# Patient Record
Sex: Female | Born: 1940 | Race: White | Hispanic: No | Marital: Married | Smoking: Never smoker
Health system: Southern US, Community
[De-identification: ages and names within clinical notes are randomized; demographics above are authoritative.]

## PROBLEM LIST (undated history)

## (undated) DIAGNOSIS — Z950 Presence of cardiac pacemaker: Secondary | ICD-10-CM

## (undated) DIAGNOSIS — R011 Cardiac murmur, unspecified: Secondary | ICD-10-CM

## (undated) DIAGNOSIS — M069 Rheumatoid arthritis, unspecified: Secondary | ICD-10-CM

## (undated) DIAGNOSIS — I4430 Unspecified atrioventricular block: Secondary | ICD-10-CM

## (undated) DIAGNOSIS — M797 Fibromyalgia: Secondary | ICD-10-CM

## (undated) DIAGNOSIS — S2239XA Fracture of one rib, unspecified side, initial encounter for closed fracture: Secondary | ICD-10-CM

## (undated) DIAGNOSIS — M1990 Unspecified osteoarthritis, unspecified site: Secondary | ICD-10-CM

## (undated) DIAGNOSIS — I10 Essential (primary) hypertension: Secondary | ICD-10-CM

## (undated) DIAGNOSIS — K219 Gastro-esophageal reflux disease without esophagitis: Secondary | ICD-10-CM

## (undated) DIAGNOSIS — Z8489 Family history of other specified conditions: Secondary | ICD-10-CM

## (undated) DIAGNOSIS — M810 Age-related osteoporosis without current pathological fracture: Secondary | ICD-10-CM

## (undated) DIAGNOSIS — I2510 Atherosclerotic heart disease of native coronary artery without angina pectoris: Secondary | ICD-10-CM

## (undated) DIAGNOSIS — F329 Major depressive disorder, single episode, unspecified: Secondary | ICD-10-CM

## (undated) DIAGNOSIS — I341 Nonrheumatic mitral (valve) prolapse: Secondary | ICD-10-CM

## (undated) DIAGNOSIS — Z87442 Personal history of urinary calculi: Secondary | ICD-10-CM

## (undated) DIAGNOSIS — F32A Depression, unspecified: Secondary | ICD-10-CM

## (undated) DIAGNOSIS — C439 Malignant melanoma of skin, unspecified: Secondary | ICD-10-CM

## (undated) HISTORY — DX: Depression, unspecified: F32.A

## (undated) HISTORY — PX: MELANOMA EXCISION: SHX5266

## (undated) HISTORY — PX: CARDIAC CATHETERIZATION: SHX172

## (undated) HISTORY — DX: Fracture of one rib, unspecified side, initial encounter for closed fracture: S22.39XA

## (undated) HISTORY — DX: Major depressive disorder, single episode, unspecified: F32.9

## (undated) HISTORY — PX: CARDIOVASCULAR STRESS TEST: SHX262

## (undated) HISTORY — DX: Nonrheumatic mitral (valve) prolapse: I34.1

## (undated) HISTORY — DX: Age-related osteoporosis without current pathological fracture: M81.0

## (undated) HISTORY — DX: Malignant melanoma of skin, unspecified: C43.9

## (undated) HISTORY — PX: CATARACT EXTRACTION W/ INTRAOCULAR LENS  IMPLANT, BILATERAL: SHX1307

## (undated) HISTORY — DX: Fibromyalgia: M79.7

## (undated) HISTORY — PX: COLONOSCOPY: SHX174

---

## 1997-12-23 ENCOUNTER — Other Ambulatory Visit: Admission: RE | Admit: 1997-12-23 | Discharge: 1997-12-23 | Payer: Self-pay | Admitting: Obstetrics and Gynecology

## 1999-02-05 ENCOUNTER — Other Ambulatory Visit: Admission: RE | Admit: 1999-02-05 | Discharge: 1999-02-05 | Payer: Self-pay | Admitting: Obstetrics and Gynecology

## 2000-02-15 ENCOUNTER — Other Ambulatory Visit: Admission: RE | Admit: 2000-02-15 | Discharge: 2000-02-15 | Payer: Self-pay | Admitting: Obstetrics and Gynecology

## 2001-02-20 ENCOUNTER — Other Ambulatory Visit: Admission: RE | Admit: 2001-02-20 | Discharge: 2001-02-20 | Payer: Self-pay | Admitting: Obstetrics and Gynecology

## 2002-02-26 ENCOUNTER — Other Ambulatory Visit: Admission: RE | Admit: 2002-02-26 | Discharge: 2002-02-26 | Payer: Self-pay | Admitting: Obstetrics and Gynecology

## 2002-11-09 ENCOUNTER — Encounter: Admission: RE | Admit: 2002-11-09 | Discharge: 2002-11-09 | Payer: Self-pay | Admitting: Family Medicine

## 2002-11-09 ENCOUNTER — Encounter: Payer: Self-pay | Admitting: Family Medicine

## 2002-12-24 ENCOUNTER — Encounter: Admission: RE | Admit: 2002-12-24 | Discharge: 2002-12-24 | Payer: Self-pay | Admitting: Internal Medicine

## 2002-12-24 ENCOUNTER — Encounter: Payer: Self-pay | Admitting: Internal Medicine

## 2003-03-18 ENCOUNTER — Other Ambulatory Visit: Admission: RE | Admit: 2003-03-18 | Discharge: 2003-03-18 | Payer: Self-pay | Admitting: Obstetrics and Gynecology

## 2003-03-27 ENCOUNTER — Emergency Department (HOSPITAL_COMMUNITY): Admission: EM | Admit: 2003-03-27 | Discharge: 2003-03-28 | Payer: Self-pay | Admitting: Emergency Medicine

## 2003-03-30 ENCOUNTER — Ambulatory Visit (HOSPITAL_COMMUNITY): Admission: RE | Admit: 2003-03-30 | Discharge: 2003-03-30 | Payer: Self-pay | Admitting: Internal Medicine

## 2003-06-02 ENCOUNTER — Encounter: Admission: RE | Admit: 2003-06-02 | Discharge: 2003-06-02 | Payer: Self-pay | Admitting: Internal Medicine

## 2003-09-27 ENCOUNTER — Encounter: Admission: RE | Admit: 2003-09-27 | Discharge: 2003-09-27 | Payer: Self-pay | Admitting: Obstetrics and Gynecology

## 2003-11-14 HISTORY — PX: BREAST BIOPSY: SHX20

## 2003-12-02 ENCOUNTER — Encounter (INDEPENDENT_AMBULATORY_CARE_PROVIDER_SITE_OTHER): Payer: Self-pay | Admitting: Specialist

## 2003-12-02 ENCOUNTER — Ambulatory Visit (HOSPITAL_BASED_OUTPATIENT_CLINIC_OR_DEPARTMENT_OTHER): Admission: RE | Admit: 2003-12-02 | Discharge: 2003-12-02 | Payer: Self-pay | Admitting: Surgery

## 2003-12-02 ENCOUNTER — Ambulatory Visit (HOSPITAL_COMMUNITY): Admission: RE | Admit: 2003-12-02 | Discharge: 2003-12-02 | Payer: Self-pay | Admitting: Surgery

## 2004-03-30 ENCOUNTER — Other Ambulatory Visit: Admission: RE | Admit: 2004-03-30 | Discharge: 2004-03-30 | Payer: Self-pay | Admitting: Obstetrics and Gynecology

## 2005-01-15 ENCOUNTER — Encounter: Admission: RE | Admit: 2005-01-15 | Discharge: 2005-01-15 | Payer: Self-pay | Admitting: Family Medicine

## 2005-03-29 ENCOUNTER — Other Ambulatory Visit: Admission: RE | Admit: 2005-03-29 | Discharge: 2005-03-29 | Payer: Self-pay | Admitting: Obstetrics and Gynecology

## 2005-07-03 ENCOUNTER — Ambulatory Visit: Admission: RE | Admit: 2005-07-03 | Discharge: 2005-07-03 | Payer: Self-pay | Admitting: Obstetrics and Gynecology

## 2005-07-05 ENCOUNTER — Encounter: Admission: RE | Admit: 2005-07-05 | Discharge: 2005-07-05 | Payer: Self-pay | Admitting: Family Medicine

## 2005-09-26 ENCOUNTER — Encounter: Admission: RE | Admit: 2005-09-26 | Discharge: 2005-09-26 | Payer: Self-pay | Admitting: Family Medicine

## 2006-04-11 ENCOUNTER — Other Ambulatory Visit: Admission: RE | Admit: 2006-04-11 | Discharge: 2006-04-11 | Payer: Self-pay | Admitting: Obstetrics & Gynecology

## 2007-04-13 ENCOUNTER — Other Ambulatory Visit: Admission: RE | Admit: 2007-04-13 | Discharge: 2007-04-13 | Payer: Self-pay | Admitting: Obstetrics and Gynecology

## 2008-05-31 ENCOUNTER — Other Ambulatory Visit: Admission: RE | Admit: 2008-05-31 | Discharge: 2008-05-31 | Payer: Self-pay | Admitting: Obstetrics & Gynecology

## 2009-05-09 ENCOUNTER — Emergency Department (HOSPITAL_COMMUNITY): Admission: EM | Admit: 2009-05-09 | Discharge: 2009-05-09 | Payer: Self-pay | Admitting: Emergency Medicine

## 2009-12-14 ENCOUNTER — Ambulatory Visit: Payer: Self-pay | Admitting: Cardiology

## 2010-07-02 LAB — URINALYSIS, ROUTINE W REFLEX MICROSCOPIC
Bilirubin Urine: NEGATIVE
Glucose, UA: NEGATIVE mg/dL
Ketones, ur: NEGATIVE mg/dL
Leukocytes, UA: NEGATIVE
Nitrite: NEGATIVE
Protein, ur: NEGATIVE mg/dL
Specific Gravity, Urine: 1.021 (ref 1.005–1.030)
Urobilinogen, UA: 0.2 mg/dL (ref 0.0–1.0)
pH: 6 (ref 5.0–8.0)

## 2010-07-02 LAB — COMPREHENSIVE METABOLIC PANEL
ALT: 17 U/L (ref 0–35)
AST: 32 U/L (ref 0–37)
Albumin: 3.9 g/dL (ref 3.5–5.2)
Alkaline Phosphatase: 55 U/L (ref 39–117)
BUN: 18 mg/dL (ref 6–23)
CO2: 24 mEq/L (ref 19–32)
Calcium: 9.2 mg/dL (ref 8.4–10.5)
Chloride: 106 mEq/L (ref 96–112)
Creatinine, Ser: 0.75 mg/dL (ref 0.4–1.2)
GFR calc Af Amer: >60 mL/min (ref >60)
GFR calc non Af Amer: >60 mL/min (ref >60)
Glucose, Bld: 123 mg/dL — ABNORMAL HIGH (ref 70–99)
Potassium: 4.1 mEq/L (ref 3.5–5.1)
Sodium: 140 mEq/L (ref 135–145)
Total Bilirubin: 1.1 mg/dL (ref 0.3–1.2)
Total Protein: 7.4 g/dL (ref 6.0–8.3)

## 2010-07-02 LAB — CBC
HCT: 44.4 % (ref 36.0–46.0)
Hemoglobin: 15 g/dL (ref 12.0–15.0)
MCHC: 33.8 g/dL (ref 30.0–36.0)
MCV: 89.5 fL (ref 78.0–100.0)
Platelets: 188 10*3/uL (ref 150–400)
RBC: 4.96 MIL/uL (ref 3.87–5.11)
RDW: 12.9 % (ref 11.5–15.5)
WBC: 6.2 10*3/uL (ref 4.0–10.5)

## 2010-07-02 LAB — DIFFERENTIAL
Basophils Absolute: 0 10*3/uL (ref 0.0–0.1)
Basophils Relative: 1 % (ref 0–1)
Eosinophils Absolute: 0.1 10*3/uL (ref 0.0–0.7)
Eosinophils Relative: 2 % (ref 0–5)
Lymphocytes Relative: 46 % (ref 12–46)
Lymphs Abs: 2.9 10*3/uL (ref 0.7–4.0)
Monocytes Absolute: 0.6 10*3/uL (ref 0.1–1.0)
Monocytes Relative: 10 % (ref 3–12)
Neutro Abs: 2.6 10*3/uL (ref 1.7–7.7)
Neutrophils Relative %: 42 % — ABNORMAL LOW (ref 43–77)

## 2010-07-02 LAB — WET PREP, GENITAL
Clue Cells Wet Prep HPF POC: NONE SEEN
Trich, Wet Prep: NONE SEEN
Yeast Wet Prep HPF POC: NONE SEEN

## 2010-07-02 LAB — URINE CULTURE
Colony Count: NO GROWTH
Culture: NO GROWTH

## 2010-07-02 LAB — URINE MICROSCOPIC-ADD ON

## 2010-07-02 LAB — LIPASE, BLOOD: Lipase: 44 U/L (ref 11–59)

## 2010-08-31 NOTE — Op Note (Signed)
[name], Elizabeth                         ACCOUNT NO.:  [id number]   MEDICAL RECORD NO.:  [id number]                   PATIENT TYPE:  AMB   LOCATION:  DSC                                  FACILITY:  [facility]   PHYSICIAN:  Sandria Bales. Ezzard Standing, M.D.               DATE OF BIRTH:  [birth year]   DATE OF PROCEDURE:  12/02/2003  DATE OF DISCHARGE:                                 OPERATIVE REPORT   PREOPERATIVE DIAGNOSIS:  A 1 cm mass at the edge of left areola at the 3  o'clock position.   POSTOPERATIVE DIAGNOSIS:  A 1 cm mass at the edge of left areola at the 3  o'clock position.   OPERATION:  Excision of left breast mass.   SURGEON:  Sandria Bales. Ezzard Standing, M.D.   FIRST ASSISTANT:  None.   ANESTHESIA:  Approximately 60 mL of 1% Xylocaine.   COMPLICATIONS:  None.   INDICATIONS FOR PROCEDURE:  Elizabeth Estrada is a 70 year old white female who  has had a mass in her left breast at the 3 o'clock position that is right on  the edge of the areola.  This has gotten larger and then gotten smaller and  appears to be a superficial cyst or abscess that has partially resolved.  [facility] which was negative for  any clear parenchymal disease.  I discussed with the patient that I felt she  would be best having this excised, it is probably some infected duct or cyst  that will probably keep recurring and she now comes for excision of this.   DESCRIPTION OF PROCEDURE:  With the patient in the supine position, her left  breast was prepped with Betadine solution and sterilely draped.  Using 1%  Xylocaine with epinephrine, I used about 6 mL to inject as a local  anesthetic.  I excised the cyst and excised a piece of the skin over the  cyst along the 3 o'clock position of her areola on her left breast.  This  mass was superficial, really more on the skin than it was in the breast  tissue.  I sent it to pathology.  There was no other palpable mass.  I  closed the wound with  5-0 Vicryl suture, painted it with tincture of Benzoin  and placed Steri-Strips. She will be seen back in two weeks for follow up on  pathology and check her wound.                                               Sandria Bales. Ezzard Standing, M.D.   DHN/MEDQ  D:  12/02/2003  T:  12/03/2003  Job:  [id number]   cc:   Laqueta Linden, M.D.  [address]  [place]  [place] [address]  Fax: [phone]

## 2011-05-15 ENCOUNTER — Telehealth: Payer: Self-pay | Admitting: Cardiology

## 2011-05-15 ENCOUNTER — Encounter: Payer: Self-pay | Admitting: Nurse Practitioner

## 2011-05-15 NOTE — Telephone Encounter (Signed)
Patient called stating she has been having chest tightness off and on for the past 2 to 3 weeks.Appointment scheduled with Norma Fredrickson NP 2/1 13 at 9:30 am.

## 2011-05-15 NOTE — Telephone Encounter (Signed)
New msg: Pt calling c/o chest pressure off and on for the past week. Pt not c/o any other symptoms. Please return pt call to discuss further.

## 2011-05-17 ENCOUNTER — Ambulatory Visit (INDEPENDENT_AMBULATORY_CARE_PROVIDER_SITE_OTHER): Payer: BC Managed Care – PPO | Admitting: Nurse Practitioner

## 2011-05-17 ENCOUNTER — Encounter: Payer: Self-pay | Admitting: Nurse Practitioner

## 2011-05-17 VITALS — BP 138/74 | HR 61 | Ht 62.0 in | Wt 121.8 lb

## 2011-05-17 DIAGNOSIS — I341 Nonrheumatic mitral (valve) prolapse: Secondary | ICD-10-CM

## 2011-05-17 DIAGNOSIS — R0789 Other chest pain: Secondary | ICD-10-CM | POA: Insufficient documentation

## 2011-05-17 DIAGNOSIS — I050 Rheumatic mitral stenosis: Secondary | ICD-10-CM | POA: Insufficient documentation

## 2011-05-17 DIAGNOSIS — R079 Chest pain, unspecified: Secondary | ICD-10-CM

## 2011-05-17 DIAGNOSIS — I059 Rheumatic mitral valve disease, unspecified: Secondary | ICD-10-CM

## 2011-05-17 NOTE — Assessment & Plan Note (Signed)
Patient presents with midsternal chest pain. Not exertional in nature. Does have positive family history with her parents having CAD in their 42's. She does have MVP. Will update her echo and arrange for stress echo as well. Further disposition to follow. She is on aspirin therapy. No other changes in her medicines today. Patient is agreeable to this plan and will call if any problems develop in the interim.

## 2011-05-17 NOTE — Progress Notes (Signed)
   Elizabeth Estrada Date of Birth: [birth year] Medical Record #[id number]  History of Present Illness: Elizabeth Estrada is seen today for a work in visit. She is seen for Dr. [place]. She has a history of MVP. She has not been seen since 2011. She comes in today because of concerns of chest pain. She notes that for the past few weeks her chest feels tight. It is in the midsternal region. It will just come on and then go away. No real pattern. No aggravating or alleviating factors. She denies being short of breath, nauseated or diaphoretic. No radiation of the discomfort. It will last for less than 5 minutes. Not exertional in nature but she admits that she has not been as active due to the weather. She continues to have some occasional palpitations and dizzy spells. Her family history is positive for CAD. She never really smoked. She has had cholesterol levels done back in November and those readings were ok. She is on low dose aspirin. She had remote stress testing in 2001 and last echo in 2006.  Current Outpatient Prescriptions on File Prior to Visit  Medication Sig Dispense Refill  . aspirin 81 MG tablet Take 81 mg by mouth every other day.       Marland Kitchen CALCIUM PO Take by mouth daily.      Marland Kitchen escitalopram (LEXAPRO) 10 MG tablet Take 10 mg by mouth daily.      Marland Kitchen etanercept (ENBREL) 50 MG/ML injection Inject 50 mg into the skin once a week.      . Multiple Vitamin (MULTIVITAMIN) tablet Take 1 tablet by mouth daily.        No Known Allergies  Past Medical History  Diagnosis Date  . MVP (mitral valve prolapse)   . Rheumatoid arthritis     Past Surgical History  Procedure Date  . Removal of cyst from her left breast   . US echocardiography 06/04/2004    EF 60-65%  . Cardiovascular stress test 10/19/1999    EF 97%, NO EVIDENCE OF ISCHEMIA    History  Smoking status  . Never Smoker   Smokeless tobacco  . Not on file    History  Alcohol Use No    Family History  Problem Relation Age of Onset    . Coronary artery disease Mother   . Pancreatic cancer Father     Review of Systems: The review of systems is per the HPI.  All other systems were reviewed and are negative.  Physical Exam: BP 138/74  Pulse 61  Ht 5\' 2"  (1.575 m)  Wt 121 lb 12.8 oz (55.248 kg)  BMI 22.28 kg/m2 Patient is very pleasant and in no acute distress. She looks younger than her stated age. Skin is warm and dry. Color is normal.  HEENT is unremarkable. Normocephalic/atraumatic. PERRL. Sclera are nonicteric. Neck is supple. No masses. No JVD. Lungs are clear. Cardiac exam shows a regular rate and rhythm. She does have a soft systolic murmur. No click that I could appreciate. Abdomen is soft. Extremities are without edema. Gait and ROM are intact. No gross neurologic deficits noted.   LABORATORY DATA: EKG today shows sinus rhythm. Tracing is unchanged from prior EKG.   Assessment / Plan:

## 2011-05-17 NOTE — Assessment & Plan Note (Signed)
Will update her echo.  

## 2011-05-17 NOTE — Patient Instructions (Addendum)
We are going to arrange for an ultrasound of your heart along with a stress test.  Stay on your current medicines.

## 2011-05-23 ENCOUNTER — Telehealth: Payer: Self-pay | Admitting: Cardiology

## 2011-05-23 NOTE — Telephone Encounter (Signed)
New problem Pt was calling about what portion she will have to pay for the tests she has scheduled. Please call her back

## 2011-05-29 ENCOUNTER — Ambulatory Visit (HOSPITAL_COMMUNITY): Payer: BC Managed Care – PPO | Attending: Nurse Practitioner

## 2011-05-29 ENCOUNTER — Other Ambulatory Visit: Payer: Self-pay

## 2011-05-29 DIAGNOSIS — Z8249 Family history of ischemic heart disease and other diseases of the circulatory system: Secondary | ICD-10-CM | POA: Insufficient documentation

## 2011-05-29 DIAGNOSIS — R072 Precordial pain: Secondary | ICD-10-CM

## 2011-05-29 DIAGNOSIS — R079 Chest pain, unspecified: Secondary | ICD-10-CM | POA: Insufficient documentation

## 2011-05-29 DIAGNOSIS — I341 Nonrheumatic mitral (valve) prolapse: Secondary | ICD-10-CM

## 2011-05-30 ENCOUNTER — Other Ambulatory Visit (HOSPITAL_COMMUNITY): Payer: BC Managed Care – PPO

## 2011-05-31 ENCOUNTER — Other Ambulatory Visit (HOSPITAL_COMMUNITY): Payer: BC Managed Care – PPO

## 2011-05-31 ENCOUNTER — Ambulatory Visit (HOSPITAL_BASED_OUTPATIENT_CLINIC_OR_DEPARTMENT_OTHER): Payer: BC Managed Care – PPO | Admitting: Radiology

## 2011-05-31 ENCOUNTER — Ambulatory Visit (HOSPITAL_COMMUNITY): Payer: BC Managed Care – PPO | Attending: Cardiovascular Disease

## 2011-05-31 DIAGNOSIS — Z8249 Family history of ischemic heart disease and other diseases of the circulatory system: Secondary | ICD-10-CM | POA: Insufficient documentation

## 2011-05-31 DIAGNOSIS — R079 Chest pain, unspecified: Secondary | ICD-10-CM | POA: Insufficient documentation

## 2011-05-31 DIAGNOSIS — R42 Dizziness and giddiness: Secondary | ICD-10-CM | POA: Insufficient documentation

## 2011-05-31 DIAGNOSIS — R072 Precordial pain: Secondary | ICD-10-CM

## 2011-05-31 DIAGNOSIS — R0989 Other specified symptoms and signs involving the circulatory and respiratory systems: Secondary | ICD-10-CM

## 2011-05-31 DIAGNOSIS — R002 Palpitations: Secondary | ICD-10-CM | POA: Insufficient documentation

## 2011-05-31 DIAGNOSIS — I341 Nonrheumatic mitral (valve) prolapse: Secondary | ICD-10-CM

## 2011-05-31 DIAGNOSIS — I359 Nonrheumatic aortic valve disorder, unspecified: Secondary | ICD-10-CM | POA: Insufficient documentation

## 2011-09-24 ENCOUNTER — Encounter: Payer: Self-pay | Admitting: Cardiology

## 2013-02-25 ENCOUNTER — Telehealth: Payer: Self-pay | Admitting: Nurse Practitioner

## 2013-02-25 DIAGNOSIS — N83209 Unspecified ovarian cyst, unspecified side: Secondary | ICD-10-CM

## 2013-02-25 NOTE — Telephone Encounter (Signed)
Reviewed chart.  We can probably see what we need with a transabdominal u/s.  She will just need to come with a full bladder so drink 16 oz water before she comes.  She is thin.

## 2013-02-25 NOTE — Telephone Encounter (Signed)
Office is calling to schedule an appt for pat because they found large ovarian cyst on the right side. York Spaniel they will send the disc.

## 2013-02-25 NOTE — Telephone Encounter (Signed)
I spoke with [facility] and obtain copy of CT scan. Noted: Cystic R adnexal lesion is increased mildly in size over time. Recommend Pelvic U/S for further characterization.  I called patient to attempt to schedule and she is hesitant to schedule PUS because she states that she attempted one here in office before and was unable to tolerate vaginal ultrasound. Noted in 2007 paper chart U/S was limited due to patient tolerance.   She is requesting to wait to see Patty at [facility] scheduled for 11/25.   Chart back to Lauro Franklin, FNP office to sign off on CT reading.

## 2013-02-26 NOTE — Telephone Encounter (Signed)
LVm advised patient to drink 16 oz of water before her appointment and to not use the restroom in between drinking the water and having her scan.

## 2013-02-26 NOTE — Telephone Encounter (Signed)
Patient is scheduled for 830 on 11/20 for a transabdominal u/s with Dr. Hyacinth Meeker. Will you place an order please?

## 2013-02-26 NOTE — Telephone Encounter (Signed)
LMTCB to schedule transabdominal ultrasound per MSM.

## 2013-02-26 NOTE — Telephone Encounter (Signed)
Complete gyn?  Is that what I need to enter?

## 2013-02-26 NOTE — Telephone Encounter (Signed)
Carolynn, can you schedule?

## 2013-03-04 ENCOUNTER — Ambulatory Visit (INDEPENDENT_AMBULATORY_CARE_PROVIDER_SITE_OTHER): Payer: BC Managed Care – PPO

## 2013-03-04 ENCOUNTER — Ambulatory Visit (INDEPENDENT_AMBULATORY_CARE_PROVIDER_SITE_OTHER): Payer: BC Managed Care – PPO | Admitting: Obstetrics & Gynecology

## 2013-03-04 VITALS — BP 120/80 | Ht 62.5 in | Wt 124.4 lb

## 2013-03-04 DIAGNOSIS — N83201 Unspecified ovarian cyst, right side: Secondary | ICD-10-CM

## 2013-03-04 DIAGNOSIS — R9389 Abnormal findings on diagnostic imaging of other specified body structures: Secondary | ICD-10-CM

## 2013-03-04 DIAGNOSIS — D219 Benign neoplasm of connective and other soft tissue, unspecified: Secondary | ICD-10-CM

## 2013-03-04 DIAGNOSIS — D259 Leiomyoma of uterus, unspecified: Secondary | ICD-10-CM

## 2013-03-04 DIAGNOSIS — N83209 Unspecified ovarian cyst, unspecified side: Secondary | ICD-10-CM

## 2013-03-04 NOTE — Progress Notes (Signed)
72 y.o.Marriedfemale here for a pelvic ultrasound due to hx of cystic adnexal mass noted on CT at [facility].  Pt has CD with her but I cannot read results no CT.  Pt was told she had a cystic area on her ovary/tube that was enlarged from prior CT scans.   Pt has transvaginal u/s several years ago and refused to have another one due to discomfort.  Pt had on transabdominal u/s today.  She did come without voiding but bladder wasn't full enough so she had to drink more water and be re-imaged after 30 minutes.  No LMP recorded. Patient is postmenopausal.  FINDINGS: UTERUS: 6.8 x 4.1 x 2.4cm with possible 14mm fibroid.  This was difficult due to transabdominal u/s EMS: 7.4, slightly thickened ADNEXA:   Left ovary 1.8 x 1.5 x 1.1cm   Right ovary 2.0 x 2.1 x 2.2cm with multicystic, avascular mass measuring 29 x 21 x 23mm.  Largest cyst is 10mm CUL DE SAC: no free fluid  Images shown to patient.  Area on ovary is small and avascular.  Does not appear worrisome.  I do need the CT report so that I can be sure what was seen was on the right side and of similar size.  Pt aware I will need to get back in touch with her.  She denies any bleeding.  May need endometrial biopsy but will get CT report first.  Pt in agreement.  Assessment:  Thickened endometrium on transabd U/S, multicystic/avascular right adnexal mass, probable 14mm fibroid  Plan: Get CT report.  May need to proceed with additional testing/follow-up.  Pt aware I will contact here with additional information.  ~15 minutes spent with patient >50% of time was in face to face discussion of above.  (addendum:  03/08/13--outside records obtained.  CT shows right adnexa measuring 2.5cm.  This is consistent with findings on ultrasound.  Has enlarged from 2.0cm since 05/09/09.)

## 2013-03-07 ENCOUNTER — Encounter: Payer: Self-pay | Admitting: Obstetrics & Gynecology

## 2013-03-07 NOTE — Patient Instructions (Signed)
I will contact you after I have gotten the CT scan results.

## 2013-03-08 ENCOUNTER — Telehealth: Payer: Self-pay | Admitting: *Deleted

## 2013-03-08 NOTE — Telephone Encounter (Signed)
Patient notified of Dr Rondel Baton instruction.  Recommend follow-up in one year. Recommend appt for endometrial biopsy.  Patient states she has a very hard time with exams.  She is scheduled for [facility] tomorrow with Patty and she will discuss these with Patty and then schedule them.  Interested in something to help her tolerate procedure stating "just put me to sleep."  Advised I can check with Dr Hyacinth Meeker about this but will need driver.

## 2013-03-08 NOTE — Telephone Encounter (Signed)
Patient is returning a call to Sally. °

## 2013-03-08 NOTE — Telephone Encounter (Signed)
Calling patient with results from Dr Hyacinth Meeker.

## 2013-03-08 NOTE — Telephone Encounter (Signed)
Message copied by Alisa Graff on Mon Mar 08, 2013  2:34 PM ------      Message from: Jerene Bears      Created: Mon Mar 08, 2013  6:08 AM       Kennon Rounds      Please call pt.  Saw her last week for adnexal mass seen on CT.  I had to get CT results for comparison.  Got report.  Ovary on right is same size as on CT.  Probably should repeat in one year.  I can either put in my reminders or can you go ahead and put on schedule?  Needs Trans ABD u/s.  She needs to return for attempt at an endometrial biopsy.  Can do after Thanksgiving.  Can you schedule?  Thanks. ------

## 2013-03-09 ENCOUNTER — Ambulatory Visit (INDEPENDENT_AMBULATORY_CARE_PROVIDER_SITE_OTHER): Payer: BC Managed Care – PPO | Admitting: Nurse Practitioner

## 2013-03-09 ENCOUNTER — Encounter: Payer: Self-pay | Admitting: Nurse Practitioner

## 2013-03-09 VITALS — BP 126/64 | HR 60 | Resp 16 | Ht 62.5 in | Wt 121.0 lb

## 2013-03-09 DIAGNOSIS — Z01419 Encounter for gynecological examination (general) (routine) without abnormal findings: Secondary | ICD-10-CM

## 2013-03-09 DIAGNOSIS — Z0000 Encounter for general adult medical examination without abnormal findings: Secondary | ICD-10-CM

## 2013-03-09 NOTE — Progress Notes (Signed)
Patient ID: Elizabeth Estrada, female   DOB: [birth year], 72 y.o.   MRN: [id number] 72 y.o. G0P0 Married Caucasian Fe here for annual exam.  She has been followed for possible fibroid on PUS and will need to have an endo biopsy.  She is so small and atrophic that even pap and pelvic exams are uncomfortable.  No LMP recorded. Patient is postmenopausal.          Sexually active: no  The current method of family planning is abstinence.    Exercising: yes  walking occasionally Smoker:  no  Health Maintenance: Pap:  09/15/09, WNL MMG:  04/20/12, Bi-Rads 1: negative Colonoscopy:  2007, repeat 5-10 years  BMD:   10/2008, -1.0/-2.1/-2.0 TDaP:  2012 Labs: PCP   reports that she has never smoked. She has never used smokeless tobacco. She reports that she does not drink alcohol or use illicit drugs.  Past Medical History  Diagnosis Date  . MVP (mitral valve prolapse)   . Rheumatoid arthritis(714.0)     Past Surgical History  Procedure Laterality Date  . Removal of cyst from her left breast Left 8/05    benign - Dr. Jamey Ripa  . US echocardiography  06/04/2004    EF 60-65%  . Cardiovascular stress test  10/19/1999    EF 97%, NO EVIDENCE OF ISCHEMIA    Current Outpatient Prescriptions  Medication Sig Dispense Refill  . aspirin 81 MG tablet Take 81 mg by mouth every other day.       Marland Kitchen CALCIUM PO Take by mouth daily.      Marland Kitchen escitalopram (LEXAPRO) 10 MG tablet Take 10 mg by mouth daily.      Marland Kitchen etanercept (ENBREL) 50 MG/ML injection Inject 50 mg into the skin once a week.      . Multiple Vitamin (MULTIVITAMIN) tablet Take 1 tablet by mouth daily.       No current facility-administered medications for this visit.    Family History  Problem Relation Age of Onset  . Coronary artery disease Mother   . Pancreatic cancer Father     ROS:  Pertinent items are noted in HPI.  Otherwise, a comprehensive ROS was negative.  Exam:   BP 126/64  Pulse 60  Resp 16  Ht 5' 2.5" (1.588 m)  Wt 121 lb (54.885  kg)  BMI 21.76 kg/m2 Height: 5' 2.5" (158.8 cm)  Ht Readings from Last 3 Encounters:  03/09/13 5' 2.5" (1.588 m)  03/04/13 5' 2.5" (1.588 m)  05/17/11 5\' 2"  (1.575 m)    General appearance: alert, cooperative and appears stated age.  After pelvic exam she was more emotional and tearful. Head: Normocephalic, without obvious abnormality, atraumatic Neck: no adenopathy, supple, symmetrical, trachea midline and thyroid normal to inspection and palpation Lungs: clear to auscultation bilaterally Breasts: normal appearance, no masses or tenderness Heart: regular rate and rhythm Abdomen: soft, non-tender; no masses,  no organomegaly Extremities: extremities normal, atraumatic, no cyanosis or edema Skin: Skin color, texture, turgor normal. No rashes or lesions Lymph nodes: Cervical, supraclavicular, and axillary nodes normal. No abnormal inguinal nodes palpated Neurologic: Grossly normal   Pelvic: External genitalia:  no lesions              Urethra:  normal appearing urethra with no masses, tenderness or lesions              Bartholin's and Skene's: normal                 Vagina: normal appearing  vagina with normal color and discharge, no lesions              Cervix: anteverted              Pap taken: no Bimanual Exam:  Uterus:  normal size, contour, position, consistency, mobility, non-tender exam is limited with patient unable to tolerate just a finger exam              Adnexa: no mass, fullness, tenderness               Rectovaginal: Confirms               Anus:  normal sphincter tone, no lesions  A:  Well Woman with normal exam  Postmenopausal with atrophic vaginitis  Possible uterine fibroid   Needs endometrial biopsy  P:   Pap smear as per guidelines Not done  Mammogram due 1/15  Will be followed by Dr. Hyacinth Meeker for endo biopsy - she will need prop med's counseled on breast self exam, mammography screening, adequate intake of calcium and vitamin D, diet and exercise, Kegel's  exercises return annually or prn  An After Visit Summary was printed and given to the patient.

## 2013-03-09 NOTE — Patient Instructions (Signed)

## 2013-03-10 NOTE — Progress Notes (Signed)
Reviewed personally.  M. Suzanne Miller, MD.  

## 2013-03-10 NOTE — Progress Notes (Signed)
Encounter reviewed by Dr. Brook Silva.  

## 2013-03-18 NOTE — Telephone Encounter (Signed)
Can you follow up with pt.  Nothing in PG's note about this.

## 2013-03-19 NOTE — Telephone Encounter (Signed)
Follow up call to patient, LMTCB. 

## 2013-03-25 ENCOUNTER — Telehealth: Payer: Self-pay | Admitting: *Deleted

## 2013-03-25 NOTE — Telephone Encounter (Signed)
See next phone note for follow up.   Routing to provider for final review. Patient agreeable to disposition. Will close encounter

## 2013-03-25 NOTE — Telephone Encounter (Signed)
See previous phone note.  Calling patient to schedule endo biopsy.  Also needs to schedule follow up PUS Nov 2015.  LMTCB.

## 2013-05-03 NOTE — Telephone Encounter (Signed)
Elizabeth Estrada, Please assist in calling and scheduling.

## 2013-05-07 ENCOUNTER — Telehealth: Payer: Self-pay | Admitting: Obstetrics & Gynecology

## 2013-05-07 NOTE — Telephone Encounter (Signed)
lmtcb

## 2013-05-24 ENCOUNTER — Telehealth: Payer: Self-pay | Admitting: *Deleted

## 2013-05-24 NOTE — Telephone Encounter (Signed)
See previous phone note (closed in error before completion).  Patient needs Endo biopsy per Dr Sabra Heck.  Have called patient several times to schedule with no response.  Call to patient again today and left message.  Gabriel Cirri, please call patient to follow up.  If no return call by end of week, need to send to Dr Sabra Heck and see about next step.

## 2013-05-24 NOTE — Telephone Encounter (Signed)
Spoke with patient. Message below given and advised we were following up to schedule endometrial biopsy and patient states "I have decided not to do that."  She states that her pain has decreased and denies any vaginal bleeding. Advised patient to call back with any further symptoms and patient agreeable. Advised I would let Milford Cage, FNP and Dr. Sabra Heck know about patient's decision.

## 2013-05-24 NOTE — Telephone Encounter (Signed)
Call to patient, LM calling to assist in scheduling appointment.   Gabriel Cirri, please call her again later this week.  If she continues not to call we need to let Dr Sabra Heck know and see if we need to send letter.

## 2013-05-31 ENCOUNTER — Encounter: Payer: Self-pay | Admitting: Obstetrics & Gynecology

## 2013-05-31 NOTE — Telephone Encounter (Signed)
Re-routing to providers. Patient declines to schedule Endo BX.  Have attempted multiple times to contact her.  Please advise of next step.

## 2013-05-31 NOTE — Telephone Encounter (Signed)
Letter written.  Send certified mail.  Close encounter.

## 2013-06-04 ENCOUNTER — Telehealth: Payer: Self-pay | Admitting: Obstetrics & Gynecology

## 2013-06-04 DIAGNOSIS — R9389 Abnormal findings on diagnostic imaging of other specified body structures: Secondary | ICD-10-CM

## 2013-06-04 NOTE — Telephone Encounter (Signed)
Message left to return call to [place] at [phone] on both home and mobile numbers.   Endo bx ordered for precert.

## 2013-06-04 NOTE — Telephone Encounter (Signed)
Patient calling to speak with nurse about a letter from Dr. Sabra Heck about needing an endometrial biopsy. Patient wants to go ahead and schedule. However, the patient states she is anxious and hopes "Dr. Sabra Heck will call something in to help" her with this.  [facility] [address].

## 2013-06-04 NOTE — Telephone Encounter (Signed)
Call to patient, scheduled endo bx for 06-11-13 at 200.  Patient states she discussed with Dr Sabra Heck the anxiety she has about this and needs something to relax her.  Advised she will need to come early and sign consent and have driver to take her home.  Patient agreeable.  Is this ok for Ativan 1 mg on arrival or would you like something else?

## 2013-06-04 NOTE — Telephone Encounter (Signed)
Patient is calling Tracy back °

## 2013-06-04 NOTE — Telephone Encounter (Signed)
Why don't you call in Valium 5mg  and she can take one hour before she comes.  Have her come by and sign her consent form early.  Call in #5.  If she needs ativan, we can give it the same day as well.

## 2013-06-07 ENCOUNTER — Other Ambulatory Visit: Payer: Self-pay | Admitting: Orthopedic Surgery

## 2013-06-07 MED ORDER — DIAZEPAM 5 MG PO TABS: 5.0000 mg | ORAL_TABLET | Freq: Once | ORAL | Status: DC

## 2013-06-07 NOTE — Telephone Encounter (Signed)
LMTCB

## 2013-06-07 NOTE — Telephone Encounter (Signed)
[facility] to [facility]. Spoke with Zenia Resides, pharmacist.  Rx for diazepam 5 mg PO take 1 an hour before procedure. Disp 5 with 0 refills.

## 2013-06-11 ENCOUNTER — Ambulatory Visit (INDEPENDENT_AMBULATORY_CARE_PROVIDER_SITE_OTHER): Payer: BC Managed Care – PPO | Admitting: Obstetrics & Gynecology

## 2013-06-11 VITALS — BP 128/62 | HR 64 | Resp 12 | Ht 62.5 in | Wt 125.8 lb

## 2013-06-11 DIAGNOSIS — R9389 Abnormal findings on diagnostic imaging of other specified body structures: Secondary | ICD-10-CM

## 2013-06-11 MED ORDER — ALPRAZOLAM 0.25 MG PO TABS: 0.2500 mg | ORAL_TABLET | Freq: Three times a day (TID) | ORAL | Status: DC | PRN

## 2013-06-11 NOTE — Progress Notes (Signed)
Subjective:     Patient ID: Elizabeth Estrada, female   DOB: 20-Jul-1940, 73 y.o.   MRN: [id number]  HPI 73 yo G0 MWF here for endometrial biopsy due to thickened endometrium on ultrasound.  This was done due to pelvic pain/cramping that the patient has experienced for the last several months.   Review of Systems  All other systems reviewed and are negative.       Objective:   Physical Exam  Constitutional: She is oriented to person, place, and time. She appears well-developed and well-nourished.  Genitourinary: Uterus normal. Cervix exhibits no motion tenderness. No bleeding around the vagina. No vaginal discharge found.  Musculoskeletal: Normal range of motion.  Lymphadenopathy:       Right: No inguinal adenopathy present.       Left: No inguinal adenopathy present.  Neurological: She is alert and oriented to person, place, and time.  Skin: Skin is warm and dry.   Endometrial biopsy recommended.  Discussed with patient.  Verbal and written consent obtained.   Procedure:  Speculum placed.  Cervix visualized and cleansed with betadine prep.  A single toothed tenaculum was applied to the anterior lip of the cervix.  Paracervical block with 1% Lidocaine instilled.  5cc instilled.  Cervix dilated with Milex dilator.  Endometrial pipelle was advanced through the cervix into the endometrial cavity without difficulty.  Pipelle passed to 6cm.  Suction applied and pipelle removed with good tissue sample obtained.  Pipelle passed twice. Tenculum removed.  No bleeding noted.  Patient tolerated procedure well.     Assessment:     Thickened endometrium Anxiety     Plan:     Biopsy pending Xanax 0.25mg  prn #30/0RF

## 2013-06-11 NOTE — Telephone Encounter (Signed)
Call to patient and advised that Dr Sabra Heck called in a different medication that may be more effective than what is available in office.  Patient states she did receive message from pharmacy and picked up this RX.  Advised she does still need to come sign consent before taking medication.  She is agreeable.  She questioned if still suppose to take Motrin and she is instructed to take this.  Routing to provider for final review. Patient agreeable to disposition. Will close encounter

## 2013-06-11 NOTE — Telephone Encounter (Signed)
Message left to return call to [place] at [phone] on cell and home.  Unsure if patient has message to come in to complete paperwork prior to medication.

## 2013-06-16 ENCOUNTER — Telehealth: Payer: Self-pay | Admitting: *Deleted

## 2013-06-16 NOTE — Telephone Encounter (Signed)
Patient notified of pathology report as directed by Dr Sabra Heck.  Consult appointment to discuss possible surgical options scheduled for Wed 06-23-13 at 115. Patient agreeable.  Encounter closed.

## 2013-06-16 NOTE — Telephone Encounter (Signed)
Message copied by Jaymes Graff on Wed Jun 16, 2013 11:22 AM ------      Message from: Megan Salon      Created: Tue Jun 15, 2013  9:50 PM       Please inform pt biopsy showed a polyp.  Should discuss removal as this was just a portion of the polyp.  I would recommend hysteroscopy with polyp resection.  May need consult.  Tissue is benign. ------

## 2013-06-22 ENCOUNTER — Encounter: Payer: Self-pay | Admitting: Obstetrics & Gynecology

## 2013-06-23 ENCOUNTER — Ambulatory Visit (INDEPENDENT_AMBULATORY_CARE_PROVIDER_SITE_OTHER): Payer: BC Managed Care – PPO | Admitting: Obstetrics & Gynecology

## 2013-06-23 ENCOUNTER — Encounter: Payer: Self-pay | Admitting: Obstetrics & Gynecology

## 2013-06-23 VITALS — BP 122/62 | HR 64 | Resp 16 | Ht 62.5 in | Wt 125.6 lb

## 2013-06-23 DIAGNOSIS — N840 Polyp of corpus uteri: Secondary | ICD-10-CM

## 2013-06-23 DIAGNOSIS — R9389 Abnormal findings on diagnostic imaging of other specified body structures: Secondary | ICD-10-CM

## 2013-06-23 MED ORDER — ESTRADIOL 0.1 MG/GM VA CREA: TOPICAL_CREAM | VAGINAL | Status: DC

## 2013-06-23 MED ORDER — HYDROCODONE-ACETAMINOPHEN 5-300 MG PO TABS: 1.0000 | ORAL_TABLET | Freq: Four times a day (QID) | ORAL | Status: DC | PRN

## 2013-06-23 NOTE — Progress Notes (Addendum)
73 y.o. G0P0 MarriedCaucasian female here for discussion of upcoming procedure.  Hysteroscopy/polyp resection  planned due to thickened endometrium on ultrasound and endometrial polyp tissue seen with endometrial biopsy.  Pre-op evaluation thus far has included ultrasound and biopsy.  Hysteroscopy video discussed.  Procedure discussed with patient.  Recovery and pain management discussed.  Risks discussed including but not limited to bleeding, rare risk of transfusion, infection, 1% risk of uterine perforation with risks of fluid deficit causing cardiac arrythmia, cerebral swelling and/or need to stop procedure early.  Fluid emboli and rare risk of death discussed.  DVT/PE, rare risk of risk of bowel/bladder/ureteral/vascular injury.  Patient aware if pathology abnormal she may need additional treatment.  All questions answered.    Patient would also like to discuss her vaginal dryness and painful intercourse.  Really would like to be more sexually active with her husband.  Several OTC products have been tried.  She has been unsuccessful with these.  Vaginal estrogen cream discussed.  Risks discussed including DVT/PE, stroke, MI, breast cancer.  These risks are less than when on HRT.  All questions discussed.     Ob Hx:   Patient's last menstrual period was 04/15/1990.          Sexually active: no Birth control: no method Last pap: 09/15/09 WNL Last MMG: 3/15 [facility] Tobacco: none  Past Surgical History  Procedure Laterality Date  . Removal of cyst from her left breast Left 8/05    benign - Dr. Margot Chimes  . US echocardiography  06/04/2004    EF 60-65%  . Cardiovascular stress test  10/19/1999    EF 97%, NO EVIDENCE OF ISCHEMIA  . Melanoma removed      from back    Past Medical History  Diagnosis Date  . MVP (mitral valve prolapse)   . Rheumatoid arthritis(714.0)   . Osteoporosis   . Fibromyalgia   . Chronic depression   . Melanoma     removed from back  . Rib fracture     Allergies:  Methotrexate derivatives; Plaquenil; Ridaura; and Sudafed  Current Outpatient Prescriptions  Medication Sig Dispense Refill  . alendronate (FOSAMAX) 70 MG tablet Take 70 mg by mouth once a week. Take with a full glass of water on an empty stomach.      . ALPRAZolam (XANAX) 0.25 MG tablet Take 1 tablet (0.25 mg total) by mouth 3 (three) times daily as needed for sleep.  30 tablet  0  . aspirin 81 MG tablet Take 81 mg by mouth every other day.       Marland Kitchen CALCIUM PO Take by mouth daily.      . cholecalciferol (VITAMIN D) 1000 UNITS tablet Take 1,000 Units by mouth daily.      Marland Kitchen escitalopram (LEXAPRO) 10 MG tablet Take 10 mg by mouth daily.      Marland Kitchen etanercept (ENBREL) 50 MG/ML injection Inject 50 mg into the skin once a week.      . Multiple Vitamin (MULTIVITAMIN) tablet Take 1 tablet by mouth daily.      . diazepam (VALIUM) 5 MG tablet Take 5 mg by mouth. Take one tablet by mouth once       No current facility-administered medications for this visit.    ROS: A comprehensive review of systems was negative.  Exam:    BP 122/62  Pulse 64  Resp 16  Ht 5' 2.5" (1.588 m)  Wt 125 lb 9.6 oz (56.972 kg)  BMI 22.59 kg/m2  LMP 04/15/1990  General appearance: alert and cooperative Head: Normocephalic, without obvious abnormality, atraumatic Neck: no adenopathy, supple, symmetrical, trachea midline and thyroid not enlarged, symmetric, no tenderness/mass/nodules Lungs: clear to auscultation bilaterally Heart: regular rate and rhythm, S1, S2 normal, no murmur, click, rub or gallop Extremities: extremities normal, atraumatic, no cyanosis or edema Skin: Skin color, texture, turgor normal. No rashes or lesions Lymph nodes: Cervical, supraclavicular, and axillary nodes normal. no inguinal nodes palpated Neurologic: Grossly normal   A: Endometrial polyp Thickened endometrium    P:  Hysteroscopy with polyp resection, D&C planned Rx for Motrin and Vicodin given. Medications/Vitamins reviewed.  Pt  knows needs to stop ASA and Vit D 1 week before procedure. Hysterectomy brochure given for pre and post op instructions.   ~25 minutes spent with patient >50% of time was in face to face discussion of above.

## 2013-06-23 NOTE — Progress Notes (Signed)
Hysteroscopy/Polyp Resection/D&C scheduled for 07-06-13 at 19 at [facility].  Pre-op instructions reviewed with patient and given written instruction sheet.

## 2013-06-28 ENCOUNTER — Telehealth: Payer: Self-pay | Admitting: Obstetrics & Gynecology

## 2013-06-28 NOTE — Telephone Encounter (Signed)
1. "Spotting brown blood" since 06/25/13 and has a surgery 06/29/13 with Dr. Sabra Heck. Patient wants to speak with nurse. 2. Pain RX for after surgery?  [facility].

## 2013-06-28 NOTE — Telephone Encounter (Signed)
That is ok.  She does have a polyp.  I will fix that next week when I remove the polyp.  This is ok as long as it is not heavy.  Encounter closed.

## 2013-06-28 NOTE — Telephone Encounter (Signed)
Voicemail confirmed mobile #. Left message for patient to call back. Advised that we need to discuss patient responsibility for upcoming surgery.

## 2013-06-28 NOTE — Telephone Encounter (Signed)
Spoke with patient. Advised patient that per plan benefits quote received, she will be responsible for $645.05 for surgeons fees associate with her upcoming surgery. Patient agreeable and will mail check tomorrow (03.17) for this amount.

## 2013-06-28 NOTE — Telephone Encounter (Signed)
Spoke with patient. She wanted Dr. Sabra Heck to be aware of intermittent spotting since 06/25/13. States it is "off and on." Patient not currently wearing a pad or tampon for bleeding. Intermittent cramping. No fevers. Advised patient to continue to monitor symptoms. Call back with any worsening bleeding. Advised would send a message to Dr. Sabra Heck to be aware. Patient  is scheduled for surgery for 3/24.   Karen Chafe, RN

## 2013-06-29 ENCOUNTER — Telehealth: Payer: Self-pay | Admitting: Obstetrics & Gynecology

## 2013-06-29 NOTE — Telephone Encounter (Signed)
Patient called back to confirm patient liability for surgeon fees. Advised $645.05. Also provided the office address for payment to be mailed.

## 2013-07-02 NOTE — Patient Instructions (Addendum)
   Your procedure is scheduled on:  Tuesday, Mar 24  Enter through the [employer] [facility] at:  [place] up the phone at the desk and dial [phone] and inform us of your arrival.  Please call this number if you have any problems the morning of surgery: [phone]  Remember: Do not eat or drink after midnight: Monday Take these medicines the morning of surgery with a SIP OF WATER:  NONE  Do not wear jewelry, make-up, or FINGER nail polish No metal in your hair or on your body. Do not wear lotions, powders, perfumes.  You may wear deodorant.  Do not bring valuables to the hospital. Contacts, dentures or bridgework may not be worn into surgery.  Patients discharged on the day of surgery will not be allowed to drive home.  [place] CELL [phone].

## 2013-07-05 ENCOUNTER — Encounter (HOSPITAL_COMMUNITY)
Admission: RE | Admit: 2013-07-05 | Discharge: 2013-07-05 | Disposition: A | Discharge status: Home / Self Care | Payer: BC Managed Care – PPO | Source: Ambulatory Visit | Attending: Obstetrics & Gynecology | Admitting: Obstetrics & Gynecology

## 2013-07-05 ENCOUNTER — Encounter (HOSPITAL_COMMUNITY): Payer: Self-pay

## 2013-07-05 HISTORY — DX: Cardiac murmur, unspecified: R01.1

## 2013-07-05 LAB — CBC
HCT: 41.6 % (ref 36.0–46.0)
Hemoglobin: 13.4 g/dL (ref 12.0–15.0)
MCH: 29.5 pg (ref 26.0–34.0)
MCHC: 32.2 g/dL (ref 30.0–36.0)
MCV: 91.4 fL (ref 78.0–100.0)
Platelets: 135 10*3/uL — ABNORMAL LOW (ref 150–400)
RBC: 4.55 MIL/uL (ref 3.87–5.11)
RDW: 13.6 % (ref 11.5–15.5)
WBC: 4.4 10*3/uL (ref 4.0–10.5)

## 2013-07-05 NOTE — Pre-Procedure Instructions (Signed)
Dr Royce Macadamia informed that patient's platelets were 135 at today's PAT appt.  No orders given.  [place] for surgery.

## 2013-07-06 ENCOUNTER — Ambulatory Visit (HOSPITAL_COMMUNITY)
Admission: RE | Admit: 2013-07-06 | Discharge: 2013-07-06 | Disposition: A | Discharge status: Home / Self Care | Payer: BC Managed Care – PPO | Source: Ambulatory Visit | Attending: Obstetrics & Gynecology | Admitting: Obstetrics & Gynecology

## 2013-07-06 ENCOUNTER — Encounter (HOSPITAL_COMMUNITY)
Admission: RE | Disposition: A | Discharge status: Home / Self Care | Payer: Self-pay | Source: Ambulatory Visit | Attending: Obstetrics & Gynecology

## 2013-07-06 ENCOUNTER — Encounter (HOSPITAL_COMMUNITY): Payer: Self-pay | Admitting: *Deleted

## 2013-07-06 ENCOUNTER — Ambulatory Visit (HOSPITAL_COMMUNITY): Payer: BC Managed Care – PPO | Admitting: Anesthesiology

## 2013-07-06 ENCOUNTER — Encounter (HOSPITAL_COMMUNITY): Payer: BC Managed Care – PPO | Admitting: Anesthesiology

## 2013-07-06 DIAGNOSIS — N859 Noninflammatory disorder of uterus, unspecified: Secondary | ICD-10-CM | POA: Insufficient documentation

## 2013-07-06 DIAGNOSIS — F3289 Other specified depressive episodes: Secondary | ICD-10-CM | POA: Insufficient documentation

## 2013-07-06 DIAGNOSIS — R9389 Abnormal findings on diagnostic imaging of other specified body structures: Secondary | ICD-10-CM

## 2013-07-06 DIAGNOSIS — D250 Submucous leiomyoma of uterus: Secondary | ICD-10-CM | POA: Insufficient documentation

## 2013-07-06 DIAGNOSIS — D259 Leiomyoma of uterus, unspecified: Secondary | ICD-10-CM

## 2013-07-06 DIAGNOSIS — N950 Postmenopausal bleeding: Secondary | ICD-10-CM | POA: Insufficient documentation

## 2013-07-06 DIAGNOSIS — N840 Polyp of corpus uteri: Secondary | ICD-10-CM | POA: Insufficient documentation

## 2013-07-06 DIAGNOSIS — I059 Rheumatic mitral valve disease, unspecified: Secondary | ICD-10-CM | POA: Insufficient documentation

## 2013-07-06 DIAGNOSIS — F329 Major depressive disorder, single episode, unspecified: Secondary | ICD-10-CM | POA: Insufficient documentation

## 2013-07-06 HISTORY — PX: DILATATION & CURRETTAGE/HYSTEROSCOPY WITH RESECTOCOPE: SHX5572

## 2013-07-06 SURGERY — DILATATION & CURETTAGE/HYSTEROSCOPY WITH RESECTOCOPE
Anesthesia: General | Site: Vagina

## 2013-07-06 MED ORDER — MIDAZOLAM HCL 2 MG/2ML IJ SOLN
INTRAMUSCULAR | Status: DC | PRN
  Administered 2013-07-06: 0.5 mg via INTRAVENOUS

## 2013-07-06 MED ORDER — CEFAZOLIN SODIUM-DEXTROSE 2-3 GM-% IV SOLR
INTRAVENOUS | Status: AC
  Filled 2013-07-06: qty 50

## 2013-07-06 MED ORDER — LIDOCAINE HCL (CARDIAC) 20 MG/ML IV SOLN
INTRAVENOUS | Status: AC
  Filled 2013-07-06: qty 5

## 2013-07-06 MED ORDER — ONDANSETRON HCL 4 MG/2ML IJ SOLN
INTRAMUSCULAR | Status: DC | PRN
  Administered 2013-07-06 (×2): 2 mg via INTRAVENOUS

## 2013-07-06 MED ORDER — FENTANYL CITRATE 0.05 MG/ML IJ SOLN
INTRAMUSCULAR | Status: DC | PRN
  Administered 2013-07-06 (×2): 25 ug via INTRAVENOUS

## 2013-07-06 MED ORDER — PHENYLEPHRINE HCL 10 MG/ML IJ SOLN
INTRAMUSCULAR | Status: DC | PRN
  Administered 2013-07-06: 80 ug via INTRAVENOUS
  Administered 2013-07-06 (×3): 40 ug via INTRAVENOUS

## 2013-07-06 MED ORDER — LIDOCAINE-EPINEPHRINE 1 %-1:100000 IJ SOLN
INTRAMUSCULAR | Status: AC
  Filled 2013-07-06: qty 1

## 2013-07-06 MED ORDER — GLYCINE 1.5 % IR SOLN
Status: DC | PRN
  Administered 2013-07-06: 500 mL

## 2013-07-06 MED ORDER — FENTANYL CITRATE 0.05 MG/ML IJ SOLN
INTRAMUSCULAR | Status: AC
  Filled 2013-07-06: qty 2

## 2013-07-06 MED ORDER — LACTATED RINGERS IV SOLN
INTRAVENOUS | Status: DC
  Administered 2013-07-06 (×2): via INTRAVENOUS

## 2013-07-06 MED ORDER — KETOROLAC TROMETHAMINE 30 MG/ML IJ SOLN
INTRAMUSCULAR | Status: AC
  Filled 2013-07-06: qty 1

## 2013-07-06 MED ORDER — MIDAZOLAM HCL 2 MG/2ML IJ SOLN
INTRAMUSCULAR | Status: AC
  Filled 2013-07-06: qty 2

## 2013-07-06 MED ORDER — FENTANYL CITRATE 0.05 MG/ML IJ SOLN
25.0000 ug | INTRAMUSCULAR | Status: DC | PRN
  Administered 2013-07-06: 50 ug via INTRAVENOUS

## 2013-07-06 MED ORDER — LIDOCAINE HCL (CARDIAC) 20 MG/ML IV SOLN
INTRAVENOUS | Status: DC | PRN
  Administered 2013-07-06: 80 mg via INTRAVENOUS

## 2013-07-06 MED ORDER — GLYCOPYRROLATE 0.2 MG/ML IJ SOLN
INTRAMUSCULAR | Status: DC | PRN
  Administered 2013-07-06: 0.1 mg via INTRAVENOUS
  Administered 2013-07-06: .1 mg via INTRAVENOUS

## 2013-07-06 MED ORDER — ONDANSETRON HCL 4 MG/2ML IJ SOLN
INTRAMUSCULAR | Status: AC
  Filled 2013-07-06: qty 2

## 2013-07-06 MED ORDER — DEXAMETHASONE SODIUM PHOSPHATE 10 MG/ML IJ SOLN
INTRAMUSCULAR | Status: AC
  Filled 2013-07-06: qty 1

## 2013-07-06 MED ORDER — KETOROLAC TROMETHAMINE 30 MG/ML IJ SOLN: 15.0000 mg | Freq: Once | INTRAMUSCULAR | Status: DC | PRN

## 2013-07-06 MED ORDER — GLYCOPYRROLATE 0.2 MG/ML IJ SOLN
INTRAMUSCULAR | Status: AC
  Filled 2013-07-06: qty 1

## 2013-07-06 MED ORDER — CEFAZOLIN SODIUM-DEXTROSE 2-3 GM-% IV SOLR
2.0000 g | INTRAVENOUS | Status: AC
  Administered 2013-07-06: 2 g via INTRAVENOUS

## 2013-07-06 MED ORDER — MEPERIDINE HCL 25 MG/ML IJ SOLN: 6.2500 mg | INTRAMUSCULAR | Status: DC | PRN

## 2013-07-06 MED ORDER — ONDANSETRON HCL 4 MG/2ML IJ SOLN: 4.0000 mg | Freq: Once | INTRAMUSCULAR | Status: DC | PRN

## 2013-07-06 MED ORDER — PROPOFOL 10 MG/ML IV BOLUS
INTRAVENOUS | Status: DC | PRN
  Administered 2013-07-06: 130 mg via INTRAVENOUS

## 2013-07-06 MED ORDER — LIDOCAINE-EPINEPHRINE 1 %-1:100000 IJ SOLN
INTRAMUSCULAR | Status: DC | PRN
  Administered 2013-07-06: 8 mL

## 2013-07-06 SURGICAL SUPPLY — 22 items
CANISTER SUCT 3000ML (MISCELLANEOUS) ×2 IMPLANT
CATH ROBINSON RED A/P 16FR (CATHETERS) ×2 IMPLANT
CLOTH BEACON ORANGE TIMEOUT ST (SAFETY) ×2 IMPLANT
CONTAINER PREFILL 10% NBF 60ML (FORM) ×4 IMPLANT
DILATOR CANAL MILEX (MISCELLANEOUS) ×2 IMPLANT
DRAPE HYSTEROSCOPY (DRAPE) ×2 IMPLANT
DRSG TELFA 3X8 NADH (GAUZE/BANDAGES/DRESSINGS) ×2 IMPLANT
ELECT REM PT RETURN 9FT ADLT (ELECTROSURGICAL)
ELECTRODE REM PT RTRN 9FT ADLT (ELECTROSURGICAL) IMPLANT
GLOVE BIOGEL PI IND STRL 7.0 (GLOVE) ×1 IMPLANT
GLOVE BIOGEL PI INDICATOR 7.0 (GLOVE) ×1
GLOVE ECLIPSE 6.5 STRL STRAW (GLOVE) ×4 IMPLANT
GOWN STRL REUS W/TWL LRG LVL3 (GOWN DISPOSABLE) ×4 IMPLANT
LOOP ANGLED CUTTING 22FR (CUTTING LOOP) IMPLANT
NEEDLE SPNL 22GX3.5 QUINCKE BK (NEEDLE) ×2 IMPLANT
PACK VAGINAL MINOR WOMEN LF (CUSTOM PROCEDURE TRAY) ×2 IMPLANT
PAD OB MATERNITY 4.3X12.25 (PERSONAL CARE ITEMS) ×2 IMPLANT
SET TUBING HYSTEROSCOPY 2 NDL (TUBING) ×2 IMPLANT
SYR CONTROL 10ML LL (SYRINGE) ×2 IMPLANT
TOWEL OR 17X24 6PK STRL BLUE (TOWEL DISPOSABLE) ×4 IMPLANT
TUBE HYSTEROSCOPY W Y-CONNECT (TUBING) ×2 IMPLANT
WATER STERILE IRR 1000ML POUR (IV SOLUTION) ×2 IMPLANT

## 2013-07-06 NOTE — Transfer of Care (Signed)
Immediate Anesthesia Transfer of Care Note  Patient: Elizabeth Estrada  Procedure(s) Performed: Procedure(s): DILATATION & CURETTAGE/HYSTEROSCOPY WITH RESECTOCOPE with removal of endometrial lesion (N/A)  Patient Location: PACU  Anesthesia Type:General  Level of Consciousness: awake, alert  and oriented  Airway & Oxygen Therapy: Patient Spontanous Breathing and Patient connected to nasal cannula oxygen  Post-op Assessment: Report given to PACU RN and Post -op Vital signs reviewed and stable  Post vital signs: Reviewed and stable  Complications: No apparent anesthesia complications

## 2013-07-06 NOTE — Op Note (Signed)
07/06/2013  12:26 PM  PATIENT:  Elizabeth Estrada  73 y.o. female  PRE-OPERATIVE DIAGNOSIS:  PMP bleeding, endometrial polyp  POST-OPERATIVE DIAGNOSIS:  Possible submucosal fibroids versus endometrial polyp  PROCEDURE:  Procedure(s): DILATATION & CURETTAGE/HYSTEROSCOPY WITH RESECTOCOPE with removal of endometrial lesion  SURGEON:  MILLER, MARY SUZANNE  ASSISTANTS: OR staff   ANESTHESIA:   general  ESTIMATED BLOOD LOSS: 20cc  BLOOD ADMINISTERED:none   FLUIDS: 1000ccLR  UOP:  30cc drained with I&O cath at beginning of procedure  SPECIMEN:  Endometrial polyp vs fibroid and endometrial curettings  DISPOSITION OF SPECIMEN:  PATHOLOGY  FINDINGS: bumpy appearing lesion on posterior aspect of endometrial cavity.  Looks more like a fibroid or small fibroids than a polyp.  DESCRIPTION OF OPERATION: Patient was taken to the operating room.  She is placed in the supine position. SCDs were on her lower extremities and functioning properly. General anesthesia with an LMA was administered without difficulty. Dr. Jillyn Hidden oversaw case.  Legs were then placed in the [place] in the low lithotomy position. The legs were lifted to the high lithotomy position and the Betadine prep was used on the inner thighs perineum and vagina x3. Patient was draped in a normal standard fashion. An in and out catheterization with a red rubber Foley catheter was performed. Approximately 30 cc of clear urine was noted. A bivalve speculum was placed the vagina. The anterior lip of the cervix was grasped with single-tooth tenaculum.  A paracervical block of 1% lidocaine mixed one-to-one with epinephrine (1:100,000 units).  10 cc was used total. The cervix is dilated up to #21 [facility] dilators. The endometrial cavity sounded to 6.5 cm.   A 2.9 millimeter diagnostic hysteroscope was obtained. 1.5% glycine was used as a hysteroscopic fluid. The hysteroscope was advanced through the endocervical canal into the  endometrial cavity. The tubal ostia were noted bilaterally. There was what appeared to be several very small fibroids on the posterior aspect of the endometrial cavity vs a lumpy appearing fibroid.  It was also sessile in appearance.  Using a polyp forceps, this lesion was grasped several times and removed in pieces.  Ultimately, the entire lesion was removed.  Several re-visualizations of the endometrial cavity were necessary to ensure that the lesion was completely removed.  The hysteroscope was removed.  At the end of the procedure, a #1 toothed curette was used to curette the entire endometrial cavity until a rough gritty texture was noted in all quadrants.     The fluid deficit was 25 cc. The tenaculum was removed from the anterior lip of the cervix. The speculum was removed from the vagina. The prep was cleansed of the patient's skin. The legs are positioned back in the supine position. Sponge, lap, needle, initially counts were correct x2. Patient was taken to recovery in stable condition.  COUNTS:  YES  PLAN OF CARE: Transfer to PACU

## 2013-07-06 NOTE — H&P (Signed)
Elizabeth Estrada is an 73 y.o. female G0P0 MWF here for hysteroscopy, polyp resection, D&C due to PMP bleeding and endometrial polyp seen on ultrasound.  Endometrial biopsy was negative for abnormal cells.  Risks and benefits have been discussed with patient.  She is here with her sister and is ready to proceed.  Pertinent Gynecological History: Menses: post-menopausal Bleeding: dysfunctional uterine bleeding Contraception: post menopausal status DES exposure: denies Blood transfusions: none Sexually transmitted diseases: no past history Previous GYN Procedures: none  Last mammogram: normal Date: 3/15 Last pap: normal Date: 2/10 OB History: G0, P0   Menstrual History: Patient's last menstrual period was 04/15/1990.    Past Medical History  Diagnosis Date  . MVP (mitral valve prolapse)   . Osteoporosis   . Chronic depression   . Melanoma     removed from back  . Rib fracture   . Heart murmur     NO PROBLEMS AS ADULT  . Anxiety   . Rheumatoid arthritis(714.0)     HANDS. KNEES    Past Surgical History  Procedure Laterality Date  . Removal of cyst from her left breast Left 8/05    benign - Dr. Margot Chimes  . US echocardiography  06/04/2004    EF 60-65%  . Cardiovascular stress test  10/19/1999, 2013    EF 97%, NO EVIDENCE OF ISCHEMIA, 2013 NORMAL  . Melanoma removed      from back  . Breast surgery      LEFT BREAST BX- BENIGN  . Colonoscopy    . Eye surgery      BILATERAL CATARACT    Family History  Problem Relation Age of Onset  . Coronary artery disease Mother   . Pancreatic cancer Father   . Heart disease Father     bypass  . Hypertension Father     Social History:  reports that she has never smoked. She has never used smokeless tobacco. She reports that she does not drink alcohol or use illicit drugs.  Allergies:  Allergies  Allergen Reactions  . Methotrexate Derivatives Itching and Swelling  . Plaquenil [Hydroxychloroquine Sulfate]   . Ridaura [Auranofin]    . Sudafed [Pseudoephedrine]     nervous    Prescriptions prior to admission  Medication Sig Dispense Refill  . ALPRAZolam (XANAX) 0.25 MG tablet Take 1 tablet (0.25 mg total) by mouth 3 (three) times daily as needed for sleep.  30 tablet  0  . aspirin 81 MG tablet Take 81 mg by mouth every other day.       Marland Kitchen CALCIUM PO Take by mouth daily.      . cholecalciferol (VITAMIN D) 1000 UNITS tablet Take 1,000 Units by mouth daily.      . diazepam (VALIUM) 5 MG tablet Take 5 mg by mouth. Take one tablet by mouth once      . escitalopram (LEXAPRO) 10 MG tablet Take 10 mg by mouth daily.      Marland Kitchen etanercept (ENBREL) 50 MG/ML injection Inject 50 mg into the skin once a week.      . Multiple Vitamin (MULTIVITAMIN) tablet Take 1 tablet by mouth daily.      Marland Kitchen estradiol (ESTRACE) 0.1 MG/GM vaginal cream 1/2 gram vaginally twice weekly  42.5 g  1  . Hydrocodone-Acetaminophen (VICODIN) 5-300 MG TABS Take 1 tablet by mouth every 6 (six) hours as needed.  15 each  0    Review of Systems  All other systems reviewed and are negative.    Blood  pressure 138/54, pulse 68, temperature 98.1 F (36.7 C), temperature source Oral, resp. rate 18, last menstrual period 04/15/1990, SpO2 100.00%. Physical Exam  Vitals reviewed. Constitutional: She is oriented to person, place, and time. She appears well-developed and well-nourished.  Cardiovascular: Normal rate and regular rhythm.   Respiratory: Effort normal and breath sounds normal.  Neurological: She is alert and oriented to person, place, and time.  Skin: Skin is warm and dry.  Psychiatric: She has a normal mood and affect.    No results found for this or any previous visit (from the past 24 hour(s)).  No results found.  Assessment/Plan: 73 yo G0P0 MWF here for hysteroscopy, polyp resection, D&C due to PMP bleeding and endometrial polyp noted on ultrasound.  All questions answered.  Pt here and ready to proceed.  Hale Bogus SUZANNE 07/06/2013, 11:13  AM

## 2013-07-06 NOTE — Discharge Instructions (Signed)
Post-surgical Instructions, Outpatient Surgery  You may expect to feel dizzy, weak, and drowsy for as long as 24 hours after receiving the medicine that made you sleep (anesthetic). For the first 24 hours after your surgery:    Do not drive a car, ride a bicycle, participate in physical activities, or take public transportation until you are done taking narcotic pain medicines or as directed by Dr. Sabra Heck.   Do not drink alcohol or take tranquilizers.   Do not take medicine that has not been prescribed by your physicians.   Do not sign important papers or make important decisions while on narcotic pain medicines.   Have a responsible person with you.   PAIN MANAGEMENT  Motrin 800mg .  (This is the same as 4-200mg  over the counter tablets of Motrin or ibuprofen.)  You may take this every eight hours or as needed for cramping.    Vicodin 5/300mg .  For more severe pain, take one or two tablets every four to six hours as needed for pain control.  (Remember that narcotic pain medications increase your risk of constipation.  If this becomes a problem, you may take an over the counter stool softener like Colace 100mg  up to four times a day.)  DO'S AND DON'T'S  Do not take a tub bath for one week.  You may shower on the first day after your surgery  Do not do any heavy lifting for one to two weeks.  This increases the chance of bleeding.  Do move around as you feel able.  Stairs are fine.  You may begin to exercise again as you feel able.  Do not lift any weights for two weeks.  Do not put anything in the vagina for two weeks--no tampons, intercourse, or douching.    REGULAR MEDIATIONS/VITAMINS:  You may restart all of your regular medications as prescribed.  You may restart all of your vitamins as you normally take them.    PLEASE CALL OR SEEK MEDICAL CARE IF:  You have persistent nausea and vomiting.   You have trouble eating or drinking.   You have an oral temperature above 100.5.    You have constipation that is not helped by adjusting diet or increasing fluid intake. Pain medicines are a common cause of constipation.   You have heavy vaginal bleeding  You have redness or drainage from your incision(s) or there is increasing pain or tenderness near or in the surgical site.

## 2013-07-06 NOTE — Anesthesia Preprocedure Evaluation (Signed)
Anesthesia Evaluation  Patient identified by MRN, date of birth, ID band Patient awake    Reviewed: Allergy & Precautions, H&P , NPO status , Patient's Chart, lab work & pertinent test results  Airway Mallampati: II TM Distance: >3 FB Neck ROM: full    Dental  (+) Teeth Intact, Poor Dentition   Pulmonary neg pulmonary ROS,    Pulmonary exam normal       Cardiovascular negative cardio ROS      Neuro/Psych negative neurological ROS     GI/Hepatic negative GI ROS, Neg liver ROS,   Endo/Other  negative endocrine ROS  Renal/GU negative Renal ROS     Musculoskeletal   Abdominal Normal abdominal exam  (+)   Peds  Hematology negative hematology ROS (+)   Anesthesia Other Findings   Reproductive/Obstetrics negative OB ROS                           Anesthesia Physical Anesthesia Plan  ASA: II  Anesthesia Plan: General   Post-op Pain Management:    Induction: Intravenous  Airway Management Planned: LMA  Additional Equipment:   Intra-op Plan:   Post-operative Plan:   Informed Consent: I have reviewed the patients History and Physical, chart, labs and discussed the procedure including the risks, benefits and alternatives for the proposed anesthesia with the patient or authorized representative who has indicated his/her understanding and acceptance.     Plan Discussed with: CRNA and Surgeon  Anesthesia Plan Comments:         Anesthesia Quick Evaluation

## 2013-07-06 NOTE — Anesthesia Postprocedure Evaluation (Signed)
Anesthesia Post Note  Patient: Elizabeth Estrada  Procedure(s) Performed: Procedure(s) (LRB): DILATATION & CURETTAGE/HYSTEROSCOPY WITH RESECTOCOPE with removal of endometrial lesion (N/A)  Anesthesia type: General  Patient location: PACU  Post pain: Pain level controlled  Post assessment: Post-op Vital signs reviewed  Last Vitals:  Filed Vitals:   07/06/13 1315  BP: 124/52  Pulse: 74  Temp:   Resp: 18    Post vital signs: Reviewed  Level of consciousness: sedated  Complications: No apparent anesthesia complications

## 2013-07-07 ENCOUNTER — Encounter (HOSPITAL_COMMUNITY): Payer: Self-pay | Admitting: Obstetrics & Gynecology

## 2013-07-16 ENCOUNTER — Inpatient Hospital Stay (HOSPITAL_COMMUNITY): Payer: BC Managed Care – PPO

## 2013-07-16 ENCOUNTER — Inpatient Hospital Stay (HOSPITAL_COMMUNITY)
Admission: AD | Admit: 2013-07-16 | Discharge: 2013-07-16 | Disposition: A | Discharge status: Home / Self Care | Payer: BC Managed Care – PPO | Source: Ambulatory Visit | Attending: Obstetrics & Gynecology | Admitting: Obstetrics & Gynecology

## 2013-07-16 ENCOUNTER — Encounter (HOSPITAL_COMMUNITY): Payer: Self-pay | Admitting: *Deleted

## 2013-07-16 ENCOUNTER — Telehealth: Payer: Self-pay | Admitting: Obstetrics & Gynecology

## 2013-07-16 DIAGNOSIS — M810 Age-related osteoporosis without current pathological fracture: Secondary | ICD-10-CM | POA: Insufficient documentation

## 2013-07-16 DIAGNOSIS — F3289 Other specified depressive episodes: Secondary | ICD-10-CM | POA: Insufficient documentation

## 2013-07-16 DIAGNOSIS — M069 Rheumatoid arthritis, unspecified: Secondary | ICD-10-CM | POA: Insufficient documentation

## 2013-07-16 DIAGNOSIS — I059 Rheumatic mitral valve disease, unspecified: Secondary | ICD-10-CM | POA: Insufficient documentation

## 2013-07-16 DIAGNOSIS — F411 Generalized anxiety disorder: Secondary | ICD-10-CM | POA: Insufficient documentation

## 2013-07-16 DIAGNOSIS — R1032 Left lower quadrant pain: Secondary | ICD-10-CM | POA: Insufficient documentation

## 2013-07-16 DIAGNOSIS — F329 Major depressive disorder, single episode, unspecified: Secondary | ICD-10-CM | POA: Insufficient documentation

## 2013-07-16 DIAGNOSIS — R109 Unspecified abdominal pain: Secondary | ICD-10-CM

## 2013-07-16 LAB — CBC WITH DIFFERENTIAL/PLATELET
Basophils Absolute: 0 10*3/uL (ref 0.0–0.1)
Basophils Relative: 0 % (ref 0–1)
Eosinophils Absolute: 0 10*3/uL (ref 0.0–0.7)
Eosinophils Relative: 0 % (ref 0–5)
HCT: 41.5 % (ref 36.0–46.0)
Hemoglobin: 13.5 g/dL (ref 12.0–15.0)
Lymphocytes Relative: 10 % — ABNORMAL LOW (ref 12–46)
Lymphs Abs: 0.8 10*3/uL (ref 0.7–4.0)
MCH: 29.4 pg (ref 26.0–34.0)
MCHC: 32.5 g/dL (ref 30.0–36.0)
MCV: 90.4 fL (ref 78.0–100.0)
Monocytes Absolute: 0.4 10*3/uL (ref 0.1–1.0)
Monocytes Relative: 4 % (ref 3–12)
Neutro Abs: 7 10*3/uL (ref 1.7–7.7)
Neutrophils Relative %: 85 % — ABNORMAL HIGH (ref 43–77)
Platelets: 131 10*3/uL — ABNORMAL LOW (ref 150–400)
RBC: 4.59 MIL/uL (ref 3.87–5.11)
RDW: 13.5 % (ref 11.5–15.5)
WBC: 8.2 10*3/uL (ref 4.0–10.5)

## 2013-07-16 LAB — URINALYSIS, ROUTINE W REFLEX MICROSCOPIC
Bilirubin Urine: NEGATIVE
Glucose, UA: NEGATIVE mg/dL
Ketones, ur: NEGATIVE mg/dL
Leukocytes, UA: NEGATIVE
Nitrite: NEGATIVE
Protein, ur: NEGATIVE mg/dL
Specific Gravity, Urine: >1.03 — ABNORMAL HIGH (ref 1.005–1.030)
Urobilinogen, UA: 0.2 mg/dL (ref 0.0–1.0)
pH: 5 (ref 5.0–8.0)

## 2013-07-16 LAB — COMPREHENSIVE METABOLIC PANEL
ALT: 11 U/L (ref 0–35)
AST: 20 U/L (ref 0–37)
Albumin: 3.7 g/dL (ref 3.5–5.2)
Alkaline Phosphatase: 63 U/L (ref 39–117)
BUN: 23 mg/dL (ref 6–23)
CO2: 26 mEq/L (ref 19–32)
Calcium: 9.1 mg/dL (ref 8.4–10.5)
Chloride: 103 mEq/L (ref 96–112)
Creatinine, Ser: 0.76 mg/dL (ref 0.50–1.10)
GFR calc Af Amer: >90 mL/min (ref >90)
GFR calc non Af Amer: 82 mL/min — ABNORMAL LOW (ref >90)
Glucose, Bld: 99 mg/dL (ref 70–99)
Potassium: 4.6 mEq/L (ref 3.7–5.3)
Sodium: 140 mEq/L (ref 137–147)
Total Bilirubin: 0.8 mg/dL (ref 0.3–1.2)
Total Protein: 7 g/dL (ref 6.0–8.3)

## 2013-07-16 LAB — URINE MICROSCOPIC-ADD ON

## 2013-07-16 MED ORDER — ONDANSETRON HCL 4 MG PO TABS: 4.0000 mg | ORAL_TABLET | Freq: Three times a day (TID) | ORAL | Status: DC | PRN

## 2013-07-16 MED ORDER — HYDROMORPHONE HCL 2 MG PO TABS: 1.0000 mg | ORAL_TABLET | Freq: Four times a day (QID) | ORAL | Status: DC | PRN

## 2013-07-16 MED ORDER — ONDANSETRON HCL 4 MG/2ML IJ SOLN
4.0000 mg | Freq: Four times a day (QID) | INTRAMUSCULAR | Status: DC
  Administered 2013-07-16: 4 mg via INTRAVENOUS
  Filled 2013-07-16: qty 2

## 2013-07-16 MED ORDER — TAMSULOSIN HCL 0.4 MG PO CAPS: 0.4000 mg | ORAL_CAPSULE | Freq: Every day | ORAL | Status: DC

## 2013-07-16 MED ORDER — HYDROMORPHONE HCL PF 1 MG/ML IJ SOLN
0.5000 mg | INTRAMUSCULAR | Status: DC | PRN
  Administered 2013-07-16: 0.5 mg via INTRAVENOUS
  Filled 2013-07-16: qty 1

## 2013-07-16 MED ORDER — SODIUM CHLORIDE 0.9 % IV SOLN
INTRAVENOUS | Status: DC
  Administered 2013-07-16: 13:00:00 via INTRAVENOUS

## 2013-07-16 NOTE — Progress Notes (Signed)
Patient ID: Elizabeth Estrada, female   DOB: [birth year], 73 y.o.   MRN: [id number]     CT with nephrolithiasis. Spoke with on call urologist--Dr. Louis Meckel.  He feels she can go home with close follow up.  Pt will be contacted early next week.  Pt will be sent home with ibuprofen, flomax, and dilaudid.  Has my deeper number to call with any new issues.  Pt needs to return to [facility] ER with pain not controlled by pain medication and/or fever.

## 2013-07-16 NOTE — MAU Note (Signed)
Patient states she had a D & C on 3-24. States she has had some vaginal discomfort and pressure for a few days but woke up this am with abdominal pain on the lower left side radiating to the back. Pain is getting worse. Denies bleeding or vaginal discharge.

## 2013-07-16 NOTE — Telephone Encounter (Signed)
Pt is 9 days post op from hysteroscopy, polyp resection.  She calls stating she is having excrutiating pain "down there".  She is upset and tearful and cannot give any better description.  When asked more specific questions, she says she just doesn't know but this has to be related to her prior surgery.  Advised pt to go to MAU for additional evaluation as office is closed for holiday weekend.  Pt voices understanding.

## 2013-07-16 NOTE — Discharge Instructions (Signed)
Kidney Stones Kidney stones (urolithiasis) are deposits that form inside your kidneys. The intense pain is caused by the stone moving through the urinary tract. When the stone moves, the ureter goes into spasm around the stone. The stone is usually passed in the urine.  CAUSES   A disorder that makes certain neck glands produce too much parathyroid hormone (primary hyperparathyroidism).  A buildup of uric acid crystals, similar to gout in your joints.  Narrowing (stricture) of the ureter.  A kidney obstruction present at birth (congenital obstruction).  Previous surgery on the kidney or ureters.  Numerous kidney infections. SYMPTOMS   Feeling sick to your stomach (nauseous).  Throwing up (vomiting).  Blood in the urine (hematuria).  Pain that usually spreads (radiates) to the groin.  Frequency or urgency of urination. DIAGNOSIS   Taking a history and physical exam.  Blood or urine tests.  CT scan.  Occasionally, an examination of the inside of the urinary bladder (cystoscopy) is performed. TREATMENT   Observation.  Increasing your fluid intake.  Extracorporeal shock wave lithotripsy This is a noninvasive procedure that uses shock waves to break up kidney stones.  Surgery may be needed if you have severe pain or persistent obstruction. There are various surgical procedures. Most of the procedures are performed with the use of small instruments. Only small incisions are needed to accommodate these instruments, so recovery time is minimized. The size, location, and chemical composition are all important variables that will determine the proper choice of action for you. Talk to your health care provider to better understand your situation so that you will minimize the risk of injury to yourself and your kidney.  HOME CARE INSTRUCTIONS   Drink enough water and fluids to keep your urine clear or pale yellow. This will help you to pass the stone or stone fragments.  Strain  all urine through the provided strainer. Keep all particulate matter and stones for your health care provider to see. The stone causing the pain may be as small as a grain of salt. It is very important to use the strainer each and every time you pass your urine. The collection of your stone will allow your health care provider to analyze it and verify that a stone has actually passed. The stone analysis will often identify what you can do to reduce the incidence of recurrences.  Only take over-the-counter or prescription medicines for pain, discomfort, or fever as directed by your health care provider.  Make a follow-up appointment with your health care provider as directed.  Get follow-up X-rays if required. The absence of pain does not always mean that the stone has passed. It may have only stopped moving. If the urine remains completely obstructed, it can cause loss of kidney function or even complete destruction of the kidney. It is your responsibility to make sure X-rays and follow-ups are completed. Ultrasounds of the kidney can show blockages and the status of the kidney. Ultrasounds are not associated with any radiation and can be performed easily in a matter of minutes. SEEK MEDICAL CARE IF:  You experience pain that is progressive and unresponsive to any pain medicine you have been prescribed. SEEK IMMEDIATE MEDICAL CARE IF:   Pain cannot be controlled with the prescribed medicine.  You have a fever or shaking chills.  The severity or intensity of pain increases over 18 hours and is not relieved by pain medicine.  You develop a new onset of abdominal pain.  You feel faint or pass  out.  You are unable to urinate. MAKE SURE YOU:   Understand these instructions.  Will watch your condition.  Will get help right away if you are not doing well or get worse. Document Released: 04/01/2005 Document Revised: 12/02/2012 Document Reviewed: 09/02/2012 [facility] Patient Information 2014  [place].    SPECIFIC INSTRUCTIONS: Start Flomax (or the generic) 0.4mg  daily.  Prescription was sent to your pharmacy Ibuprofen 400mg  (over the counter is fine) every 6 hours.  Take this around the clock. Dialudid 2mg  tablets.  You will be given this prescription.  Take it every 4-6 hours as needed.  You can cut them in 1/2. If you have nausea, there is a prescription for Zofran at your pharmacy.  You can take this every six hours as needed.  IF YOU HAVE A TEMPERATURE > 100.5 OR WORSENING PAIN OR YOU ARE THROWING UP AND CAN'T KEEP DOWN YOUR MEDICATIONS, PLEASE GO TO [facility].  Dr. Ammie Ferrier pager number for any questions:  [phone]

## 2013-07-16 NOTE — MAU Provider Note (Signed)
73 y.o. G0P0 MarriedCaucasianF here with new onset of flank and LLQ pain that started this morning.  Pt having waves of nausea and dry heaves but hasn't had emesis yet.  Pt called through paging service as she is 10 days post procedure from hysteroscopy and polyp resection.  Had spotting for about 6-7 days but none for about three days.  No vaginal discharge.  No dysuria.  No hematuria.  No fevers.  Feels really uncomfortable.  Hasn't taken anything for pain or nausea--just came straight to MAU after talking with me.    Pt does have a hx of stones with last one in 2011.  At that time, she was thought to have appendicitis.  Pt hasn't really thought about whether this is a stone.  She just assumed since she had surgery 10 days ago, it was related to that procedure.    reports that she has never smoked. She has never used smokeless tobacco. She reports that she does not drink alcohol or use illicit drugs.  Past Medical History  Diagnosis Date  . MVP (mitral valve prolapse)   . Osteoporosis   . Chronic depression   . Melanoma     removed from back  . Rib fracture   . Heart murmur     NO PROBLEMS AS ADULT  . Anxiety   . Rheumatoid arthritis(714.0)     HANDS. KNEES    Past Surgical History  Procedure Laterality Date  . Removal of cyst from her left breast Left 8/05    benign - Dr. Margot Chimes  . US echocardiography  06/04/2004    EF 60-65%  . Cardiovascular stress test  10/19/1999, 2013    EF 97%, NO EVIDENCE OF ISCHEMIA, 2013 NORMAL  . Melanoma removed      from back  . Breast surgery      LEFT BREAST BX- BENIGN  . Colonoscopy    . Eye surgery      BILATERAL CATARACT  . Dilatation & currettage/hysteroscopy with resectocope N/A 07/06/2013    Procedure: DILATATION & CURETTAGE/HYSTEROSCOPY WITH RESECTOCOPE with removal of endometrial lesion;  Surgeon: [name], MD;  Location: [facility];  Service: Gynecology;  Laterality: N/A;    Current Facility-Administered Medications  Medication  Dose Route Frequency Provider Last Rate Last Dose  . 0.9 %  sodium chloride infusion   Intravenous Continuous [name], MD      . HYDROmorphone (DILAUDID) injection 0.5 mg  0.5 mg Intravenous Q2H PRN [name], MD      . ondansetron [facility]) injection 4 mg  4 mg Intravenous 4 times per day [name], MD        Family History  Problem Relation Age of Onset  . Coronary artery disease Mother   . Pancreatic cancer Father   . Heart disease Father     bypass  . Hypertension Father     ROS:  Pertinent items are noted in HPI.  Otherwise, a comprehensive ROS was negative.  Exam:   BP 167/81  Pulse 75  Temp(Src) 98.1 F (36.7 C) (Oral)  Resp 20  SpO2 100%  LMP 04/15/1990   Weight change:   Ht Readings from Last 3 Encounters:  07/05/13 5' 2.5" (1.588 m)  06/23/13 5' 2.5" (1.588 m)  06/11/13 5' 2.5" (1.588 m)    General appearance: alert, cooperative and appears stated age Head: Normocephalic, without obvious abnormality, atraumatic Lungs: clear to auscultation bilaterally Heart: regular rate and rhythm Abdomen: soft, mild tenderness  to palpation in suprapubic region and LLQ, normal bowel sounds; no masses,  no organomegaly Extremities: extremities normal, atraumatic, no cyanosis or edema Skin: Skin color, texture, turgor normal. No rashes or lesions Lymph nodes: Cervical, supraclavicular, and axillary nodes normal. No abnormal inguinal nodes palpated Neurologic: Grossly normal  Pelvic: External genitalia:  no lesions              Urethra:  normal appearing urethra with no masses, tenderness or lesions              Bartholins and Skenes: normal                 Vagina: Atrophic, no bleeding, no discharge              Cervix: no lesions              Pap taken: no Bimanual Exam:  Uterus:  normal size, contour, position, consistency, mobility, non-tender, NO CMT              Adnexa: normal adnexa and no mass, fullness, tenderness               Anus:   normal sphincter tone, no lesions  A:  Suprapubic, LLQ, and left flank pain S/p hysteroscopic polyp resection with D&C, no current evidence of post-op infection MVW Osteoporosis H/O depression Anxiety  P:   CT abd/pelvic w/o constrast (spoke with radiology) CBC, UA, CMP Fluid bolus with 500cc LR, then 125cc/hr Dialudid 0.5mg  q2 Zofran for nausea

## 2013-07-21 ENCOUNTER — Telehealth: Payer: Self-pay | Admitting: Obstetrics & Gynecology

## 2013-07-21 NOTE — Telephone Encounter (Signed)
Patient has an appointment 07/23/13 for a post op check. Patient is asking if she needs to keep this appointment.

## 2013-07-21 NOTE — Telephone Encounter (Signed)
Spoke with patient. Patient states that she was seen last Friday 4/3 by Dr.Miller because she was having some pain and wanted to make sure it was not related to previous surgery. Patient states has three kidney stones that were causing her the pain and no complications with surgery. Still having some pressure but has not taken any pain medication since Saturday. Pain level is 1-2/10. Followed up with Urologist on Monday and is scheduled to go back in two weeks to get an [place]. Patient wondering if she still needs to come in for 4/10 post op follow up appointment since she saw Dr.Miller last week and was examined for post op complications. Advised would check with provider and call patient back.

## 2013-07-21 NOTE — Telephone Encounter (Signed)
Spoke with patient. Message from [place] given. Appointment for 4/10 cancelled. AEX scheduled for 03/17/14.

## 2013-07-21 NOTE — Telephone Encounter (Signed)
Left message to call Kaitlyn at [phone]. 

## 2013-07-21 NOTE — Telephone Encounter (Signed)
OK to cancel appt.  Did pelvic exam in MAU and she wasn't having any gyn issues.  Thanks.  Encounter closed.  She needs f/u when AEX is due.

## 2013-07-23 ENCOUNTER — Ambulatory Visit: Payer: BC Managed Care – PPO | Admitting: Obstetrics & Gynecology

## 2013-08-02 ENCOUNTER — Telehealth: Payer: Self-pay | Admitting: Obstetrics & Gynecology

## 2013-08-02 NOTE — Telephone Encounter (Signed)
Fine to keep appt tomorrow.  Thank you.  Encounter closed.

## 2013-08-02 NOTE — Telephone Encounter (Signed)
Spoke with patient. Patient states that she began having some brown spotting on Friday. Patient states she is still having spotting that she is wearing a panti liner for which she is changing once a day. Patient states that she was having some spotting following surgery but it stopped and has started again. Patient states has follow up appointment with urology scheduled for this Thursday related to kidney stones. Patient states she is having some back pain that she rates at a 3/10. Denies taking any pain medication at this time. Scheduled appointment with [facility] for tomorrow 4/21 at 3:15. Patient agreeable to date and time. Advised patient if spotting increases or any new symptoms arise to call our office for earlier appointment or to seek urgent care. Patient agreeable.   Dr.Miller, okay to keep appointment for 4/21 at 3:15 or would you like to see patient sooner?

## 2013-08-02 NOTE — Telephone Encounter (Signed)
Patient said she is having some brown spotting wants to talk to nurse

## 2013-08-03 ENCOUNTER — Ambulatory Visit (INDEPENDENT_AMBULATORY_CARE_PROVIDER_SITE_OTHER): Payer: BC Managed Care – PPO | Admitting: Obstetrics & Gynecology

## 2013-08-03 ENCOUNTER — Encounter: Payer: Self-pay | Admitting: Obstetrics & Gynecology

## 2013-08-03 VITALS — BP 118/62 | HR 60 | Resp 12 | Ht 62.25 in | Wt 123.2 lb

## 2013-08-03 DIAGNOSIS — N950 Postmenopausal bleeding: Secondary | ICD-10-CM

## 2013-08-03 NOTE — Progress Notes (Signed)
Subjective:     Patient ID: Elizabeth Estrada, female   DOB: 1940-06-17, 73 y.o.   MRN: [id number]  HPI 73 yo G0 MWF here for several day complaint of darkish vaginal. discharge.  No itching.  No odor.  No urinary symptoms.  No change in bowel habits.  No fevers.  No flank pain.  Pt not sure if related to surgery done 07/06/13--hysteroscopy with polyp resection.  Pathology was negative.  Pt cancelled post op visit after I saw her in the ER due to LLQ pain that was ultimately from a kidney stone.  Has seen a urologist for follow-up.  Cannot recall the name.  Thinks a renal ultrasound is going to be scheduled.  Has another appt scheduled for next month.  Pt was seen in ER on Good Friday.    Review of Systems  All other systems reviewed and are negative.      Objective:   Physical Exam  Constitutional: She is oriented to person, place, and time. She appears well-developed and well-nourished.  Abdominal: Soft. Bowel sounds are normal.  Genitourinary: Uterus normal. There is no rash or tenderness on the right labia. There is no rash or tenderness on the left labia. Uterus is not enlarged, not fixed and not tender. Cervix exhibits no discharge. Right adnexum displays no mass and no tenderness. Left adnexum displays no mass and no tenderness. There is bleeding around the vagina. No vaginal discharge found.  Scant dark mucous discharge from cervical os  Lymphadenopathy:       Right: No inguinal adenopathy present.       Left: No inguinal adenopathy present.  Neurological: She is alert and oriented to person, place, and time.  Skin: Skin is warm and dry.  Psychiatric: She has a normal mood and affect.       Assessment:     PMP bleeding, dark and scant S/P hysteroscopic polyp resection Recent kidney stone    Plan:     Pt reassured this is most likely surgery related.  I do not feel she needs any additional evaluation at this time.  She knows to call if still having this after another four  weeks.  No evidence for infection so will just follow conservative.

## 2013-08-04 ENCOUNTER — Encounter: Payer: Self-pay | Admitting: Obstetrics & Gynecology

## 2014-03-17 ENCOUNTER — Ambulatory Visit (INDEPENDENT_AMBULATORY_CARE_PROVIDER_SITE_OTHER): Payer: BC Managed Care – PPO | Admitting: Nurse Practitioner

## 2014-03-17 ENCOUNTER — Encounter: Payer: Self-pay | Admitting: Nurse Practitioner

## 2014-03-17 ENCOUNTER — Ambulatory Visit: Payer: BC Managed Care – PPO | Admitting: Nurse Practitioner

## 2014-03-17 VITALS — BP 130/82 | HR 64 | Ht 62.5 in | Wt 119.0 lb

## 2014-03-17 DIAGNOSIS — F32A Depression, unspecified: Secondary | ICD-10-CM | POA: Insufficient documentation

## 2014-03-17 DIAGNOSIS — F329 Major depressive disorder, single episode, unspecified: Secondary | ICD-10-CM

## 2014-03-17 DIAGNOSIS — M069 Rheumatoid arthritis, unspecified: Secondary | ICD-10-CM

## 2014-03-17 DIAGNOSIS — M797 Fibromyalgia: Secondary | ICD-10-CM

## 2014-03-17 DIAGNOSIS — Z01419 Encounter for gynecological examination (general) (routine) without abnormal findings: Secondary | ICD-10-CM

## 2014-03-17 DIAGNOSIS — R519 Headache, unspecified: Secondary | ICD-10-CM

## 2014-03-17 DIAGNOSIS — R51 Headache: Secondary | ICD-10-CM

## 2014-03-17 DIAGNOSIS — I341 Nonrheumatic mitral (valve) prolapse: Secondary | ICD-10-CM

## 2014-03-17 NOTE — Progress Notes (Signed)
Patient ID: Elizabeth Estrada, female   DOB: October 28, 1940, 73 y.o.   MRN: [id number] 73 y.o. G0P0 Married Caucasian Fe here for annual exam.  No vaginal bleeding since fibroid and D&C done in March.  Renal stone 07/16/13. patient complains oaf a knife piercing headache left frontal area that is intermittent.  No associated loss of vision, neuropathy, and usually does not last very long.  She had a 'spell' this am while awaiting her appointment.  She does have dizziness associated with this at times.  No motor functions are altered.   Patient's last menstrual period was 04/15/1990.          Sexually active: no  The current method of family planning is abstinence.  Exercising: yes walking occasionally Smoker: no  Health Maintenance: Pap: 09/15/09, Negative  MMG: 06/17/13, Bi-Rads 1:  Negative  Colonoscopy: 02/2014, normal, repeat in 10 years BMD: 10/2008, -1.0/-2.1/-2.0 TDaP: 2012 Shingles: not advised secondary to Embrel Labs: PCP   reports that she has never smoked. She has never used smokeless tobacco. She reports that she does not drink alcohol or use illicit drugs.  Past Medical History  Diagnosis Date  . MVP (mitral valve prolapse)   . Osteoporosis   . Chronic depression   . Melanoma     removed from back  . Rib fracture   . Heart murmur     NO PROBLEMS AS ADULT  . Anxiety   . Rheumatoid arthritis(714.0)     HANDS. KNEES  . Kidney stone     Past Surgical History  Procedure Laterality Date  . Removal of cyst from her left breast Left 8/05    benign - Dr. Margot Chimes  . US echocardiography  06/04/2004    EF 60-65%  . Cardiovascular stress test  10/19/1999, 2013    EF 97%, NO EVIDENCE OF ISCHEMIA, 2013 NORMAL  . Melanoma removed      from back  . Breast surgery      LEFT BREAST BX- BENIGN  . Colonoscopy    . Eye surgery      BILATERAL CATARACT  . Dilatation & currettage/hysteroscopy with resectocope N/A 07/06/2013    Procedure: DILATATION & CURETTAGE/HYSTEROSCOPY WITH  RESECTOCOPE with removal of endometrial lesion;  Surgeon: [name], MD;  Location: [facility];  Service: Gynecology;  Laterality: N/A;    Current Outpatient Prescriptions  Medication Sig Dispense Refill  . aspirin 81 MG tablet Take 81 mg by mouth every other day.     Marland Kitchen CALCIUM PO Take by mouth daily.    . cholecalciferol (VITAMIN D) 1000 UNITS tablet Take 1,000 Units by mouth daily.    . ENBREL 50 MG/ML injection Inject 50 mg into the skin once a week.  1  . escitalopram (LEXAPRO) 10 MG tablet Take 10 mg by mouth daily.    . meloxicam (MOBIC) 15 MG tablet Take 1 tablet by mouth daily.  0  . Multiple Vitamin (MULTIVITAMIN) tablet Take 1 tablet by mouth daily.     No current facility-administered medications for this visit.    Family History  Problem Relation Age of Onset  . Coronary artery disease Mother   . Pancreatic cancer Father   . Heart disease Father     bypass  . Hypertension Father     ROS:  Pertinent items are noted in HPI.  Otherwise, a comprehensive ROS was negative.  Exam:   BP 130/82 mmHg  Pulse 64  Ht 5' 2.5" (1.588 m)  Wt 119 lb (53.978 kg)  BMI 21.41 kg/m2  LMP 04/15/1990 Height: 5' 2.5" (158.8 cm)  Ht Readings from Last 3 Encounters:  03/17/14 5' 2.5" (1.588 m)  08/03/13 5' 2.25" (1.581 m)  07/05/13 5' 2.5" (1.588 m)    General appearance: alert, cooperative and appears stated age Head: Normocephalic, without obvious abnormality, atraumatic Neck: no adenopathy, supple, symmetrical, trachea midline and thyroid normal to inspection and palpation Lungs: clear to auscultation bilaterally Breasts: normal appearance, no masses or tenderness Heart: regular rate and rhythm Abdomen: soft, non-tender; no masses,  no organomegaly Extremities: extremities normal, atraumatic, no cyanosis or edema Skin: Skin color, texture, turgor normal. No rashes or lesions Lymph nodes: Cervical, supraclavicular, and axillary nodes normal. No abnormal inguinal nodes  palpated Neurologic: Grossly normal   Pelvic: External genitalia:  no lesions              Urethra:  normal appearing urethra with no masses, tenderness or lesions              Bartholin's and Skene's: normal                 Vagina: very atrophic appearing vagina with pale color and discharge, no lesions              Cervix: anteverted              Pap taken: No. Bimanual Exam:  Uterus:  normal size, contour, position, consistency, mobility, non-tender              Adnexa: no mass, fullness, tenderness               Rectovaginal: Confirms               Anus:  normal sphincter tone, no lesions  A:  Well Woman with normal exam  Postmenopausal with atrophic vaginitis Endometrial biopsy done 07/06/13 with H' scopic resection of fibroids  Headaches  - fairly new onset  P:   Reviewed health and wellness pertinent to exam  Pap smear not  taken today  Mammogram is due 3/16  Will get referral to [facility] and follow  Counseled on breast self exam, mammography screening, adequate intake of calcium and vitamin D, diet and exercise return annually or prn  An After Visit Summary was printed and given to the patient.

## 2014-03-17 NOTE — Patient Instructions (Addendum)

## 2014-03-27 NOTE — Progress Notes (Signed)
Encounter reviewed by Dr. Brook Silva.  

## 2014-04-20 ENCOUNTER — Other Ambulatory Visit: Payer: Self-pay | Admitting: Specialist

## 2014-04-20 DIAGNOSIS — G4485 Primary stabbing headache: Secondary | ICD-10-CM

## 2014-05-04 ENCOUNTER — Ambulatory Visit
Admission: RE | Admit: 2014-05-04 | Discharge: 2014-05-04 | Disposition: A | Discharge status: Home / Self Care | Payer: BLUE CROSS/BLUE SHIELD | Source: Ambulatory Visit | Attending: Specialist | Admitting: Specialist

## 2014-05-04 DIAGNOSIS — G4485 Primary stabbing headache: Secondary | ICD-10-CM

## 2014-12-21 ENCOUNTER — Telehealth: Payer: Self-pay | Admitting: Obstetrics & Gynecology

## 2014-12-21 DIAGNOSIS — N83201 Unspecified ovarian cyst, right side: Secondary | ICD-10-CM

## 2014-12-21 NOTE — Telephone Encounter (Signed)
I will defer to Dr. Sabra Heck and have her review the CT and her last pelvic ultrasound in our office which was in 02/2013 showing a small but multicystic right ovary.  The CT from [facility] shows a prominent ovary but no specific mass.     Cc- Dr. Sabra Heck.

## 2014-12-21 NOTE — Telephone Encounter (Signed)
Thank you for the update. Copy of the CT scan is in basket outside on wall Dr. Ammie Ferrier office.  Cc- Dr. Sabra Heck

## 2014-12-21 NOTE — Telephone Encounter (Signed)
Spoke with patient. Advised CT shows a prominent ovary but not specific mass. Will have Dr.Miller review upon return to office tomorrow and return call with further recommendations regarding follow up. Patient is agreeable.  Cc: Dr.Miller

## 2014-12-21 NOTE — Telephone Encounter (Signed)
I would go ahead and schedule her for a PUS.  Can we do it next week?  Thanks.

## 2014-12-21 NOTE — Telephone Encounter (Signed)
Spoke with patient. Patient states that she was seen with [facility] yesterday for treatment of kidney stones. Had imaging performed and was advised she has a cyst on her right ovary. Patient states she is having some lower back pain but feels this is mainly from kidney stones. Denies any pelvic or RLQ pain. Patient is asking if she will need follow up with our office. Had PUS in our office on 03/04/2013. Last aex was performed on 03/17/2014. Advised will have MD review imaging results and make further recommendations. Patient is agreeable. Spoke with [facility] CT imaging results to be faxed to our office for MD review.

## 2014-12-21 NOTE — Telephone Encounter (Signed)
Patient was seen at her urologist for kidney stone and an ovarian cyst was found on the ultrasound.

## 2014-12-21 NOTE — Telephone Encounter (Signed)
Spoke with patient. Advised of message as seen below from [place]. Patient is agreeable and would like to schedule at this time. Ultrasound scheduled for 12/29/2014 at 1:30 pm with 2 pm consult with Dr.Miller. Patient is agreeable to date and time. Patient states that she has never been able to have a transvaginal ultrasound in the past due to discomfort. Always has to have abdominal ultrasounds. Advised I will speak with Dr.Miller regarding recommendations and return call. Patient is agreeable.  Dr.Miller, okay for patient to proceed with transabdominal ultrasound in office?

## 2014-12-22 NOTE — Telephone Encounter (Signed)
Spoke with patient. Advised of message as seen below from [place]. Patient is agreeable and verbalizes understanding. Note placed for transabdominal ultrasound only on appointment. Order placed for precert.  Cc: Theresia Lo  Routing to provider for final review. Patient agreeable to disposition. Will close encounter.

## 2014-12-22 NOTE — Telephone Encounter (Signed)
Yes.  Ok for transabdominal ultrasound.  Needs to drink about 16 oz water before appt and come with a full bladder.  Please make note for transabd ultrasound only.

## 2014-12-23 ENCOUNTER — Other Ambulatory Visit: Payer: Self-pay

## 2014-12-23 DIAGNOSIS — N83201 Unspecified ovarian cyst, right side: Secondary | ICD-10-CM

## 2014-12-26 ENCOUNTER — Telehealth: Payer: Self-pay | Admitting: Obstetrics & Gynecology

## 2014-12-26 NOTE — Telephone Encounter (Signed)
Spoke with patient. Reviewed benefit for procedure. Patient understood and agreeable. Verified arrival date/time for procedure 12/29/14. Patient agreeable.  While on the phone, patient stated she had a question/concern regarding the test due a recent kidney stone. She is unsure if she's passed it and would like to speak with a nurse if any recommendations.  Routing to triage

## 2014-12-26 NOTE — Telephone Encounter (Signed)
Spoke with patient. Patient states that she is worried she will not be able to the water for the ultrasound. "I had a kidney stone that I think I passed but I am not sure. It was causing me to have a lot of pressure and go to the bathroom a lot but I think that is going away." Advised patient will need to drink 16 oz of water prior to appointment. Advised may drink half of the water before coming and the rest when she arrives at the office. This will keep her from having to use the bathroom so urgently when she gets to the office. Can not use the restroom before the appointment as her bladder needs to be full. Patient is agreeable.  Routing to provider for final review. Patient agreeable to disposition. Will close encounter.

## 2014-12-29 ENCOUNTER — Ambulatory Visit (INDEPENDENT_AMBULATORY_CARE_PROVIDER_SITE_OTHER): Payer: BLUE CROSS/BLUE SHIELD | Admitting: Obstetrics & Gynecology

## 2014-12-29 ENCOUNTER — Ambulatory Visit (INDEPENDENT_AMBULATORY_CARE_PROVIDER_SITE_OTHER): Payer: BLUE CROSS/BLUE SHIELD

## 2014-12-29 VITALS — BP 120/80 | HR 70

## 2014-12-29 DIAGNOSIS — N83201 Unspecified ovarian cyst, right side: Secondary | ICD-10-CM

## 2014-12-29 DIAGNOSIS — N200 Calculus of kidney: Secondary | ICD-10-CM | POA: Diagnosis not present

## 2014-12-29 DIAGNOSIS — N8320 Unspecified ovarian cysts: Secondary | ICD-10-CM | POA: Diagnosis not present

## 2014-12-29 DIAGNOSIS — D399 Neoplasm of uncertain behavior of female genital organ, unspecified: Secondary | ICD-10-CM

## 2014-12-29 NOTE — Progress Notes (Signed)
74 y.o. G0P0 Marriedfemale here for a pelvic ultrasound after having CT evaluation (for nephrolithiasis) showing a "prominent right ovary".  Denies vaginal bleeding.    Patient's last menstrual period was 04/15/1990.  Sexually active:  no  Contraception: abstinence  FINDINGS: (transabdominal only PUS performed) UTERUS: 6.0 x 4.5cm x 3.1cm EMS: difficult to visualize but no masses or vascularity noted ADNEXA:   Left ovary 3.0 x 1.5 x 1.5cm with 72mm and 36mm cysts   Right ovary 4.6 x 3.1 x 2.4cm with 48mm, 42mm, and 43mm cysts CUL DE SAC: no free fluid  Finding reviewed with pt.  Ovary on right is enlarged and there are several small cysts noted.  These are all avascular and echo free on both ovaries.  Recommend proceeding with Ca-125.    Assessment:  Enlarged right ovary with bilateral small, avascular, and echofree cysts  Plan: ca-125.  Will make additional recommendations after results are finalized.  ~15 minutes spent with patient >50% of time was in face to face discussion of above.

## 2014-12-30 LAB — CA 125: CA 125: 3 U/mL (ref <35)

## 2015-01-02 ENCOUNTER — Encounter: Payer: Self-pay | Admitting: Obstetrics & Gynecology

## 2015-01-02 DIAGNOSIS — N200 Calculus of kidney: Secondary | ICD-10-CM | POA: Insufficient documentation

## 2015-01-02 DIAGNOSIS — N83201 Unspecified ovarian cyst, right side: Secondary | ICD-10-CM | POA: Insufficient documentation

## 2015-06-21 ENCOUNTER — Ambulatory Visit
Admission: RE | Admit: 2015-06-21 | Discharge: 2015-06-21 | Disposition: A | Discharge status: Home / Self Care | Payer: BLUE CROSS/BLUE SHIELD | Source: Ambulatory Visit | Attending: Family Medicine | Admitting: Family Medicine

## 2015-06-21 ENCOUNTER — Other Ambulatory Visit: Payer: Self-pay | Admitting: Family Medicine

## 2015-06-21 DIAGNOSIS — R05 Cough: Secondary | ICD-10-CM

## 2015-06-21 DIAGNOSIS — R059 Cough, unspecified: Secondary | ICD-10-CM

## 2015-07-11 ENCOUNTER — Ambulatory Visit
Admission: RE | Admit: 2015-07-11 | Discharge: 2015-07-11 | Disposition: A | Discharge status: Home / Self Care | Payer: BLUE CROSS/BLUE SHIELD | Source: Ambulatory Visit | Attending: Family Medicine | Admitting: Family Medicine

## 2015-07-11 ENCOUNTER — Other Ambulatory Visit: Payer: Self-pay | Admitting: Family Medicine

## 2015-07-11 DIAGNOSIS — R079 Chest pain, unspecified: Secondary | ICD-10-CM

## 2015-07-11 DIAGNOSIS — R0789 Other chest pain: Secondary | ICD-10-CM

## 2015-07-11 DIAGNOSIS — R0781 Pleurodynia: Secondary | ICD-10-CM

## 2015-10-12 DIAGNOSIS — M5136 Other intervertebral disc degeneration, lumbar region: Secondary | ICD-10-CM | POA: Diagnosis not present

## 2015-10-12 DIAGNOSIS — M150 Primary generalized (osteo)arthritis: Secondary | ICD-10-CM | POA: Diagnosis not present

## 2015-10-12 DIAGNOSIS — M0579 Rheumatoid arthritis with rheumatoid factor of multiple sites without organ or systems involvement: Secondary | ICD-10-CM | POA: Diagnosis not present

## 2015-10-12 DIAGNOSIS — M25461 Effusion, right knee: Secondary | ICD-10-CM | POA: Diagnosis not present

## 2015-10-12 DIAGNOSIS — M810 Age-related osteoporosis without current pathological fracture: Secondary | ICD-10-CM | POA: Diagnosis not present

## 2015-11-01 ENCOUNTER — Encounter (HOSPITAL_COMMUNITY): Payer: Self-pay | Admitting: Emergency Medicine

## 2015-11-01 ENCOUNTER — Emergency Department (HOSPITAL_COMMUNITY): Payer: BLUE CROSS/BLUE SHIELD

## 2015-11-01 ENCOUNTER — Emergency Department (HOSPITAL_COMMUNITY)
Admission: EM | Admit: 2015-11-01 | Discharge: 2015-11-01 | Disposition: A | Discharge status: Home / Self Care | Payer: BLUE CROSS/BLUE SHIELD | Attending: Emergency Medicine | Admitting: Emergency Medicine

## 2015-11-01 DIAGNOSIS — Z7982 Long term (current) use of aspirin: Secondary | ICD-10-CM | POA: Insufficient documentation

## 2015-11-01 DIAGNOSIS — N200 Calculus of kidney: Secondary | ICD-10-CM | POA: Diagnosis not present

## 2015-11-01 DIAGNOSIS — R1032 Left lower quadrant pain: Secondary | ICD-10-CM | POA: Diagnosis not present

## 2015-11-01 DIAGNOSIS — Z85820 Personal history of malignant melanoma of skin: Secondary | ICD-10-CM | POA: Diagnosis not present

## 2015-11-01 DIAGNOSIS — R109 Unspecified abdominal pain: Secondary | ICD-10-CM

## 2015-11-01 LAB — COMPREHENSIVE METABOLIC PANEL
ALT: 19 U/L (ref 14–54)
AST: 25 U/L (ref 15–41)
Albumin: 3.9 g/dL (ref 3.5–5.0)
Alkaline Phosphatase: 66 U/L (ref 38–126)
Anion gap: 5 (ref 5–15)
BUN: 30 mg/dL — ABNORMAL HIGH (ref 6–20)
CO2: 26 mmol/L (ref 22–32)
Calcium: 9.1 mg/dL (ref 8.9–10.3)
Chloride: 110 mmol/L (ref 101–111)
Creatinine, Ser: 1.05 mg/dL — ABNORMAL HIGH (ref 0.44–1.00)
GFR calc Af Amer: 59 mL/min — ABNORMAL LOW (ref >60)
GFR calc non Af Amer: 51 mL/min — ABNORMAL LOW (ref >60)
Glucose, Bld: 108 mg/dL — ABNORMAL HIGH (ref 65–99)
Potassium: 4.1 mmol/L (ref 3.5–5.1)
Sodium: 141 mmol/L (ref 135–145)
Total Bilirubin: 0.5 mg/dL (ref 0.3–1.2)
Total Protein: 7.3 g/dL (ref 6.5–8.1)

## 2015-11-01 LAB — URINALYSIS, ROUTINE W REFLEX MICROSCOPIC
Bilirubin Urine: NEGATIVE
Glucose, UA: NEGATIVE mg/dL
Ketones, ur: NEGATIVE mg/dL
Nitrite: NEGATIVE
Protein, ur: NEGATIVE mg/dL
Specific Gravity, Urine: 1.019 (ref 1.005–1.030)
pH: 5.5 (ref 5.0–8.0)

## 2015-11-01 LAB — CBC
HCT: 40.5 % (ref 36.0–46.0)
Hemoglobin: 13 g/dL (ref 12.0–15.0)
MCH: 28.9 pg (ref 26.0–34.0)
MCHC: 32.1 g/dL (ref 30.0–36.0)
MCV: 90 fL (ref 78.0–100.0)
Platelets: 165 10*3/uL (ref 150–400)
RBC: 4.5 MIL/uL (ref 3.87–5.11)
RDW: 13.8 % (ref 11.5–15.5)
WBC: 6.6 10*3/uL (ref 4.0–10.5)

## 2015-11-01 LAB — URINE MICROSCOPIC-ADD ON

## 2015-11-01 MED ORDER — KETOROLAC TROMETHAMINE 15 MG/ML IJ SOLN
15.0000 mg | Freq: Once | INTRAMUSCULAR | Status: AC
  Administered 2015-11-01: 15 mg via INTRAVENOUS
  Filled 2015-11-01: qty 1

## 2015-11-01 MED ORDER — OXYCODONE-ACETAMINOPHEN 5-325 MG PO TABS: 1.0000 | ORAL_TABLET | ORAL | Status: DC | PRN

## 2015-11-01 MED ORDER — ONDANSETRON HCL 4 MG/2ML IJ SOLN
4.0000 mg | Freq: Once | INTRAMUSCULAR | Status: AC
  Administered 2015-11-01: 4 mg via INTRAVENOUS
  Filled 2015-11-01: qty 2

## 2015-11-01 MED ORDER — SODIUM CHLORIDE 0.9 % IV BOLUS (SEPSIS)
1000.0000 mL | Freq: Once | INTRAVENOUS | Status: AC
  Administered 2015-11-01: 1000 mL via INTRAVENOUS

## 2015-11-01 MED ORDER — HYDROMORPHONE HCL 1 MG/ML IJ SOLN
0.7500 mg | Freq: Once | INTRAMUSCULAR | Status: AC
Start: 2015-11-01 — End: 2015-11-01
  Administered 2015-11-01: 0.75 mg via INTRAVENOUS
  Filled 2015-11-01: qty 1

## 2015-11-01 NOTE — ED Notes (Signed)
Patient complaining of left flank pain. Patient has a hx of kidney stones. Patient is complaining of frequent urination. This started two days ago.

## 2015-11-01 NOTE — Discharge Instructions (Signed)
Kidney Stones °Kidney stones (urolithiasis) are deposits that form inside your kidneys. The intense pain is caused by the stone moving through the urinary tract. When the stone moves, the ureter goes into spasm around the stone. The stone is usually passed in the urine.  °CAUSES  °· A disorder that makes certain neck glands produce too much parathyroid hormone (primary hyperparathyroidism). °· A buildup of uric acid crystals, similar to gout in your joints. °· Narrowing (stricture) of the ureter. °· A kidney obstruction present at birth (congenital obstruction). °· Previous surgery on the kidney or ureters. °· Numerous kidney infections. °SYMPTOMS  °· Feeling sick to your stomach (nauseous). °· Throwing up (vomiting). °· Blood in the urine (hematuria). °· Pain that usually spreads (radiates) to the groin. °· Frequency or urgency of urination. °DIAGNOSIS  °· Taking a history and physical exam. °· Blood or urine tests. °· CT scan. °· Occasionally, an examination of the inside of the urinary bladder (cystoscopy) is performed. °TREATMENT  °· Observation. °· Increasing your fluid intake. °· Extracorporeal shock wave lithotripsy--This is a noninvasive procedure that uses shock waves to break up kidney stones. °· Surgery may be needed if you have severe pain or persistent obstruction. There are various surgical procedures. Most of the procedures are performed with the use of small instruments. Only small incisions are needed to accommodate these instruments, so recovery time is minimized. °The size, location, and chemical composition are all important variables that will determine the proper choice of action for you. Talk to your health care provider to better understand your situation so that you will minimize the risk of injury to yourself and your kidney.  °HOME CARE INSTRUCTIONS  °· Drink enough water and fluids to keep your urine clear or pale yellow. This will help you to pass the stone or stone fragments. °· Strain  all urine through the provided strainer. Keep all particulate matter and stones for your health care provider to see. The stone causing the pain may be as small as a grain of salt. It is very important to use the strainer each and every time you pass your urine. The collection of your stone will allow your health care provider to analyze it and verify that a stone has actually passed. The stone analysis will often identify what you can do to reduce the incidence of recurrences. °· Only take over-the-counter or prescription medicines for pain, discomfort, or fever as directed by your health care provider. °· Keep all follow-up visits as told by your health care provider. This is important. °· Get follow-up X-rays if required. The absence of pain does not always mean that the stone has passed. It may have only stopped moving. If the urine remains completely obstructed, it can cause loss of kidney function or even complete destruction of the kidney. It is your responsibility to make sure X-rays and follow-ups are completed. Ultrasounds of the kidney can show blockages and the status of the kidney. Ultrasounds are not associated with any radiation and can be performed easily in a matter of minutes. °· Make changes to your daily diet as told by your health care provider. You may be told to: °¨ Limit the amount of salt that you eat. °¨ Eat 5 or more servings of fruits and vegetables each day. °¨ Limit the amount of meat, poultry, fish, and eggs that you eat. °· Collect a 24-hour urine sample as told by your health care provider. You may need to collect another urine sample every 6-12   months. °SEEK MEDICAL CARE IF: °· You experience pain that is progressive and unresponsive to any pain medicine you have been prescribed. °SEEK IMMEDIATE MEDICAL CARE IF:  °· Pain cannot be controlled with the prescribed medicine. °· You have a fever or shaking chills. °· The severity or intensity of pain increases over 18 hours and is not  relieved by pain medicine. °· You develop a new onset of abdominal pain. °· You feel faint or pass out. °· You are unable to urinate. °  °This information is not intended to replace advice given to you by your health care provider. Make sure you discuss any questions you have with your health care provider. °  °Document Released: 04/01/2005 Document Revised: 12/21/2014 Document Reviewed: 09/02/2012 °Elsevier Interactive Patient Education ©2016 [employer] ° °

## 2015-11-01 NOTE — ED Provider Notes (Signed)
CSN: [id number]     Arrival date & time 11/01/15  0202 History  By signing my name below, I, Emmanuella Mensah, attest that this documentation has been prepared under the direction and in the presence of Virgel Manifold, MD. Electronically Signed: Judithann Sauger, ED Scribe. 11/01/2015. 3:24 AM.     Chief Complaint  Patient presents with  . Flank Pain   The history is provided by the patient. No language interpreter was used.   HPI Comments: Elizabeth Estrada is a 75 y.o. female with a hx of kidney stones who presents to the Emergency Department complaining of gradually worsening moderate left flank pain onset several hours ago. He reports associated discomfort in her LLQ. No alleviating factors noted. Pt has not tried any medications PTA. She states that these symptoms are consistent with her hx of kidney stones. She adds that she normally passes the stones on her own. She denies any dysuria, fever, chills, nausea, vomiting, or diarrhea.   Past Medical History  Diagnosis Date  . MVP (mitral valve prolapse)   . Osteoporosis   . Chronic depression   . Melanoma ([place])     removed from back  . Rib fracture   . Heart murmur     NO PROBLEMS AS ADULT  . Anxiety   . Rheumatoid arthritis(714.0)     HANDS. KNEES  . Kidney stone    Past Surgical History  Procedure Laterality Date  . Removal of cyst from her left breast Left 8/05    benign - Dr. Margot Chimes  . US echocardiography  06/04/2004    EF 60-65%  . Cardiovascular stress test  10/19/1999, 2013    EF 97%, NO EVIDENCE OF ISCHEMIA, 2013 NORMAL  . Melanoma removed      from back  . Breast surgery      LEFT BREAST BX- BENIGN  . Colonoscopy    . Eye surgery      BILATERAL CATARACT  . Dilatation & currettage/hysteroscopy with resectocope N/A 07/06/2013    Procedure: DILATATION & CURETTAGE/HYSTEROSCOPY WITH RESECTOCOPE with removal of endometrial lesion;  Surgeon: [name], MD;  Location: [facility];  Service: Gynecology;  Laterality: N/A;    Family History  Problem Relation Age of Onset  . Coronary artery disease Mother   . Pancreatic cancer Father   . Heart disease Father     bypass  . Hypertension Father    Social History  Substance Use Topics  . Smoking status: Never Smoker   . Smokeless tobacco: Never Used  . Alcohol Use: No   OB History    Gravida Para Term Preterm AB TAB SAB Ectopic Multiple Living   0 0             Review of Systems  Constitutional: Negative for fever and chills.  Gastrointestinal: Positive for abdominal pain. Negative for nausea, vomiting and diarrhea.  Genitourinary: Positive for flank pain. Negative for dysuria.  All other systems reviewed and are negative.     Allergies  Methotrexate derivatives; Plaquenil; Remicade; Ridaura; and Sudafed  Home Medications   Prior to Admission medications   Medication Sig Start Date End Date Taking? Authorizing Provider  aspirin 81 MG tablet Take 81 mg by mouth every other day.    Yes Historical Provider, MD  CALCIUM PO Take by mouth daily.   Yes Historical Provider, MD  cholecalciferol (VITAMIN D) 1000 UNITS tablet Take 1,000 Units by mouth daily.   Yes Historical Provider, MD  escitalopram (LEXAPRO) 10 MG tablet  Take 10 mg by mouth daily.   Yes Historical Provider, MD  Multiple Vitamin (MULTIVITAMIN) tablet Take 1 tablet by mouth daily.   Yes Historical Provider, MD  PRESCRIPTION MEDICATION 1 each as directed.   Yes Historical Provider, MD  sodium chloride (OCEAN) 0.65 % SOLN nasal spray Place 1 spray into both nostrils as needed for congestion.   Yes Historical Provider, MD   BP 188/73 mmHg  Pulse 63  Temp(Src) 97.6 F (36.4 C) (Oral)  Resp 20  Ht 5\' 2"  (1.575 m)  Wt 119 lb (53.978 kg)  BMI 21.76 kg/m2  SpO2 98%  LMP 04/15/1990 Physical Exam  Constitutional: She appears well-developed and well-nourished. No distress.  HENT:  Head: Normocephalic and atraumatic.  Right Ear: External ear normal.  Left Ear: External ear normal.   Eyes: Conjunctivae are normal. Right eye exhibits no discharge. Left eye exhibits no discharge. No scleral icterus.  Neck: Neck supple. No tracheal deviation present.  Cardiovascular: Normal rate, regular rhythm and intact distal pulses.   Pulmonary/Chest: Effort normal and breath sounds normal. No stridor. No respiratory distress. She has no wheezes. She has no rales.  Abdominal: Soft. Bowel sounds are normal. She exhibits no distension. There is tenderness. There is no rebound and no guarding.  Left abdominal tenderness  Musculoskeletal: She exhibits no edema or tenderness.  Neurological: She is alert. She has normal strength. No cranial nerve deficit (no facial droop, extraocular movements intact, no slurred speech) or sensory deficit. She exhibits normal muscle tone. She displays no seizure activity. Coordination normal.  Skin: Skin is warm and dry. No rash noted.  Psychiatric: She has a normal mood and affect.  Nursing note and vitals reviewed.   ED Course  Procedures (including critical care time) DIAGNOSTIC STUDIES: Oxygen Saturation is 98% on R, normal by my interpretation.    COORDINATION OF CARE: 3:17 AM- Pt advised of plan for treatment and pt agrees. Pt will receive lab work and CT scan for further evaluation.    Labs Review Labs Reviewed  URINALYSIS, ROUTINE W REFLEX MICROSCOPIC (NOT AT [facility]) - Abnormal; Notable for the following:       Result Value   APPearance CLOUDY (*)    Hgb urine dipstick LARGE (*)    Leukocytes, UA TRACE (*)    All other components within normal limits  COMPREHENSIVE METABOLIC PANEL - Abnormal; Notable for the following:    Glucose, Bld 108 (*)    BUN 30 (*)    Creatinine, Ser 1.05 (*)    GFR calc non Af Amer 51 (*)    GFR calc Af Amer 59 (*)    All other components within normal limits  URINE MICROSCOPIC-ADD ON - Abnormal; Notable for the following:    Squamous Epithelial / LPF 0-5 (*)    Bacteria, UA FEW (*)    All other components  within normal limits  CBC    Imaging Review No results found.   Ct Abdomen Pelvis Wo Contrast  Result Date: 11/01/2015 CLINICAL DATA:  Left flank pain. EXAM: CT ABDOMEN AND PELVIS WITHOUT CONTRAST TECHNIQUE: Multidetector CT imaging of the abdomen and pelvis was performed following the standard protocol without IV contrast. 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 Pancreas: Unremarkable. Spleen: Unremarkable. Adrenals/Urinary Tract: Negative adrenals. 4 mm left UVJ calculus with moderate left hydronephrosis. Punctate right upper pole  and left lower pole calculi. Stable presumed 12 mm cyst in the lower pole right kidney. Unremarkable bladder. Stomach/Bowel:  No obstruction. No appendicitis. Reproductive:Complex cyst in the right ovary in total measuring up to 28 mm, with internal septations and minimal mural calcification. Overall dimensions are similar to 2016 CT. Pelvic ultrasound was obtained 08/05/2013 and 12/29/2014. Stable prominent left ovarian size for age. Vascular/Lymphatic: No acute vascular abnormality. No mass or adenopathy. Other: No ascites or pneumoperitoneum. Musculoskeletal: No acute abnormalities. Focally advanced L3-4 disc degeneration. IMPRESSION: 1. Obstructing 4 mm left UVJ calculus. 2. Punctate bilateral nephrolithiasis. 3. Complex 28 mm right ovarian cyst, stable from comparison exam. Most recent pelvic ultrasound obtained September 2016. Electronically Signed   By: Monte Fantasia M.D.   On: 11/01/2015 04:45     Virgel Manifold, MD has personally reviewed and evaluated these images and lab results as part of his medical decision-making.   EKG Interpretation None       MDM   Final diagnoses:  Abdominal pain  Kidney stone  75 year old female flank pain. Imaging significant for  left ureteral stone. Symptoms now controlled. I feel she is appropriate for expectant management and outpatient follow-up as needed. Return precautions were discussed.   Virgel Manifold, MD 11/16/15 2204

## 2015-11-03 DIAGNOSIS — N201 Calculus of ureter: Secondary | ICD-10-CM | POA: Diagnosis not present

## 2015-11-17 DIAGNOSIS — M5136 Other intervertebral disc degeneration, lumbar region: Secondary | ICD-10-CM | POA: Diagnosis not present

## 2015-11-17 DIAGNOSIS — M0579 Rheumatoid arthritis with rheumatoid factor of multiple sites without organ or systems involvement: Secondary | ICD-10-CM | POA: Diagnosis not present

## 2015-11-17 DIAGNOSIS — M810 Age-related osteoporosis without current pathological fracture: Secondary | ICD-10-CM | POA: Diagnosis not present

## 2015-11-17 DIAGNOSIS — M150 Primary generalized (osteo)arthritis: Secondary | ICD-10-CM | POA: Diagnosis not present

## 2015-11-17 DIAGNOSIS — M25461 Effusion, right knee: Secondary | ICD-10-CM | POA: Diagnosis not present

## 2015-12-15 ENCOUNTER — Other Ambulatory Visit: Payer: Self-pay | Admitting: Obstetrics & Gynecology

## 2015-12-15 DIAGNOSIS — N83201 Unspecified ovarian cyst, right side: Secondary | ICD-10-CM

## 2015-12-15 DIAGNOSIS — N83202 Unspecified ovarian cyst, left side: Secondary | ICD-10-CM

## 2015-12-19 ENCOUNTER — Telehealth: Payer: Self-pay | Admitting: Obstetrics & Gynecology

## 2015-12-19 NOTE — Telephone Encounter (Signed)
Called patient to review benefits for a recommended procedure. Left Voicemail requesting a call back. °

## 2015-12-21 NOTE — Telephone Encounter (Signed)
Spoke with patient regarding scheduling the recommended one year follow up ultrasound. Patient states her spouse has been sick for 9 months and she is the caregiver for him. Patient declines to schedule the ultrasound appointment at this time. States she will call back when her spouse is doing better.   Routing to Dr Sabra Heck for review.

## 2015-12-22 NOTE — Telephone Encounter (Signed)
Thank you for the information.  Ok to close encounter.

## 2015-12-22 NOTE — Telephone Encounter (Signed)
Ok to close encounter. 

## 2016-01-19 DIAGNOSIS — H90A22 Sensorineural hearing loss, unilateral, left ear, with restricted hearing on the contralateral side: Secondary | ICD-10-CM | POA: Diagnosis not present

## 2016-01-19 DIAGNOSIS — H90A31 Mixed conductive and sensorineural hearing loss, unilateral, right ear with restricted hearing on the contralateral side: Secondary | ICD-10-CM | POA: Diagnosis not present

## 2016-01-26 DIAGNOSIS — M0579 Rheumatoid arthritis with rheumatoid factor of multiple sites without organ or systems involvement: Secondary | ICD-10-CM | POA: Diagnosis not present

## 2016-01-29 DIAGNOSIS — M25461 Effusion, right knee: Secondary | ICD-10-CM | POA: Diagnosis not present

## 2016-01-29 DIAGNOSIS — M0579 Rheumatoid arthritis with rheumatoid factor of multiple sites without organ or systems involvement: Secondary | ICD-10-CM | POA: Diagnosis not present

## 2016-01-29 DIAGNOSIS — M150 Primary generalized (osteo)arthritis: Secondary | ICD-10-CM | POA: Diagnosis not present

## 2016-01-29 DIAGNOSIS — M810 Age-related osteoporosis without current pathological fracture: Secondary | ICD-10-CM | POA: Diagnosis not present

## 2016-01-29 DIAGNOSIS — M5136 Other intervertebral disc degeneration, lumbar region: Secondary | ICD-10-CM | POA: Diagnosis not present

## 2016-02-13 DIAGNOSIS — F430 Acute stress reaction: Secondary | ICD-10-CM | POA: Diagnosis not present

## 2016-02-13 DIAGNOSIS — R000 Tachycardia, unspecified: Secondary | ICD-10-CM | POA: Diagnosis not present

## 2016-02-23 DIAGNOSIS — M0579 Rheumatoid arthritis with rheumatoid factor of multiple sites without organ or systems involvement: Secondary | ICD-10-CM | POA: Diagnosis not present

## 2016-03-12 DIAGNOSIS — M810 Age-related osteoporosis without current pathological fracture: Secondary | ICD-10-CM | POA: Diagnosis not present

## 2016-03-12 DIAGNOSIS — M150 Primary generalized (osteo)arthritis: Secondary | ICD-10-CM | POA: Diagnosis not present

## 2016-03-12 DIAGNOSIS — M25461 Effusion, right knee: Secondary | ICD-10-CM | POA: Diagnosis not present

## 2016-03-12 DIAGNOSIS — Z6821 Body mass index (BMI) 21.0-21.9, adult: Secondary | ICD-10-CM | POA: Diagnosis not present

## 2016-03-12 DIAGNOSIS — M5136 Other intervertebral disc degeneration, lumbar region: Secondary | ICD-10-CM | POA: Diagnosis not present

## 2016-03-12 DIAGNOSIS — M0579 Rheumatoid arthritis with rheumatoid factor of multiple sites without organ or systems involvement: Secondary | ICD-10-CM | POA: Diagnosis not present

## 2016-03-20 DIAGNOSIS — L821 Other seborrheic keratosis: Secondary | ICD-10-CM | POA: Diagnosis not present

## 2016-03-22 DIAGNOSIS — M0579 Rheumatoid arthritis with rheumatoid factor of multiple sites without organ or systems involvement: Secondary | ICD-10-CM | POA: Diagnosis not present

## 2016-04-19 DIAGNOSIS — M0579 Rheumatoid arthritis with rheumatoid factor of multiple sites without organ or systems involvement: Secondary | ICD-10-CM | POA: Diagnosis not present

## 2016-04-19 DIAGNOSIS — Z79899 Other long term (current) drug therapy: Secondary | ICD-10-CM | POA: Diagnosis not present

## 2016-05-09 DIAGNOSIS — J069 Acute upper respiratory infection, unspecified: Secondary | ICD-10-CM | POA: Diagnosis not present

## 2016-05-09 DIAGNOSIS — R05 Cough: Secondary | ICD-10-CM | POA: Diagnosis not present

## 2016-05-17 DIAGNOSIS — M0579 Rheumatoid arthritis with rheumatoid factor of multiple sites without organ or systems involvement: Secondary | ICD-10-CM | POA: Diagnosis not present

## 2016-05-31 DIAGNOSIS — M810 Age-related osteoporosis without current pathological fracture: Secondary | ICD-10-CM | POA: Diagnosis not present

## 2016-05-31 DIAGNOSIS — Z6821 Body mass index (BMI) 21.0-21.9, adult: Secondary | ICD-10-CM | POA: Diagnosis not present

## 2016-05-31 DIAGNOSIS — M150 Primary generalized (osteo)arthritis: Secondary | ICD-10-CM | POA: Diagnosis not present

## 2016-05-31 DIAGNOSIS — M0579 Rheumatoid arthritis with rheumatoid factor of multiple sites without organ or systems involvement: Secondary | ICD-10-CM | POA: Diagnosis not present

## 2016-05-31 DIAGNOSIS — M5136 Other intervertebral disc degeneration, lumbar region: Secondary | ICD-10-CM | POA: Diagnosis not present

## 2016-05-31 DIAGNOSIS — M25461 Effusion, right knee: Secondary | ICD-10-CM | POA: Diagnosis not present

## 2016-06-18 DIAGNOSIS — M0579 Rheumatoid arthritis with rheumatoid factor of multiple sites without organ or systems involvement: Secondary | ICD-10-CM | POA: Diagnosis not present

## 2016-06-27 DIAGNOSIS — N200 Calculus of kidney: Secondary | ICD-10-CM | POA: Diagnosis not present

## 2016-07-05 DIAGNOSIS — H40053 Ocular hypertension, bilateral: Secondary | ICD-10-CM | POA: Diagnosis not present

## 2016-07-05 DIAGNOSIS — H11423 Conjunctival edema, bilateral: Secondary | ICD-10-CM | POA: Diagnosis not present

## 2016-07-05 DIAGNOSIS — H52223 Regular astigmatism, bilateral: Secondary | ICD-10-CM | POA: Diagnosis not present

## 2016-07-05 DIAGNOSIS — H04123 Dry eye syndrome of bilateral lacrimal glands: Secondary | ICD-10-CM | POA: Diagnosis not present

## 2016-07-05 DIAGNOSIS — H40003 Preglaucoma, unspecified, bilateral: Secondary | ICD-10-CM | POA: Diagnosis not present

## 2016-07-05 DIAGNOSIS — H18413 Arcus senilis, bilateral: Secondary | ICD-10-CM | POA: Diagnosis not present

## 2016-07-05 DIAGNOSIS — H40023 Open angle with borderline findings, high risk, bilateral: Secondary | ICD-10-CM | POA: Diagnosis not present

## 2016-07-05 DIAGNOSIS — H26493 Other secondary cataract, bilateral: Secondary | ICD-10-CM | POA: Diagnosis not present

## 2016-07-05 DIAGNOSIS — Z961 Presence of intraocular lens: Secondary | ICD-10-CM | POA: Diagnosis not present

## 2016-07-05 DIAGNOSIS — H11153 Pinguecula, bilateral: Secondary | ICD-10-CM | POA: Diagnosis not present

## 2016-07-05 DIAGNOSIS — H524 Presbyopia: Secondary | ICD-10-CM | POA: Diagnosis not present

## 2016-07-05 DIAGNOSIS — H5203 Hypermetropia, bilateral: Secondary | ICD-10-CM | POA: Diagnosis not present

## 2016-07-17 DIAGNOSIS — M0579 Rheumatoid arthritis with rheumatoid factor of multiple sites without organ or systems involvement: Secondary | ICD-10-CM | POA: Diagnosis not present

## 2016-07-30 DIAGNOSIS — J069 Acute upper respiratory infection, unspecified: Secondary | ICD-10-CM | POA: Diagnosis not present

## 2016-08-07 DIAGNOSIS — Z1231 Encounter for screening mammogram for malignant neoplasm of breast: Secondary | ICD-10-CM | POA: Diagnosis not present

## 2016-08-14 DIAGNOSIS — M0579 Rheumatoid arthritis with rheumatoid factor of multiple sites without organ or systems involvement: Secondary | ICD-10-CM | POA: Diagnosis not present

## 2016-08-20 ENCOUNTER — Encounter: Payer: Self-pay | Admitting: Obstetrics and Gynecology

## 2016-09-11 DIAGNOSIS — M0579 Rheumatoid arthritis with rheumatoid factor of multiple sites without organ or systems involvement: Secondary | ICD-10-CM | POA: Diagnosis not present

## 2016-10-14 DIAGNOSIS — Z79899 Other long term (current) drug therapy: Secondary | ICD-10-CM | POA: Diagnosis not present

## 2016-10-14 DIAGNOSIS — M0579 Rheumatoid arthritis with rheumatoid factor of multiple sites without organ or systems involvement: Secondary | ICD-10-CM | POA: Diagnosis not present

## 2016-11-13 DIAGNOSIS — M0579 Rheumatoid arthritis with rheumatoid factor of multiple sites without organ or systems involvement: Secondary | ICD-10-CM | POA: Diagnosis not present

## 2016-11-19 DIAGNOSIS — D1801 Hemangioma of skin and subcutaneous tissue: Secondary | ICD-10-CM | POA: Diagnosis not present

## 2016-11-19 DIAGNOSIS — D2262 Melanocytic nevi of left upper limb, including shoulder: Secondary | ICD-10-CM | POA: Diagnosis not present

## 2016-11-19 DIAGNOSIS — D692 Other nonthrombocytopenic purpura: Secondary | ICD-10-CM | POA: Diagnosis not present

## 2016-11-19 DIAGNOSIS — L821 Other seborrheic keratosis: Secondary | ICD-10-CM | POA: Diagnosis not present

## 2016-11-19 DIAGNOSIS — Z85820 Personal history of malignant melanoma of skin: Secondary | ICD-10-CM | POA: Diagnosis not present

## 2016-11-19 DIAGNOSIS — L853 Xerosis cutis: Secondary | ICD-10-CM | POA: Diagnosis not present

## 2016-11-19 DIAGNOSIS — D225 Melanocytic nevi of trunk: Secondary | ICD-10-CM | POA: Diagnosis not present

## 2016-11-19 DIAGNOSIS — I788 Other diseases of capillaries: Secondary | ICD-10-CM | POA: Diagnosis not present

## 2016-11-19 DIAGNOSIS — D2271 Melanocytic nevi of right lower limb, including hip: Secondary | ICD-10-CM | POA: Diagnosis not present

## 2016-11-29 DIAGNOSIS — M0579 Rheumatoid arthritis with rheumatoid factor of multiple sites without organ or systems involvement: Secondary | ICD-10-CM | POA: Diagnosis not present

## 2016-11-29 DIAGNOSIS — M25461 Effusion, right knee: Secondary | ICD-10-CM | POA: Diagnosis not present

## 2016-11-29 DIAGNOSIS — M150 Primary generalized (osteo)arthritis: Secondary | ICD-10-CM | POA: Diagnosis not present

## 2016-11-29 DIAGNOSIS — Z6821 Body mass index (BMI) 21.0-21.9, adult: Secondary | ICD-10-CM | POA: Diagnosis not present

## 2016-11-29 DIAGNOSIS — M5136 Other intervertebral disc degeneration, lumbar region: Secondary | ICD-10-CM | POA: Diagnosis not present

## 2016-11-29 DIAGNOSIS — M810 Age-related osteoporosis without current pathological fracture: Secondary | ICD-10-CM | POA: Diagnosis not present

## 2016-12-11 DIAGNOSIS — M0579 Rheumatoid arthritis with rheumatoid factor of multiple sites without organ or systems involvement: Secondary | ICD-10-CM | POA: Diagnosis not present

## 2017-01-08 DIAGNOSIS — M0579 Rheumatoid arthritis with rheumatoid factor of multiple sites without organ or systems involvement: Secondary | ICD-10-CM | POA: Diagnosis not present

## 2017-01-21 DIAGNOSIS — Z23 Encounter for immunization: Secondary | ICD-10-CM | POA: Diagnosis not present

## 2017-02-05 DIAGNOSIS — M0579 Rheumatoid arthritis with rheumatoid factor of multiple sites without organ or systems involvement: Secondary | ICD-10-CM | POA: Diagnosis not present

## 2017-02-13 DIAGNOSIS — I951 Orthostatic hypotension: Secondary | ICD-10-CM | POA: Diagnosis not present

## 2017-02-13 DIAGNOSIS — R030 Elevated blood-pressure reading, without diagnosis of hypertension: Secondary | ICD-10-CM | POA: Diagnosis not present

## 2017-02-19 DIAGNOSIS — Z9849 Cataract extraction status, unspecified eye: Secondary | ICD-10-CM | POA: Diagnosis not present

## 2017-02-19 DIAGNOSIS — H11153 Pinguecula, bilateral: Secondary | ICD-10-CM | POA: Diagnosis not present

## 2017-02-19 DIAGNOSIS — H18413 Arcus senilis, bilateral: Secondary | ICD-10-CM | POA: Diagnosis not present

## 2017-02-19 DIAGNOSIS — H04123 Dry eye syndrome of bilateral lacrimal glands: Secondary | ICD-10-CM | POA: Diagnosis not present

## 2017-02-19 DIAGNOSIS — H40053 Ocular hypertension, bilateral: Secondary | ICD-10-CM | POA: Diagnosis not present

## 2017-02-19 DIAGNOSIS — H40023 Open angle with borderline findings, high risk, bilateral: Secondary | ICD-10-CM | POA: Diagnosis not present

## 2017-02-19 DIAGNOSIS — H11423 Conjunctival edema, bilateral: Secondary | ICD-10-CM | POA: Diagnosis not present

## 2017-02-19 DIAGNOSIS — H40003 Preglaucoma, unspecified, bilateral: Secondary | ICD-10-CM | POA: Diagnosis not present

## 2017-02-19 DIAGNOSIS — Z961 Presence of intraocular lens: Secondary | ICD-10-CM | POA: Diagnosis not present

## 2017-02-25 DIAGNOSIS — B349 Viral infection, unspecified: Secondary | ICD-10-CM | POA: Diagnosis not present

## 2017-03-05 DIAGNOSIS — M0579 Rheumatoid arthritis with rheumatoid factor of multiple sites without organ or systems involvement: Secondary | ICD-10-CM | POA: Diagnosis not present

## 2017-03-11 DIAGNOSIS — J029 Acute pharyngitis, unspecified: Secondary | ICD-10-CM | POA: Diagnosis not present

## 2017-03-11 DIAGNOSIS — J300 Vasomotor rhinitis: Secondary | ICD-10-CM | POA: Diagnosis not present

## 2017-03-31 DIAGNOSIS — J310 Chronic rhinitis: Secondary | ICD-10-CM | POA: Diagnosis not present

## 2017-03-31 DIAGNOSIS — K219 Gastro-esophageal reflux disease without esophagitis: Secondary | ICD-10-CM | POA: Diagnosis not present

## 2017-04-01 DIAGNOSIS — M810 Age-related osteoporosis without current pathological fracture: Secondary | ICD-10-CM | POA: Diagnosis not present

## 2017-04-01 DIAGNOSIS — M25461 Effusion, right knee: Secondary | ICD-10-CM | POA: Diagnosis not present

## 2017-04-01 DIAGNOSIS — M0579 Rheumatoid arthritis with rheumatoid factor of multiple sites without organ or systems involvement: Secondary | ICD-10-CM | POA: Diagnosis not present

## 2017-04-01 DIAGNOSIS — Z6821 Body mass index (BMI) 21.0-21.9, adult: Secondary | ICD-10-CM | POA: Diagnosis not present

## 2017-04-01 DIAGNOSIS — M5136 Other intervertebral disc degeneration, lumbar region: Secondary | ICD-10-CM | POA: Diagnosis not present

## 2017-04-01 DIAGNOSIS — M150 Primary generalized (osteo)arthritis: Secondary | ICD-10-CM | POA: Diagnosis not present

## 2017-04-09 DIAGNOSIS — M0579 Rheumatoid arthritis with rheumatoid factor of multiple sites without organ or systems involvement: Secondary | ICD-10-CM | POA: Diagnosis not present

## 2017-04-24 DIAGNOSIS — K210 Gastro-esophageal reflux disease with esophagitis: Secondary | ICD-10-CM | POA: Diagnosis not present

## 2017-04-29 DIAGNOSIS — B338 Other specified viral diseases: Secondary | ICD-10-CM | POA: Diagnosis not present

## 2017-05-07 NOTE — Progress Notes (Signed)
Cardiology Office Note   Date:  05/13/2017   ID:  Elizabeth Estrada, DOB 04-Nov-1940, MRN [id number]  PCP:  [name], MD  Cardiologist:   Peter Martinique, MD   Chief Complaint  Patient presents with  . Palpitations      History of Present Illness: Elizabeth Estrada is a 77 y.o. female who is seen at the request of Dr. Marisue Humble for evaluation of palpitations with family history of CAD.  She was last seen in 2013. She has a history of MVP and family history of CAD. She has a history of murmur. Echo in 2006 mentions mild prolapse but in 2013 she had an Echo showing a small pericardial effusion otherwise normal. No prolapse seen. She also had a normal stress Echo at that time.  She was concerned over minor symptoms of fluttering. She states this is rare and only lasts a few seconds. No clear triggers. No dizziness, syncope, chest pain, SOB. Generally active.     Past Medical History:  Diagnosis Date  . Anxiety   . Chronic depression   . Heart murmur    NO PROBLEMS AS ADULT  . Kidney stone   . Melanoma ([place])    removed from back  . MVP (mitral valve prolapse)   . Osteoporosis   . Rheumatoid arthritis(714.0)    HANDS. KNEES  . Rib fracture     Past Surgical History:  Procedure Laterality Date  . BREAST SURGERY     LEFT BREAST BX- BENIGN  . CARDIOVASCULAR STRESS TEST  10/19/1999, 2013   EF 97%, NO EVIDENCE OF ISCHEMIA, 2013 NORMAL  . COLONOSCOPY    . DILATATION & CURRETTAGE/HYSTEROSCOPY WITH RESECTOCOPE N/A 07/06/2013   Procedure: DILATATION & CURETTAGE/HYSTEROSCOPY WITH RESECTOCOPE with removal of endometrial lesion;  Surgeon: [name], MD;  Location: [facility];  Service: Gynecology;  Laterality: N/A;  . EYE SURGERY     BILATERAL CATARACT  . melanoma removed     from back  . REMOVAL OF CYST FROM HER LEFT BREAST Left 8/05   benign - Dr. Margot Chimes  . US ECHOCARDIOGRAPHY  06/04/2004   EF 60-65%     Current Outpatient Medications  Medication Sig Dispense Refill  .  aspirin 81 MG tablet Take 81 mg by mouth every other day.     Marland Kitchen CALCIUM PO Take by mouth daily.    . cholecalciferol (VITAMIN D) 1000 UNITS tablet Take 1,000 Units by mouth daily.    Marland Kitchen escitalopram (LEXAPRO) 10 MG tablet Take 10 mg by mouth daily.    . Multiple Vitamin (MULTIVITAMIN) tablet Take 1 tablet by mouth daily.    Marland Kitchen oxyCODONE-acetaminophen (PERCOCET/ROXICET) 5-325 MG tablet Take 1-2 tablets by mouth every 4 (four) hours as needed for severe pain. 12 tablet 0  . PRESCRIPTION MEDICATION 1 each as directed.    . sodium chloride (OCEAN) 0.65 % SOLN nasal spray Place 1 spray into both nostrils as needed for congestion.     No current facility-administered medications for this visit.     Allergies:   Methotrexate derivatives; Plaquenil [hydroxychloroquine sulfate]; Remicade [infliximab]; Ridaura [auranofin]; and Sudafed [pseudoephedrine]    Social History:  The patient  reports that  has never smoked. she has never used smokeless tobacco. She reports that she does not drink alcohol or use drugs.   Family History:  The patient's family history includes Coronary artery disease in her mother; Heart disease in her father; Hypertension in her father; Pancreatic cancer in her father.  ROS:  Please see the history of present illness.  Some recent sinus drainage and cough. Otherwise, review of systems are positive for none.   All other systems are reviewed and negative.    PHYSICAL EXAM: VS:  BP 118/70 (BP Location: Right Leg)   Pulse 60   Ht 5\' 2"  (1.575 m)   Wt 123 lb (55.8 kg)   LMP 04/15/1990   BMI 22.50 kg/m  , BMI Body mass index is 22.5 kg/m. GEN: Well nourished, well developed, in no acute distress  HEENT: normal  Neck: no JVD, carotid bruits, or masses Cardiac: RRR; gr 2/6 systolic murmur LUSB,  rubs, or gallops,no edema. Normal S1-2.  Respiratory:  clear to auscultation bilaterally, normal work of breathing GI: soft, nontender, nondistended, + BS MS: no deformity or  atrophy  Skin: warm and dry, no rash Neuro:  Strength and sensation are intact Psych: euthymic mood, full affect   EKG:  EKG is ordered today. The ekg ordered today demonstrates NSR with normal Ecg. I have personally reviewed and interpreted this study.    Recent Labs: Lab Results  Component Value Date   WBC 6.6 11/01/2015   HGB 13.0 11/01/2015   HCT 40.5 11/01/2015   PLT 165 11/01/2015   GLUCOSE 108 (H) 11/01/2015   ALT 19 11/01/2015   AST 25 11/01/2015   NA 141 11/01/2015   K 4.1 11/01/2015   CL 110 11/01/2015   CREATININE 1.05 (H) 11/01/2015   BUN 30 (H) 11/01/2015   CO2 26 11/01/2015     Lipid Panel No results found for: CHOL, TRIG, HDL, CHOLHDL, VLDL, LDLCALC, LDLDIRECT    Wt Readings from Last 3 Encounters:  05/13/17 123 lb (55.8 kg)  11/01/15 119 lb (54 kg)  03/17/14 119 lb (54 kg)      Other studies Reviewed: Additional studies/ records that were reviewed today include: none   ASSESSMENT AND PLAN:  1.  Palpitations. These are quite minor and infrequent. Reassured her that I don't feel these are serious. Recommend avoidance of caffeine. Otherwise no work up needed. 2. Murmur. Based on exam this is probably an innocent flow murmur. ? Mild MV prolapse in the past. Will observe.    Current medicines are reviewed at length with the patient today.  The patient does not have concerns regarding medicines.  The following changes have been made:  no change  Labs/ tests ordered today include:  No orders of the defined types were placed in this encounter.    Disposition:   FU with me PRN.  Signed, Peter Martinique, MD  05/13/2017 9:08 AM    [facility] [address], [place], [place], [address] Phone [phone], Fax [phone]

## 2017-05-13 ENCOUNTER — Ambulatory Visit (INDEPENDENT_AMBULATORY_CARE_PROVIDER_SITE_OTHER): Payer: Medicare Other | Admitting: Cardiology

## 2017-05-13 ENCOUNTER — Encounter: Payer: Self-pay | Admitting: Cardiology

## 2017-05-13 VITALS — BP 118/70 | HR 60 | Ht 62.0 in | Wt 123.0 lb

## 2017-05-13 DIAGNOSIS — I341 Nonrheumatic mitral (valve) prolapse: Secondary | ICD-10-CM | POA: Diagnosis not present

## 2017-05-13 DIAGNOSIS — R002 Palpitations: Secondary | ICD-10-CM | POA: Diagnosis not present

## 2017-05-20 DIAGNOSIS — M0579 Rheumatoid arthritis with rheumatoid factor of multiple sites without organ or systems involvement: Secondary | ICD-10-CM | POA: Diagnosis not present

## 2017-06-02 ENCOUNTER — Other Ambulatory Visit: Payer: Self-pay | Admitting: Physician Assistant

## 2017-06-02 DIAGNOSIS — M810 Age-related osteoporosis without current pathological fracture: Secondary | ICD-10-CM

## 2017-06-10 ENCOUNTER — Inpatient Hospital Stay
Admission: RE | Admit: 2017-06-10 | Discharge: 2017-06-10 | Disposition: A | Discharge status: Home / Self Care | Payer: Medicare Other | Source: Ambulatory Visit | Attending: Physician Assistant | Admitting: Physician Assistant

## 2017-06-17 ENCOUNTER — Inpatient Hospital Stay
Admission: RE | Admit: 2017-06-17 | Discharge: 2017-06-17 | Disposition: A | Discharge status: Home / Self Care | Payer: Medicare Other | Source: Ambulatory Visit | Attending: Physician Assistant | Admitting: Physician Assistant

## 2017-06-17 DIAGNOSIS — M0579 Rheumatoid arthritis with rheumatoid factor of multiple sites without organ or systems involvement: Secondary | ICD-10-CM | POA: Diagnosis not present

## 2017-06-23 ENCOUNTER — Ambulatory Visit
Admission: RE | Admit: 2017-06-23 | Discharge: 2017-06-23 | Disposition: A | Discharge status: Home / Self Care | Payer: Medicare Other | Source: Ambulatory Visit | Attending: Physician Assistant | Admitting: Physician Assistant

## 2017-06-23 DIAGNOSIS — M810 Age-related osteoporosis without current pathological fracture: Secondary | ICD-10-CM

## 2017-06-23 DIAGNOSIS — M8589 Other specified disorders of bone density and structure, multiple sites: Secondary | ICD-10-CM | POA: Diagnosis not present

## 2017-06-23 DIAGNOSIS — Z780 Asymptomatic menopausal state: Secondary | ICD-10-CM | POA: Diagnosis not present

## 2017-06-25 DIAGNOSIS — H16143 Punctate keratitis, bilateral: Secondary | ICD-10-CM | POA: Diagnosis not present

## 2017-06-25 DIAGNOSIS — Z961 Presence of intraocular lens: Secondary | ICD-10-CM | POA: Diagnosis not present

## 2017-06-25 DIAGNOSIS — H18413 Arcus senilis, bilateral: Secondary | ICD-10-CM | POA: Diagnosis not present

## 2017-06-25 DIAGNOSIS — Z9849 Cataract extraction status, unspecified eye: Secondary | ICD-10-CM | POA: Diagnosis not present

## 2017-06-25 DIAGNOSIS — H04123 Dry eye syndrome of bilateral lacrimal glands: Secondary | ICD-10-CM | POA: Diagnosis not present

## 2017-07-07 ENCOUNTER — Telehealth: Payer: Self-pay | Admitting: *Deleted

## 2017-07-07 NOTE — Telephone Encounter (Signed)
Call placed to the patient at the request of the patient's brother in law. The patient stated that she has had increased shortness of breath since February. This occurs mainly on exertion. She denies edema and chest pain. An appointment was made for 4/1 with a PA. She will call back if the shortness of breath worsens.

## 2017-07-14 ENCOUNTER — Other Ambulatory Visit: Payer: Self-pay | Admitting: Cardiology

## 2017-07-14 ENCOUNTER — Encounter: Payer: Self-pay | Admitting: Cardiology

## 2017-07-14 ENCOUNTER — Ambulatory Visit (INDEPENDENT_AMBULATORY_CARE_PROVIDER_SITE_OTHER): Payer: Medicare Other | Admitting: Cardiology

## 2017-07-14 VITALS — BP 144/66 | HR 61 | Ht 62.5 in | Wt 124.0 lb

## 2017-07-14 DIAGNOSIS — R0602 Shortness of breath: Secondary | ICD-10-CM | POA: Insufficient documentation

## 2017-07-14 DIAGNOSIS — I341 Nonrheumatic mitral (valve) prolapse: Secondary | ICD-10-CM

## 2017-07-14 DIAGNOSIS — R0789 Other chest pain: Secondary | ICD-10-CM | POA: Diagnosis not present

## 2017-07-14 DIAGNOSIS — F329 Major depressive disorder, single episode, unspecified: Secondary | ICD-10-CM

## 2017-07-14 DIAGNOSIS — R0609 Other forms of dyspnea: Secondary | ICD-10-CM

## 2017-07-14 DIAGNOSIS — I471 Supraventricular tachycardia: Secondary | ICD-10-CM | POA: Diagnosis not present

## 2017-07-14 DIAGNOSIS — Z8249 Family history of ischemic heart disease and other diseases of the circulatory system: Secondary | ICD-10-CM

## 2017-07-14 DIAGNOSIS — R0600 Dyspnea, unspecified: Secondary | ICD-10-CM

## 2017-07-14 DIAGNOSIS — Z13220 Encounter for screening for lipoid disorders: Secondary | ICD-10-CM

## 2017-07-14 DIAGNOSIS — R002 Palpitations: Secondary | ICD-10-CM

## 2017-07-14 DIAGNOSIS — F32A Depression, unspecified: Secondary | ICD-10-CM

## 2017-07-14 NOTE — Progress Notes (Signed)
07/14/2017 Elizabeth Estrada   10/29/1940  [id number]  Primary Physician [name], MD Primary Cardiologist: Dr Martinique  HPI:  77 y/o female with a history of MVP. She had a negative stress echo in 2013. She saw Dr Martinique in jan 2019 for palpitations. After evaluation he reassured her he felt she was OK and recommend caffeine avoidence. She is in the office today with her adopted daughter (an Therapist, sports) and her sister. They tell me the pt has had new episodes of dyspnea. The first was while they were at the [place] and were walking upstairs. She tells me she became rather suddenly SOB and had vague "tightness". She had to rest for a few moments. No associated tachycardia, nausea, or diaphoresis. Since then she has noted unusual for her dyspnea when walking uphill.    Current Outpatient Medications  Medication Sig Dispense Refill  . Abatacept (ORENCIA IV) Inject into the vein every 30 (thirty) days.    Marland Kitchen aspirin 81 MG tablet Take 81 mg by mouth every other day.     Marland Kitchen CALCIUM PO Take by mouth daily.    . cholecalciferol (VITAMIN D) 1000 UNITS tablet Take 1,000 Units by mouth daily.    Marland Kitchen escitalopram (LEXAPRO) 10 MG tablet Take 10 mg by mouth daily.    . Multiple Vitamin (MULTIVITAMIN) tablet Take 1 tablet by mouth daily.    Marland Kitchen PRESCRIPTION MEDICATION 1 each as directed.    . sodium chloride (OCEAN) 0.65 % SOLN nasal spray Place 1 spray into both nostrils as needed for congestion.     No current facility-administered medications for this visit.     Allergies  Allergen Reactions  . Methotrexate Derivatives Itching and Swelling  . Plaquenil [Hydroxychloroquine Sulfate] Other (See Comments)    Unknown reaction   . Remicade [Infliximab] Other (See Comments)    Unknown reaction  . Ridaura [Auranofin] Other (See Comments)    Does not remember  . Sudafed [Pseudoephedrine]     nervous    Past Medical History:  Diagnosis Date  . Anxiety   . Chronic depression   . Heart murmur    NO  PROBLEMS AS ADULT  . Kidney stone   . Melanoma ([place])    removed from back  . MVP (mitral valve prolapse)   . Osteoporosis   . Rheumatoid arthritis(714.0)    HANDS. KNEES  . Rib fracture     Social History   Socioeconomic History  . Marital status: Married    Spouse name: Not on file  . Number of children: 0  . Years of education: Not on file  . Highest education level: Not on file  Occupational History  . Not on file  Social Needs  . Financial resource strain: Not on file  . Food insecurity:    Worry: Not on file    Inability: Not on file  . Transportation needs:    Medical: Not on file    Non-medical: Not on file  Tobacco Use  . Smoking status: Never Smoker  . Smokeless tobacco: Never Used  Substance and Sexual Activity  . Alcohol use: No  . Drug use: No  . Sexual activity: Never    Partners: Male    Birth control/protection: Post-menopausal  Lifestyle  . Physical activity:    Days per week: Not on file    Minutes per session: Not on file  . Stress: Not on file  Relationships  . Social connections:    Talks on phone: Not on  file    Gets together: Not on file    Attends religious service: Not on file    Active member of club or organization: Not on file    Attends meetings of clubs or organizations: Not on file    Relationship status: Not on file  . Intimate partner violence:    Fear of current or ex partner: Not on file    Emotionally abused: Not on file    Physically abused: Not on file    Forced sexual activity: Not on file  Other Topics Concern  . Not on file  Social History Narrative  . Not on file     Family History  Problem Relation Age of Onset  . Coronary artery disease Mother   . Pancreatic cancer Father   . Heart disease Father        bypass  . Hypertension Father   . CAD Brother 85       CABG and AVR     Review of Systems: General: negative for chills, fever, night sweats or weight changes.  Cardiovascular: negative for edema,  orthopnea, palpitations, paroxysmal nocturnal dyspnea  Dermatological: negative for rash Respiratory: negative for cough or wheezing Urologic: negative for hematuria Abdominal: negative for nausea, vomiting, diarrhea, bright red blood per rectum, melena, or hematemesis Neurologic: negative for visual changes, syncope, or dizziness All other systems reviewed and are otherwise negative except as noted above.    Blood pressure (!) 144/66, pulse 61, height 5' 2.5" (1.588 m), weight 124 lb (56.2 kg), last menstrual period 04/15/1990.  General appearance: alert, cooperative and no distress Neck: no carotid bruit and no JVD Lungs: clear to auscultation bilaterally Heart: regular rate and rhythm and on exam she had a brief, asymptomatic, run of tachycardia- 8-10 beats.  Extremities: extremities normal, atraumatic, no cyanosis or edema Skin: Skin color, texture, turgor normal. No rashes or lesions Neurologic: Grossly normal  EKG NSR, poor anterior RW (old)  ASSESSMENT AND PLAN:   DOE (dyspnea on exertion) Pt seen today for evaluation of new DOE  PSVT (paroxysmal supraventricular tachycardia) (HCC) Pt had a brief (10 beat) run of PSVT on exam today- r/o significant arrythmia  MVP (mitral valve prolapse) Past h/o MVP- negative stress echo in 2013  Depression Pt is "under a lot of stress" per family, her husband has been diagnosed with cancer.   Family history of coronary artery disease in brother Her brother had AVR and CABG in his 32's  Chest tightness Episode of exertional dyspnea and chest tighness walking upstairs- r/o CAD   PLAN  I suggested we proceed with stress echo and two week event monitor. She has not had labs drawn in > 6 months so I ordered a CBC, BMP, TSH. F/U based on these test.   [name]-C 07/14/2017 1:17 PM

## 2017-07-14 NOTE — Addendum Note (Signed)
Addended by: Newt Minion on: 07/14/2017 02:00 PM   Modules accepted: Orders

## 2017-07-14 NOTE — Assessment & Plan Note (Signed)
Episode of exertional dyspnea and chest tighness walking upstairs- r/o CAD

## 2017-07-14 NOTE — Assessment & Plan Note (Signed)
Her brother had AVR and CABG in his 74's

## 2017-07-14 NOTE — Assessment & Plan Note (Signed)
Past h/o MVP- negative stress echo in 2013

## 2017-07-14 NOTE — H&P (View-Only) (Signed)
07/14/2017 Eutha Cude   11-02-1940  [id number]  Primary Physician [name], MD Primary Cardiologist: Dr Martinique  HPI:  77 y/o female with a history of MVP. She had a negative stress echo in 2013. She saw Dr Martinique in jan 2019 for palpitations. After evaluation he reassured her he felt she was OK and recommend caffeine avoidence. She is in the office today with her adopted daughter (an Therapist, sports) and her sister. They tell me the pt has had new episodes of dyspnea. The first was while they were at the [place] and were walking upstairs. She tells me she became rather suddenly SOB and had vague "tightness". She had to rest for a few moments. No associated tachycardia, nausea, or diaphoresis. Since then she has noted unusual for her dyspnea when walking uphill.    Current Outpatient Medications  Medication Sig Dispense Refill  . Abatacept (ORENCIA IV) Inject into the vein every 30 (thirty) days.    Marland Kitchen aspirin 81 MG tablet Take 81 mg by mouth every other day.     Marland Kitchen CALCIUM PO Take by mouth daily.    . cholecalciferol (VITAMIN D) 1000 UNITS tablet Take 1,000 Units by mouth daily.    Marland Kitchen escitalopram (LEXAPRO) 10 MG tablet Take 10 mg by mouth daily.    . Multiple Vitamin (MULTIVITAMIN) tablet Take 1 tablet by mouth daily.    Marland Kitchen PRESCRIPTION MEDICATION 1 each as directed.    . sodium chloride (OCEAN) 0.65 % SOLN nasal spray Place 1 spray into both nostrils as needed for congestion.     No current facility-administered medications for this visit.     Allergies  Allergen Reactions  . Methotrexate Derivatives Itching and Swelling  . Plaquenil [Hydroxychloroquine Sulfate] Other (See Comments)    Unknown reaction   . Remicade [Infliximab] Other (See Comments)    Unknown reaction  . Ridaura [Auranofin] Other (See Comments)    Does not remember  . Sudafed [Pseudoephedrine]     nervous    Past Medical History:  Diagnosis Date  . Anxiety   . Chronic depression   . Heart murmur    NO  PROBLEMS AS ADULT  . Kidney stone   . Melanoma ([place])    removed from back  . MVP (mitral valve prolapse)   . Osteoporosis   . Rheumatoid arthritis(714.0)    HANDS. KNEES  . Rib fracture     Social History   Socioeconomic History  . Marital status: Married    Spouse name: Not on file  . Number of children: 0  . Years of education: Not on file  . Highest education level: Not on file  Occupational History  . Not on file  Social Needs  . Financial resource strain: Not on file  . Food insecurity:    Worry: Not on file    Inability: Not on file  . Transportation needs:    Medical: Not on file    Non-medical: Not on file  Tobacco Use  . Smoking status: Never Smoker  . Smokeless tobacco: Never Used  Substance and Sexual Activity  . Alcohol use: No  . Drug use: No  . Sexual activity: Never    Partners: Male    Birth control/protection: Post-menopausal  Lifestyle  . Physical activity:    Days per week: Not on file    Minutes per session: Not on file  . Stress: Not on file  Relationships  . Social connections:    Talks on phone: Not on  file    Gets together: Not on file    Attends religious service: Not on file    Active member of club or organization: Not on file    Attends meetings of clubs or organizations: Not on file    Relationship status: Not on file  . Intimate partner violence:    Fear of current or ex partner: Not on file    Emotionally abused: Not on file    Physically abused: Not on file    Forced sexual activity: Not on file  Other Topics Concern  . Not on file  Social History Narrative  . Not on file     Family History  Problem Relation Age of Onset  . Coronary artery disease Mother   . Pancreatic cancer Father   . Heart disease Father        bypass  . Hypertension Father   . CAD Brother 46       CABG and AVR     Review of Systems: General: negative for chills, fever, night sweats or weight changes.  Cardiovascular: negative for edema,  orthopnea, palpitations, paroxysmal nocturnal dyspnea  Dermatological: negative for rash Respiratory: negative for cough or wheezing Urologic: negative for hematuria Abdominal: negative for nausea, vomiting, diarrhea, bright red blood per rectum, melena, or hematemesis Neurologic: negative for visual changes, syncope, or dizziness All other systems reviewed and are otherwise negative except as noted above.    Blood pressure (!) 144/66, pulse 61, height 5' 2.5" (1.588 m), weight 124 lb (56.2 kg), last menstrual period 04/15/1990.  General appearance: alert, cooperative and no distress Neck: no carotid bruit and no JVD Lungs: clear to auscultation bilaterally Heart: regular rate and rhythm and on exam she had a brief, asymptomatic, run of tachycardia- 8-10 beats.  Extremities: extremities normal, atraumatic, no cyanosis or edema Skin: Skin color, texture, turgor normal. No rashes or lesions Neurologic: Grossly normal  EKG NSR, poor anterior RW (old)  ASSESSMENT AND PLAN:   DOE (dyspnea on exertion) Pt seen today for evaluation of new DOE  PSVT (paroxysmal supraventricular tachycardia) (HCC) Pt had a brief (10 beat) run of PSVT on exam today- r/o significant arrythmia  MVP (mitral valve prolapse) Past h/o MVP- negative stress echo in 2013  Depression Pt is "under a lot of stress" per family, her husband has been diagnosed with cancer.   Family history of coronary artery disease in brother Her brother had AVR and CABG in his 83's  Chest tightness Episode of exertional dyspnea and chest tighness walking upstairs- r/o CAD   PLAN  I suggested we proceed with stress echo and two week event monitor. She has not had labs drawn in > 6 months so I ordered a CBC, BMP, TSH. F/U based on these test.   [name]-C 07/14/2017 1:17 PM

## 2017-07-14 NOTE — Assessment & Plan Note (Addendum)
Pt is "under a lot of stress" per family, her husband has been diagnosed with cancer.

## 2017-07-14 NOTE — Patient Instructions (Signed)
Medication Instructions: Your physician recommends that you continue on your current medications as directed. Please refer to the Current Medication list given to you today.  Labwork: Your physician recommends that you return for lab work today: CBC, BMET, TSH   Testing/Procedures: Your physician has requested that you have a stress echocardiogram. For further information please visit [email or web address]. Please follow instruction sheet as given.  Your physician has recommended that you wear a 14 day event monitor. Event monitors are medical devices that record the heart's electrical activity. Doctors most often [place] these monitors to diagnose arrhythmias. Arrhythmias are problems with the speed or rhythm of the heartbeat. The monitor is a small, portable device. You can wear one while you do your normal daily activities. This is usually used to diagnose what is causing palpitations/syncope (passing out).  Follow-Up: Your physician recommends that you schedule a follow-up appointment as needed.

## 2017-07-14 NOTE — Assessment & Plan Note (Signed)
Pt seen today for evaluation of new DOE

## 2017-07-14 NOTE — Assessment & Plan Note (Signed)
Pt had a brief (10 beat) run of PSVT on exam today- r/o significant arrythmia

## 2017-07-15 LAB — BASIC METABOLIC PANEL
BUN/Creatinine Ratio: 28 (ref 12–28)
BUN: 21 mg/dL (ref 8–27)
CO2: 22 mmol/L (ref 20–29)
Calcium: 9.2 mg/dL (ref 8.7–10.3)
Chloride: 105 mmol/L (ref 96–106)
Creatinine, Ser: 0.75 mg/dL (ref 0.57–1.00)
GFR calc Af Amer: 90 mL/min/{1.73_m2} (ref >59)
GFR calc non Af Amer: 78 mL/min/{1.73_m2} (ref >59)
Glucose: 85 mg/dL (ref 65–99)
Potassium: 4.7 mmol/L (ref 3.5–5.2)
Sodium: 144 mmol/L (ref 134–144)

## 2017-07-15 LAB — CBC
Hematocrit: 41.8 % (ref 34.0–46.6)
Hemoglobin: 13.5 g/dL (ref 11.1–15.9)
MCH: 28.2 pg (ref 26.6–33.0)
MCHC: 32.3 g/dL (ref 31.5–35.7)
MCV: 87 fL (ref 79–97)
Platelets: 184 x10E3/uL (ref 150–379)
RBC: 4.78 x10E6/uL (ref 3.77–5.28)
RDW: 14.5 % (ref 12.3–15.4)
WBC: 4.2 x10E3/uL (ref 3.4–10.8)

## 2017-07-15 LAB — TSH: TSH: 1.61 u[IU]/mL (ref 0.450–4.500)

## 2017-07-17 DIAGNOSIS — M0579 Rheumatoid arthritis with rheumatoid factor of multiple sites without organ or systems involvement: Secondary | ICD-10-CM | POA: Diagnosis not present

## 2017-07-22 DIAGNOSIS — R05 Cough: Secondary | ICD-10-CM | POA: Diagnosis not present

## 2017-07-22 DIAGNOSIS — J309 Allergic rhinitis, unspecified: Secondary | ICD-10-CM | POA: Diagnosis not present

## 2017-07-24 ENCOUNTER — Ambulatory Visit (INDEPENDENT_AMBULATORY_CARE_PROVIDER_SITE_OTHER): Payer: Medicare Other

## 2017-07-24 ENCOUNTER — Other Ambulatory Visit: Payer: Self-pay | Admitting: Cardiology

## 2017-07-24 ENCOUNTER — Telehealth (HOSPITAL_COMMUNITY): Payer: Self-pay | Admitting: *Deleted

## 2017-07-24 DIAGNOSIS — R0600 Dyspnea, unspecified: Secondary | ICD-10-CM

## 2017-07-24 DIAGNOSIS — R002 Palpitations: Secondary | ICD-10-CM | POA: Diagnosis not present

## 2017-07-24 DIAGNOSIS — I471 Supraventricular tachycardia: Secondary | ICD-10-CM

## 2017-07-24 DIAGNOSIS — R0609 Other forms of dyspnea: Secondary | ICD-10-CM | POA: Diagnosis not present

## 2017-07-24 NOTE — Telephone Encounter (Signed)
Left message on voicemail per DPR in reference to upcoming appointment scheduled on 07/31/17 at 2:30 with detailed instructions given per Stress Test Requisition Sheet for the test. LM to arrive 30 minutes early, and that it is imperative to arrive on time for appointment to keep from having the test rescheduled. If you need to cancel or reschedule your appointment, please call the office within 24 hours of your appointment. Failure to do so may result in a cancellation of your appointment, and a $50 no show fee. Phone number given for call back for any questions. Elizabeth Estrada

## 2017-07-29 LAB — LIPID PANEL
Chol/HDL Ratio: 2.5 ratio (ref 0.0–4.4)
Cholesterol, Total: 227 mg/dL — ABNORMAL HIGH (ref 100–199)
HDL: 91 mg/dL (ref >39)
LDL Calculated: 127 mg/dL — ABNORMAL HIGH (ref 0–99)
Triglycerides: 43 mg/dL (ref 0–149)
VLDL Cholesterol Cal: 9 mg/dL (ref 5–40)

## 2017-07-29 LAB — SPECIMEN STATUS REPORT

## 2017-07-31 ENCOUNTER — Encounter (HOSPITAL_COMMUNITY): Payer: Self-pay | Admitting: Radiology

## 2017-07-31 ENCOUNTER — Ambulatory Visit (HOSPITAL_COMMUNITY): Payer: Medicare Other

## 2017-07-31 ENCOUNTER — Other Ambulatory Visit: Payer: Self-pay

## 2017-07-31 ENCOUNTER — Other Ambulatory Visit: Payer: Self-pay | Admitting: Cardiology

## 2017-07-31 ENCOUNTER — Ambulatory Visit (HOSPITAL_COMMUNITY): Payer: Medicare Other | Attending: Cardiology

## 2017-07-31 DIAGNOSIS — R002 Palpitations: Secondary | ICD-10-CM

## 2017-07-31 DIAGNOSIS — I313 Pericardial effusion (noninflammatory): Secondary | ICD-10-CM | POA: Insufficient documentation

## 2017-07-31 DIAGNOSIS — M150 Primary generalized (osteo)arthritis: Secondary | ICD-10-CM | POA: Diagnosis not present

## 2017-07-31 DIAGNOSIS — M810 Age-related osteoporosis without current pathological fracture: Secondary | ICD-10-CM | POA: Diagnosis not present

## 2017-07-31 DIAGNOSIS — R0600 Dyspnea, unspecified: Secondary | ICD-10-CM

## 2017-07-31 DIAGNOSIS — F419 Anxiety disorder, unspecified: Secondary | ICD-10-CM | POA: Insufficient documentation

## 2017-07-31 DIAGNOSIS — M5136 Other intervertebral disc degeneration, lumbar region: Secondary | ICD-10-CM | POA: Diagnosis not present

## 2017-07-31 DIAGNOSIS — R0609 Other forms of dyspnea: Secondary | ICD-10-CM | POA: Diagnosis not present

## 2017-07-31 DIAGNOSIS — M25461 Effusion, right knee: Secondary | ICD-10-CM | POA: Diagnosis not present

## 2017-07-31 DIAGNOSIS — M0579 Rheumatoid arthritis with rheumatoid factor of multiple sites without organ or systems involvement: Secondary | ICD-10-CM | POA: Diagnosis not present

## 2017-07-31 DIAGNOSIS — Z6821 Body mass index (BMI) 21.0-21.9, adult: Secondary | ICD-10-CM | POA: Diagnosis not present

## 2017-07-31 NOTE — Progress Notes (Signed)
Stress echocardiogram was converted to complete transthoracic echocardiogram due to pericardial effusion found in preliminary images of the stress echocardiogram. Conferred with DOD, Dr. Angelena Form.

## 2017-08-01 ENCOUNTER — Telehealth: Payer: Self-pay | Admitting: Cardiology

## 2017-08-01 ENCOUNTER — Other Ambulatory Visit: Payer: Self-pay | Admitting: Cardiology

## 2017-08-01 DIAGNOSIS — R0789 Other chest pain: Secondary | ICD-10-CM

## 2017-08-01 NOTE — Telephone Encounter (Signed)
I reviewed her echo with Dr Martinique and spoke with the patient. Plan is to f/u with Dr Martinique in 1-2 months. Nothing acute seen on echo that would explain her symptoms of [place] PA-C 08/01/2017 8:30 AM

## 2017-08-01 NOTE — Telephone Encounter (Signed)
Pt asked that I speak to her sister about her echo.  [name]-C 08/01/2017 3:03 PM

## 2017-08-01 NOTE — Telephone Encounter (Signed)
Long discussion with Otila Kluver (daughter) and pt's sister. I did my best to explain the echo results. I explained that we will proceed with a treadmill next week. They wanted [place] to know that Ms Catania frequently minimizes her symptoms. They have been encouraging her to wear the monitor but she has not been 100% compliant with this. Further work when she sees Dr Martinique in May after the monitor is completed.   [name]-C 08/01/2017 3:54 PM

## 2017-08-01 NOTE — Telephone Encounter (Signed)
Called pt's sister- left message.  [name]-C 08/01/2017 3:06 PM

## 2017-08-01 NOTE — Telephone Encounter (Signed)
Tried to call her again and couldn't get through.  [name]-C 08/01/2017 3:24 PM

## 2017-08-01 NOTE — Telephone Encounter (Signed)
New Message   Pt verbalized that she wants to know why she only had the echo done and not the stress test she was scheduled for and also her results. Please call

## 2017-08-01 NOTE — Telephone Encounter (Signed)
At the pt's request I tried to call her daughter Otila Kluver- [phone]. Left message.  [name]-C 08/01/2017 3:21 PM

## 2017-08-01 NOTE — Telephone Encounter (Signed)
New Message   Patients sister is returning call in reference to echo results.

## 2017-08-05 ENCOUNTER — Ambulatory Visit (INDEPENDENT_AMBULATORY_CARE_PROVIDER_SITE_OTHER): Payer: Medicare Other

## 2017-08-05 DIAGNOSIS — R0789 Other chest pain: Secondary | ICD-10-CM

## 2017-08-05 LAB — EXERCISE TOLERANCE TEST
Estimated workload: 3.1 METS
Exercise duration (min): 1 min
Exercise duration (sec): 1 s
MPHR: 144 {beats}/min
Peak HR: 83 {beats}/min
Percent HR: 57 %
RPE: 14
Rest HR: 59 {beats}/min

## 2017-08-06 ENCOUNTER — Telehealth: Payer: Self-pay | Admitting: Cardiology

## 2017-08-06 NOTE — Telephone Encounter (Signed)
Called to discuss stress test- left message that I would try again tomorrow.  [name]-C 08/06/2017 5:03 PM

## 2017-08-07 ENCOUNTER — Telehealth: Payer: Self-pay | Admitting: Cardiology

## 2017-08-07 MED ORDER — ALPRAZOLAM 0.25 MG PO TABS: 0.2500 mg | ORAL_TABLET | ORAL | 0 refills | Status: DC | PRN

## 2017-08-07 NOTE — Telephone Encounter (Signed)
I spoke with Ms Decoste about Rt and Lt heart cath. The patient understands that risks included but are not limited to stroke (1 in 1000), death (1 in 31), kidney failure [usually temporary] (1 in 500), bleeding (1 in 200), allergic reaction [possibly serious] (1 in 200).  The patient understands and agrees to proceed. I also discussed this with her daughter Otila Kluver.  [name]-C 08/07/2017 8:15 AM

## 2017-08-07 NOTE — Telephone Encounter (Signed)
[place] Patient is currently with her sister.   Patient is very anxious about procedure - "ridicuously anxious". She would like to know if [place], [place] can call in a PRN anxiety medication for today and post-hospital  -- daughter would like a call back if Lurena Joiner will provide PRN medication   Daughter asked if it was OK for patient to have cath tomorrow since she has had a "viral cough". Saw PCP for this a few weeks ago. She is not currently on antibiotic and has no acute issues.   Also reviewed with daughter medication instructions pre-cath - she was concerned since patient generally takes ASA at night. Advised she should take today and should take prior to heart cath tomorrow.

## 2017-08-07 NOTE — Telephone Encounter (Signed)
Discussed with [place], [place]. Okay to fill Alprazolam 0.25 mg #3 tablets 0 refills. He recommended not to take any tomorrow morning.   Reviewed recommendations with Otila Kluver. Verbalized understanding and agreed with plan.  Rx called into patient's pharmacy.

## 2017-08-07 NOTE — Telephone Encounter (Signed)
Pt's daughter   Pt is having a cath tomorrow and has questions concerning pt's cath

## 2017-08-07 NOTE — Telephone Encounter (Signed)
Returned call to Wilson Singer, patient's daughter (okay per [facility]). Cath instructions reviewed, daughter verbalized understanding and read back instructions. Instructions also sent to mychart.  Daughter will communicate instructions to patient. She reports that patient seems overwhelmed with her health and she will explain everything to her. Advised daughter to call if she or patient has any questions.  Daughter verbalized understanding and agreed with plan.

## 2017-08-07 NOTE — Telephone Encounter (Signed)
You are scheduled for a Cardiac Catheterization on Friday, April 26 with Dr. Daneen Schick.  1. Please arrive at the [facility] (Main Entrance A) at [facility]: [place], [place] [address] at 10:00 AM (two hours before your procedure to ensure your preparation). Free valet parking service is available.   Special note: Every effort is made to have your procedure done on time. Please understand that emergencies sometimes delay scheduled procedures.  2. Diet: Do not eat or drink anything after midnight prior to your procedure except sips of water to take medications.  3. Labs: Your labs will be performed at the hospital after you arrive for your procedure.  4. Medication instructions in preparation for your procedure: On the morning of your procedure, take your Aspirin and any morning medicines NOT listed above.  You may use sips of water.  5. Plan for one night stay--bring personal belongings.  6. Bring a current list of your medications and current insurance cards.  7. You MUST have a responsible person to drive you home.  8. Someone MUST be with you the first 24 hours after you arrive home or your discharge will be delayed.  9. Please wear clothes that are easy to get on and off and wear slip-on shoes.  Thank you for allowing [place] to care for you!   -- [facility]

## 2017-08-08 ENCOUNTER — Encounter (HOSPITAL_COMMUNITY): Payer: Self-pay | Admitting: General Practice

## 2017-08-08 ENCOUNTER — Other Ambulatory Visit: Payer: Self-pay

## 2017-08-08 ENCOUNTER — Ambulatory Visit (HOSPITAL_COMMUNITY)
Admission: RE | Disposition: A | Discharge status: Home / Self Care | Payer: Self-pay | Source: Ambulatory Visit | Attending: Interventional Cardiology

## 2017-08-08 ENCOUNTER — Ambulatory Visit (HOSPITAL_COMMUNITY)
Admission: RE | Admit: 2017-08-08 | Discharge: 2017-08-12 | Disposition: A | Discharge status: Home / Self Care | Payer: Medicare Other | Source: Ambulatory Visit | Attending: Interventional Cardiology | Admitting: Interventional Cardiology

## 2017-08-08 DIAGNOSIS — R001 Bradycardia, unspecified: Secondary | ICD-10-CM | POA: Diagnosis not present

## 2017-08-08 DIAGNOSIS — I442 Atrioventricular block, complete: Secondary | ICD-10-CM | POA: Insufficient documentation

## 2017-08-08 DIAGNOSIS — I10 Essential (primary) hypertension: Secondary | ICD-10-CM | POA: Diagnosis not present

## 2017-08-08 DIAGNOSIS — M810 Age-related osteoporosis without current pathological fracture: Secondary | ICD-10-CM | POA: Diagnosis not present

## 2017-08-08 DIAGNOSIS — F339 Major depressive disorder, recurrent, unspecified: Secondary | ICD-10-CM | POA: Diagnosis not present

## 2017-08-08 DIAGNOSIS — R197 Diarrhea, unspecified: Secondary | ICD-10-CM | POA: Diagnosis not present

## 2017-08-08 DIAGNOSIS — I2510 Atherosclerotic heart disease of native coronary artery without angina pectoris: Secondary | ICD-10-CM

## 2017-08-08 DIAGNOSIS — R0602 Shortness of breath: Secondary | ICD-10-CM | POA: Diagnosis present

## 2017-08-08 DIAGNOSIS — R0609 Other forms of dyspnea: Secondary | ICD-10-CM | POA: Diagnosis not present

## 2017-08-08 DIAGNOSIS — I272 Pulmonary hypertension, unspecified: Secondary | ICD-10-CM | POA: Insufficient documentation

## 2017-08-08 DIAGNOSIS — I341 Nonrheumatic mitral (valve) prolapse: Secondary | ICD-10-CM | POA: Diagnosis not present

## 2017-08-08 DIAGNOSIS — M069 Rheumatoid arthritis, unspecified: Secondary | ICD-10-CM | POA: Diagnosis not present

## 2017-08-08 DIAGNOSIS — R0789 Other chest pain: Secondary | ICD-10-CM | POA: Diagnosis present

## 2017-08-08 DIAGNOSIS — Z7982 Long term (current) use of aspirin: Secondary | ICD-10-CM | POA: Diagnosis not present

## 2017-08-08 DIAGNOSIS — I050 Rheumatic mitral stenosis: Secondary | ICD-10-CM | POA: Diagnosis present

## 2017-08-08 DIAGNOSIS — R9439 Abnormal result of other cardiovascular function study: Secondary | ICD-10-CM | POA: Diagnosis present

## 2017-08-08 DIAGNOSIS — Z955 Presence of coronary angioplasty implant and graft: Secondary | ICD-10-CM

## 2017-08-08 DIAGNOSIS — I97630 Postprocedural hematoma of a circulatory system organ or structure following a cardiac catheterization: Secondary | ICD-10-CM | POA: Insufficient documentation

## 2017-08-08 DIAGNOSIS — I471 Supraventricular tachycardia, unspecified: Secondary | ICD-10-CM | POA: Diagnosis present

## 2017-08-08 DIAGNOSIS — F419 Anxiety disorder, unspecified: Secondary | ICD-10-CM | POA: Insufficient documentation

## 2017-08-08 DIAGNOSIS — I25118 Atherosclerotic heart disease of native coronary artery with other forms of angina pectoris: Secondary | ICD-10-CM | POA: Diagnosis not present

## 2017-08-08 DIAGNOSIS — Z8249 Family history of ischemic heart disease and other diseases of the circulatory system: Secondary | ICD-10-CM | POA: Diagnosis not present

## 2017-08-08 DIAGNOSIS — Z950 Presence of cardiac pacemaker: Secondary | ICD-10-CM

## 2017-08-08 DIAGNOSIS — Z9861 Coronary angioplasty status: Secondary | ICD-10-CM

## 2017-08-08 DIAGNOSIS — Z95818 Presence of other cardiac implants and grafts: Secondary | ICD-10-CM

## 2017-08-08 DIAGNOSIS — Y840 Cardiac catheterization as the cause of abnormal reaction of the patient, or of later complication, without mention of misadventure at the time of the procedure: Secondary | ICD-10-CM | POA: Diagnosis not present

## 2017-08-08 HISTORY — DX: Presence of cardiac pacemaker: Z95.0

## 2017-08-08 HISTORY — PX: RIGHT/LEFT HEART CATH AND CORONARY ANGIOGRAPHY: CATH118266

## 2017-08-08 HISTORY — DX: Essential (primary) hypertension: I10

## 2017-08-08 HISTORY — DX: Rheumatoid arthritis, unspecified: M06.9

## 2017-08-08 HISTORY — DX: Unspecified atrioventricular block: I44.30

## 2017-08-08 HISTORY — DX: Atherosclerotic heart disease of native coronary artery without angina pectoris: I25.10

## 2017-08-08 HISTORY — PX: CORONARY STENT INTERVENTION: CATH118234

## 2017-08-08 HISTORY — DX: Personal history of urinary calculi: Z87.442

## 2017-08-08 LAB — POCT I-STAT 3, ART BLOOD GAS (G3+)
Acid-base deficit: 3 mmol/L — ABNORMAL HIGH (ref 0.0–2.0)
Bicarbonate: 22.3 mmol/L (ref 20.0–28.0)
O2 Saturation: 93 %
TCO2: 24 mmol/L (ref 22–32)
pCO2 arterial: 42.1 mmHg (ref 32.0–48.0)
pH, Arterial: 7.333 — ABNORMAL LOW (ref 7.350–7.450)
pO2, Arterial: 71 mmHg — ABNORMAL LOW (ref 83.0–108.0)

## 2017-08-08 LAB — POCT I-STAT 3, VENOUS BLOOD GAS (G3P V)
Acid-base deficit: 5 mmol/L — ABNORMAL HIGH (ref 0.0–2.0)
Bicarbonate: 21.9 mmol/L (ref 20.0–28.0)
O2 Saturation: 64 %
TCO2: 23 mmol/L (ref 22–32)
pCO2, Ven: 45.8 mmHg (ref 44.0–60.0)
pH, Ven: 7.288 (ref 7.250–7.430)
pO2, Ven: 37 mmHg (ref 32.0–45.0)

## 2017-08-08 LAB — CBC
HCT: 39.4 % (ref 36.0–46.0)
Hemoglobin: 12.6 g/dL (ref 12.0–15.0)
MCH: 28.4 pg (ref 26.0–34.0)
MCHC: 32 g/dL (ref 30.0–36.0)
MCV: 88.9 fL (ref 78.0–100.0)
Platelets: 152 10*3/uL (ref 150–400)
RBC: 4.43 MIL/uL (ref 3.87–5.11)
RDW: 14 % (ref 11.5–15.5)
WBC: 4.9 10*3/uL (ref 4.0–10.5)

## 2017-08-08 LAB — BASIC METABOLIC PANEL
Anion gap: 7 (ref 5–15)
BUN: 23 mg/dL — ABNORMAL HIGH (ref 6–20)
CO2: 24 mmol/L (ref 22–32)
Calcium: 8.6 mg/dL — ABNORMAL LOW (ref 8.9–10.3)
Chloride: 109 mmol/L (ref 101–111)
Creatinine, Ser: 0.83 mg/dL (ref 0.44–1.00)
GFR calc Af Amer: >60 mL/min (ref >60)
GFR calc non Af Amer: >60 mL/min (ref >60)
Glucose, Bld: 83 mg/dL (ref 65–99)
Potassium: 4.3 mmol/L (ref 3.5–5.1)
Sodium: 140 mmol/L (ref 135–145)

## 2017-08-08 LAB — POCT ACTIVATED CLOTTING TIME
Activated Clotting Time: 334 seconds
Activated Clotting Time: 378 seconds

## 2017-08-08 SURGERY — RIGHT/LEFT HEART CATH AND CORONARY ANGIOGRAPHY
Anesthesia: LOCAL

## 2017-08-08 MED ORDER — GUAIFENESIN 100 MG/5ML PO SOLN
5.0000 mL | Freq: Once | ORAL | Status: AC
  Administered 2017-08-10: 100 mg via ORAL
  Filled 2017-08-08 (×2): qty 5

## 2017-08-08 MED ORDER — SODIUM CHLORIDE 0.9% FLUSH
3.0000 mL | Freq: Two times a day (BID) | INTRAVENOUS | Status: DC
  Administered 2017-08-08 – 2017-08-11 (×5): 3 mL via INTRAVENOUS

## 2017-08-08 MED ORDER — LIDOCAINE HCL (PF) 1 % IJ SOLN
INTRAMUSCULAR | Status: DC | PRN
  Administered 2017-08-08: 2 mL via SUBCUTANEOUS
  Administered 2017-08-08: 5 mL via SUBCUTANEOUS

## 2017-08-08 MED ORDER — HEPARIN (PORCINE) IN NACL 2-0.9 UNITS/ML
INTRAMUSCULAR | Status: AC | PRN
  Administered 2017-08-08 (×2): 500 mL

## 2017-08-08 MED ORDER — FENTANYL CITRATE (PF) 100 MCG/2ML IJ SOLN
INTRAMUSCULAR | Status: AC
  Filled 2017-08-08: qty 2

## 2017-08-08 MED ORDER — FENTANYL CITRATE (PF) 100 MCG/2ML IJ SOLN
INTRAMUSCULAR | Status: DC | PRN
  Administered 2017-08-08: 50 ug via INTRAVENOUS
  Administered 2017-08-08: 25 ug via INTRAVENOUS

## 2017-08-08 MED ORDER — IOHEXOL 350 MG/ML SOLN
INTRAVENOUS | Status: DC | PRN
  Administered 2017-08-08: 105 mL via INTRA_ARTERIAL

## 2017-08-08 MED ORDER — SODIUM CHLORIDE 0.9 % WEIGHT BASED INFUSION: 1.0000 mL/kg/h | INTRAVENOUS | Status: DC

## 2017-08-08 MED ORDER — MIDAZOLAM HCL 2 MG/2ML IJ SOLN
INTRAMUSCULAR | Status: AC
  Filled 2017-08-08: qty 2

## 2017-08-08 MED ORDER — CLOPIDOGREL BISULFATE 75 MG PO TABS
75.0000 mg | ORAL_TABLET | Freq: Every day | ORAL | Status: DC
  Administered 2017-08-09 – 2017-08-12 (×4): 75 mg via ORAL
  Filled 2017-08-08 (×4): qty 1

## 2017-08-08 MED ORDER — CLOPIDOGREL BISULFATE 300 MG PO TABS
ORAL_TABLET | ORAL | Status: AC
  Filled 2017-08-08: qty 2

## 2017-08-08 MED ORDER — HEPARIN SODIUM (PORCINE) 1000 UNIT/ML IJ SOLN
INTRAMUSCULAR | Status: AC
Start: 2017-08-08 — End: 2017-08-08
  Filled 2017-08-08: qty 1

## 2017-08-08 MED ORDER — ALPRAZOLAM 0.25 MG PO TABS
0.2500 mg | ORAL_TABLET | Freq: Every day | ORAL | Status: DC | PRN
  Administered 2017-08-11: 0.25 mg via ORAL
  Filled 2017-08-08 (×2): qty 1

## 2017-08-08 MED ORDER — SODIUM CHLORIDE 0.9 % IV SOLN: 250.0000 mL | INTRAVENOUS | Status: DC | PRN

## 2017-08-08 MED ORDER — HEPARIN (PORCINE) IN NACL 1000-0.9 UT/500ML-% IV SOLN
INTRAVENOUS | Status: AC
  Filled 2017-08-08: qty 1000

## 2017-08-08 MED ORDER — HYDRALAZINE HCL 20 MG/ML IJ SOLN: 5.0000 mg | INTRAMUSCULAR | Status: AC | PRN

## 2017-08-08 MED ORDER — LABETALOL HCL 5 MG/ML IV SOLN: 10.0000 mg | INTRAVENOUS | Status: AC | PRN

## 2017-08-08 MED ORDER — ASPIRIN 81 MG PO CHEW: 81.0000 mg | CHEWABLE_TABLET | ORAL | Status: DC

## 2017-08-08 MED ORDER — LORATADINE 10 MG PO TABS
10.0000 mg | ORAL_TABLET | Freq: Every day | ORAL | Status: DC | PRN
  Filled 2017-08-08: qty 1

## 2017-08-08 MED ORDER — VERAPAMIL HCL 2.5 MG/ML IV SOLN
INTRAVENOUS | Status: AC
  Filled 2017-08-08: qty 2

## 2017-08-08 MED ORDER — ESCITALOPRAM OXALATE 10 MG PO TABS
10.0000 mg | ORAL_TABLET | Freq: Every day | ORAL | Status: DC
  Administered 2017-08-08 – 2017-08-12 (×5): 10 mg via ORAL
  Filled 2017-08-08 (×5): qty 1

## 2017-08-08 MED ORDER — HEPARIN SODIUM (PORCINE) 1000 UNIT/ML IJ SOLN
INTRAMUSCULAR | Status: DC | PRN
  Administered 2017-08-08: 4000 [IU] via INTRAVENOUS
  Administered 2017-08-08: 6000 [IU] via INTRAVENOUS

## 2017-08-08 MED ORDER — VERAPAMIL HCL 2.5 MG/ML IV SOLN
INTRAVENOUS | Status: DC | PRN
  Administered 2017-08-08: 10 mL via INTRA_ARTERIAL

## 2017-08-08 MED ORDER — ACETAMINOPHEN 325 MG PO TABS: 650.0000 mg | ORAL_TABLET | ORAL | Status: DC | PRN

## 2017-08-08 MED ORDER — VITAMIN D 1000 UNITS PO TABS
1000.0000 [IU] | ORAL_TABLET | Freq: Every day | ORAL | Status: DC
  Administered 2017-08-09 – 2017-08-12 (×3): 1000 [IU] via ORAL
  Filled 2017-08-08 (×5): qty 1

## 2017-08-08 MED ORDER — CLOPIDOGREL BISULFATE 300 MG PO TABS
ORAL_TABLET | ORAL | Status: DC | PRN
  Administered 2017-08-08: 600 mg via ORAL

## 2017-08-08 MED ORDER — SODIUM CHLORIDE 0.9% FLUSH: 3.0000 mL | INTRAVENOUS | Status: DC | PRN

## 2017-08-08 MED ORDER — LIDOCAINE HCL (PF) 1 % IJ SOLN
INTRAMUSCULAR | Status: AC
  Filled 2017-08-08: qty 30

## 2017-08-08 MED ORDER — ASPIRIN 81 MG PO CHEW
81.0000 mg | CHEWABLE_TABLET | Freq: Every day | ORAL | Status: DC
  Administered 2017-08-09 – 2017-08-12 (×3): 81 mg via ORAL
  Filled 2017-08-08 (×4): qty 1

## 2017-08-08 MED ORDER — ATORVASTATIN CALCIUM 80 MG PO TABS
80.0000 mg | ORAL_TABLET | Freq: Every day | ORAL | Status: DC
  Administered 2017-08-08 – 2017-08-11 (×4): 80 mg via ORAL
  Filled 2017-08-08 (×4): qty 1

## 2017-08-08 MED ORDER — SODIUM CHLORIDE 0.9 % IV SOLN: INTRAVENOUS | Status: AC

## 2017-08-08 MED ORDER — ONDANSETRON HCL 4 MG/2ML IJ SOLN: 4.0000 mg | Freq: Four times a day (QID) | INTRAMUSCULAR | Status: DC | PRN

## 2017-08-08 MED ORDER — MIDAZOLAM HCL 2 MG/2ML IJ SOLN
INTRAMUSCULAR | Status: DC | PRN
  Administered 2017-08-08 (×2): 1 mg via INTRAVENOUS

## 2017-08-08 MED ORDER — NITROGLYCERIN 1 MG/10 ML FOR IR/CATH LAB
INTRA_ARTERIAL | Status: AC
  Filled 2017-08-08: qty 10

## 2017-08-08 MED ORDER — OXYCODONE HCL 5 MG PO TABS
5.0000 mg | ORAL_TABLET | ORAL | Status: DC | PRN
  Administered 2017-08-08 (×2): 5 mg via ORAL
  Administered 2017-08-11 – 2017-08-12 (×3): 10 mg via ORAL
  Filled 2017-08-08: qty 2
  Filled 2017-08-08 (×2): qty 1
  Filled 2017-08-08 (×2): qty 2
  Filled 2017-08-08: qty 1

## 2017-08-08 MED ORDER — LABETALOL HCL 5 MG/ML IV SOLN
INTRAVENOUS | Status: DC | PRN
  Administered 2017-08-08: 10 mg via INTRAVENOUS

## 2017-08-08 MED ORDER — ANGIOPLASTY BOOK
Freq: Once | Status: AC
  Administered 2017-08-08: 1
  Filled 2017-08-08: qty 1

## 2017-08-08 MED ORDER — LABETALOL HCL 5 MG/ML IV SOLN
INTRAVENOUS | Status: AC
  Filled 2017-08-08: qty 4

## 2017-08-08 MED ORDER — SODIUM CHLORIDE 0.9 % WEIGHT BASED INFUSION
3.0000 mL/kg/h | INTRAVENOUS | Status: DC
  Administered 2017-08-08: 3 mL/kg/h via INTRAVENOUS

## 2017-08-08 MED ORDER — ENOXAPARIN SODIUM 40 MG/0.4ML ~~LOC~~ SOLN
40.0000 mg | SUBCUTANEOUS | Status: DC
  Administered 2017-08-09 – 2017-08-12 (×3): 40 mg via SUBCUTANEOUS
  Filled 2017-08-08 (×3): qty 0.4

## 2017-08-08 SURGICAL SUPPLY — 28 items
BALLN SAPPHIRE ~~LOC~~ 3.0X12 (BALLOONS) ×2 IMPLANT
BALLN ~~LOC~~ EMERGE MR 3.5X12 (BALLOONS) ×2
BALLOON ~~LOC~~ EMERGE MR 3.5X12 (BALLOONS) ×1 IMPLANT
CATH BALLN WEDGE 5F 110CM (CATHETERS) ×2 IMPLANT
CATH INFINITI 5 FR JL3.5 (CATHETERS) ×2 IMPLANT
CATH INFINITI JR4 5F (CATHETERS) ×2 IMPLANT
CATH LAUNCHER 5F RADR (CATHETERS) ×1 IMPLANT
CATH VISTA GUIDE RCB (CATHETERS) ×2 IMPLANT
CATHETER LAUNCHER 5F RADR (CATHETERS) ×2
COVER PRB 48X5XTLSCP FOLD TPE (BAG) ×1 IMPLANT
COVER PROBE 5X48 (BAG) ×1
DEVICE RAD COMP TR BAND LRG (VASCULAR PRODUCTS) ×2 IMPLANT
ELECT DEFIB PAD ADLT CADENCE (PAD) ×2 IMPLANT
GLIDESHEATH SLEND A-KIT 6F 22G (SHEATH) ×2 IMPLANT
GUIDEWIRE .025 260CM (WIRE) ×2 IMPLANT
GUIDEWIRE INQWIRE 1.5J.035X260 (WIRE) ×1 IMPLANT
INQWIRE 1.5J .035X260CM (WIRE) ×2
KIT ENCORE 26 ADVANTAGE (KITS) ×2 IMPLANT
KIT HEART LEFT (KITS) ×2 IMPLANT
PACK CARDIAC CATHETERIZATION (CUSTOM PROCEDURE TRAY) ×2 IMPLANT
SHEATH RAIN 4/5FR (SHEATH) ×2 IMPLANT
STENT SYNERGY DES 3X12 (Permanent Stent) ×2 IMPLANT
TRANSDUCER W/STOPCOCK (MISCELLANEOUS) ×4 IMPLANT
TUBING ART PRESS 72  MALE/FEM (TUBING) ×1
TUBING ART PRESS 72 MALE/FEM (TUBING) ×1 IMPLANT
TUBING CIL FLEX 10 FLL-RA (TUBING) ×2 IMPLANT
WIRE ASAHI PROWATER 180CM (WIRE) ×2 IMPLANT
WIRE HI TORQ VERSACORE-J 145CM (WIRE) ×2 IMPLANT

## 2017-08-08 NOTE — Interval H&P Note (Signed)
Cath Lab Visit (complete for each Cath Lab visit)  Clinical Evaluation Leading to the Procedure:   ACS: No.  Non-ACS:    Anginal Classification: CCS III  Anti-ischemic medical therapy: Minimal Therapy (1 class of medications)  Non-Invasive Test Results: No non-invasive testing performed  Prior CABG: No previous CABG      History and Physical Interval Note:  08/08/2017 1:14 PM  Elizabeth Estrada  has presented today for surgery, with the diagnosis of abnormal stress test, excertional dyspnea  The various methods of treatment have been discussed with the patient and family. After consideration of risks, benefits and other options for treatment, the patient has consented to  Procedure(s): RIGHT/LEFT HEART CATH AND CORONARY ANGIOGRAPHY (N/A) as a surgical intervention .  The patient's history has been reviewed, patient examined, no change in status, stable for surgery.  I have reviewed the patient's chart and labs.  Questions were answered to the patient's satisfaction.     [name] III

## 2017-08-09 DIAGNOSIS — I272 Pulmonary hypertension, unspecified: Secondary | ICD-10-CM | POA: Diagnosis not present

## 2017-08-09 DIAGNOSIS — I25118 Atherosclerotic heart disease of native coronary artery with other forms of angina pectoris: Secondary | ICD-10-CM | POA: Diagnosis not present

## 2017-08-09 DIAGNOSIS — R0609 Other forms of dyspnea: Secondary | ICD-10-CM | POA: Diagnosis not present

## 2017-08-09 DIAGNOSIS — R197 Diarrhea, unspecified: Secondary | ICD-10-CM | POA: Diagnosis not present

## 2017-08-09 DIAGNOSIS — R9439 Abnormal result of other cardiovascular function study: Secondary | ICD-10-CM | POA: Diagnosis present

## 2017-08-09 DIAGNOSIS — I471 Supraventricular tachycardia: Secondary | ICD-10-CM | POA: Diagnosis not present

## 2017-08-09 DIAGNOSIS — M069 Rheumatoid arthritis, unspecified: Secondary | ICD-10-CM | POA: Diagnosis not present

## 2017-08-09 DIAGNOSIS — R001 Bradycardia, unspecified: Secondary | ICD-10-CM | POA: Diagnosis not present

## 2017-08-09 DIAGNOSIS — F339 Major depressive disorder, recurrent, unspecified: Secondary | ICD-10-CM | POA: Diagnosis not present

## 2017-08-09 DIAGNOSIS — I10 Essential (primary) hypertension: Secondary | ICD-10-CM | POA: Diagnosis not present

## 2017-08-09 DIAGNOSIS — I2510 Atherosclerotic heart disease of native coronary artery without angina pectoris: Secondary | ICD-10-CM | POA: Diagnosis not present

## 2017-08-09 DIAGNOSIS — I442 Atrioventricular block, complete: Secondary | ICD-10-CM | POA: Diagnosis not present

## 2017-08-09 DIAGNOSIS — I341 Nonrheumatic mitral (valve) prolapse: Secondary | ICD-10-CM | POA: Diagnosis not present

## 2017-08-09 DIAGNOSIS — I97630 Postprocedural hematoma of a circulatory system organ or structure following a cardiac catheterization: Secondary | ICD-10-CM | POA: Diagnosis not present

## 2017-08-09 LAB — CBC
HCT: 37.5 % (ref 36.0–46.0)
Hemoglobin: 11.8 g/dL — ABNORMAL LOW (ref 12.0–15.0)
MCH: 28 pg (ref 26.0–34.0)
MCHC: 31.5 g/dL (ref 30.0–36.0)
MCV: 89.1 fL (ref 78.0–100.0)
Platelets: 135 10*3/uL — ABNORMAL LOW (ref 150–400)
RBC: 4.21 MIL/uL (ref 3.87–5.11)
RDW: 14.2 % (ref 11.5–15.5)
WBC: 6.8 10*3/uL (ref 4.0–10.5)

## 2017-08-09 LAB — BASIC METABOLIC PANEL
Anion gap: 8 (ref 5–15)
BUN: 16 mg/dL (ref 6–20)
CO2: 25 mmol/L (ref 22–32)
Calcium: 8.7 mg/dL — ABNORMAL LOW (ref 8.9–10.3)
Chloride: 105 mmol/L (ref 101–111)
Creatinine, Ser: 0.94 mg/dL (ref 0.44–1.00)
GFR calc Af Amer: >60 mL/min (ref >60)
GFR calc non Af Amer: 57 mL/min — ABNORMAL LOW (ref >60)
Glucose, Bld: 96 mg/dL (ref 65–99)
Potassium: 4.1 mmol/L (ref 3.5–5.1)
Sodium: 138 mmol/L (ref 135–145)

## 2017-08-09 NOTE — Progress Notes (Signed)
Progress Note  Patient Name: Elizabeth Estrada Date of Encounter: 08/09/2017  Primary Cardiologist: Peter Martinique, MD   Subjective   No chest pain, weak when up  Inpatient Medications    Scheduled Meds: . aspirin  81 mg Oral Daily  . atorvastatin  80 mg Oral q1800  . cholecalciferol  1,000 Units Oral Daily  . clopidogrel  75 mg Oral Q breakfast  . enoxaparin (LOVENOX) injection  40 mg Subcutaneous Q24H  . escitalopram  10 mg Oral Daily  . guaiFENesin  5 mL Oral Once  . sodium chloride flush  3 mL Intravenous Q12H   Continuous Infusions: . sodium chloride     PRN Meds: sodium chloride, acetaminophen, ALPRAZolam, loratadine, ondansetron (ZOFRAN) IV, oxyCODONE, sodium chloride flush   Vital Signs    Vitals:   08/08/17 2000 08/08/17 2100 08/09/17 0345 08/09/17 0713  BP: (!) 137/50 (!) 105/58 (!) 136/40 (!) 139/57  Pulse:   (!) 42 (!) 55  Resp: 13 18 15 13   Temp:   98 F (36.7 C) 98 F (36.7 C)  TempSrc:   Oral Oral  SpO2: 99% 97% 98% 97%  Weight:   135 lb 2.3 oz (61.3 kg)   Height:        Intake/Output Summary (Last 24 hours) at 08/09/2017 0840 Last data filed at 08/09/2017 0345 Gross per 24 hour  Intake 406.25 ml  Output 600 ml  Net -193.75 ml   Filed Weights   08/08/17 0819 08/09/17 0345  Weight: 122 lb (55.3 kg) 135 lb 2.3 oz (61.3 kg)    Telemetry    AVD rate 40's - Personally Reviewed  ECG     AVD -HR 42- Personally Reviewed  Physical Exam   GEN: No acute distress.   Neck: No JVD Cardiac: RRR, no murmurs, rubs, or gallops.  Respiratory: Clear to auscultation bilaterally. GI: Soft, nontender, non-distended  MS: No edema; No deformity. Neuro:  Nonfocal  Psych: Normal affect   Labs    Chemistry Recent Labs  Lab 08/08/17 1040 08/09/17 0359  NA 140 138  K 4.3 4.1  CL 109 105  CO2 24 25  GLUCOSE 83 96  BUN 23* 16  CREATININE 0.83 0.94  CALCIUM 8.6* 8.7*  GFRNONAA >60 57*  GFRAA >60 >60  ANIONGAP 7 8     Hematology Recent Labs   Lab 08/08/17 0943 08/09/17 0359  WBC 4.9 6.8  RBC 4.43 4.21  HGB 12.6 11.8*  HCT 39.4 37.5  MCV 88.9 89.1  MCH 28.4 28.0  MCHC 32.0 31.5  RDW 14.0 14.2  PLT 152 135*    Cardiac EnzymesNo results for input(s): TROPONINI in the last 168 hours. No results for input(s): TROPIPOC in the last 168 hours.   BNPNo results for input(s): BNP, PROBNP in the last 168 hours.   DDimer No results for input(s): DDIMER in the last 168 hours.   Radiology    No results found.  Cardiac Studies   Cath 08/08/17 Echo 07/31/17 Stress 08/05/17  Patient Profile     77 y.o. female seen in the office 07/14/17 for weakness and DOE. Echo showed preserved LVF but question of MS. Stress test showed AVB and chest pain. She was admitted for Rt and Lt heart cath.  Assessment & Plan    CAD- s/p RCA PCI with DES  AVD- this persisted post PCI and she is symptomatic  MS- not severe by cath  Plan: Dr Lovena Le to see, transfer to telemetry.   For  questions or updates, please contact [place] Please consult [email or web address] for contact info under Cardiology/STEMI.      Signed, Kerin Ransom, PA-C  08/09/2017, 8:40 AM

## 2017-08-09 NOTE — Progress Notes (Signed)
CARDIAC REHAB PHASE I   PRE:  Rate/Rhythm: 42-45 SB with some 2 HB  BP:  Supine: 100/32  Sitting: 128/37  Standing:    SaO2: 99%RA  MODE:  Ambulation: 500 ft   POST:  Rate/Rhythm: 52 SB 2HB  BP:  Supine: 140/27  Sitting:   Standing:    SaO2: 98%RA 0800-0855 Pt denied dizziness with walk. Did c/o feeling a little SOB. No CP. Pt walked 500 ft with hand held asst. Daughter stated it looked like pt going to right side. I did not notice this but pt was a little weak. Reviewed importance of plavix with stent. Reviewed CRP 2 and referred to [place]. Gave heart healthy diet. Did not review ex ed until rhythm more stable. Will follow up Monday if pt still here. To bed after walk with daughter in room.   Graylon Good, RN BSN  08/09/2017 8:51 AM

## 2017-08-10 DIAGNOSIS — R9439 Abnormal result of other cardiovascular function study: Secondary | ICD-10-CM

## 2017-08-10 DIAGNOSIS — I10 Essential (primary) hypertension: Secondary | ICD-10-CM | POA: Diagnosis not present

## 2017-08-10 DIAGNOSIS — I25118 Atherosclerotic heart disease of native coronary artery with other forms of angina pectoris: Secondary | ICD-10-CM | POA: Diagnosis not present

## 2017-08-10 DIAGNOSIS — I272 Pulmonary hypertension, unspecified: Secondary | ICD-10-CM | POA: Diagnosis not present

## 2017-08-10 DIAGNOSIS — I442 Atrioventricular block, complete: Secondary | ICD-10-CM | POA: Diagnosis not present

## 2017-08-10 DIAGNOSIS — I97630 Postprocedural hematoma of a circulatory system organ or structure following a cardiac catheterization: Secondary | ICD-10-CM | POA: Diagnosis not present

## 2017-08-10 DIAGNOSIS — R001 Bradycardia, unspecified: Secondary | ICD-10-CM | POA: Diagnosis not present

## 2017-08-10 MED ORDER — SODIUM CHLORIDE 0.9 % IV SOLN
INTRAVENOUS | Status: DC
  Administered 2017-08-11: 1000 mL via INTRAVENOUS

## 2017-08-10 MED ORDER — YOU HAVE A PACEMAKER BOOK
Freq: Once | Status: AC
Start: 2017-08-10 — End: 2017-08-10
  Administered 2017-08-10
  Filled 2017-08-10: qty 1

## 2017-08-10 MED ORDER — SODIUM CHLORIDE 0.9 % IV SOLN
80.0000 mg | INTRAVENOUS | Status: AC
  Administered 2017-08-11: 80 mg

## 2017-08-10 MED ORDER — CEFAZOLIN SODIUM-DEXTROSE 2-4 GM/100ML-% IV SOLN
2.0000 g | INTRAVENOUS | Status: AC
  Administered 2017-08-11: 2 g via INTRAVENOUS

## 2017-08-10 NOTE — Consult Note (Signed)
Cardiology Consultation:   Patient ID: Elizabeth Estrada; [id number]; Aug 28, 1940   Admit date: 08/08/2017 Date of Consult: 08/10/2017  Primary Care Provider: [name], MD Primary Cardiologist: Peter Martinique, MD  Primary Electrophysiologist:  new   Patient Profile:   Elizabeth Estrada is a 77 y.o. female with a hx of CAD who is being seen today for the evaluation of CHB at the request of Dr. Wynonia Lawman.  History of Present Illness:   Elizabeth Estrada is a very pleasant 77 year old woman with a history of exercise-induced dyspnea, who was found to have an 80% right coronary artery stenosis status post PCI who is referred today for evaluation of heart block.  The patient presented after experiencing chest tightness with exertion.  Previously she was noted to have intermittent heart block on a heart monitor.  Heart catheterization demonstrated an 80% right coronary artery stenosis for which she underwent successful PCI.  She has never had syncope, but has had periods where she felt like she might pass out but did not.  Status post angioplasty, she has had persistent intermittent complete heart block along with periods of AVWB,.  The patient has been on no AV nodal blocking agents.  She is on multiple medications for her rheumatoid arthritis.  Past Medical History:  Diagnosis Date  . Anxiety   . AV block   . Chronic depression   . Coronary artery disease   . Heart murmur    NO PROBLEMS AS ADULT  . History of kidney stones   . Hypertension   . Kidney stone   . Melanoma ([place])    removed from back  . MVP (mitral valve prolapse)   . Osteoporosis   . RA (rheumatoid arthritis) ([place])   . Rheumatoid arthritis(714.0)    HANDS. KNEES  . Rib fracture     Past Surgical History:  Procedure Laterality Date  . BREAST SURGERY     LEFT BREAST BX- BENIGN  . CARDIAC CATHETERIZATION  08/08/2017  . CARDIOVASCULAR STRESS TEST  10/19/1999, 2013   EF 97%, NO EVIDENCE OF ISCHEMIA, 2013 NORMAL  .  COLONOSCOPY    . CORONARY STENT PLACEMENT  08/08/2017  . DILATATION & CURRETTAGE/HYSTEROSCOPY WITH RESECTOCOPE N/A 07/06/2013   Procedure: DILATATION & CURETTAGE/HYSTEROSCOPY WITH RESECTOCOPE with removal of endometrial lesion;  Surgeon: [name], MD;  Location: [facility];  Service: Gynecology;  Laterality: N/A;  . EYE SURGERY     BILATERAL CATARACT  . melanoma removed     from back  . REMOVAL OF CYST FROM HER LEFT BREAST Left 8/05   benign - Dr. Margot Chimes  . US ECHOCARDIOGRAPHY  06/04/2004   EF 60-65%     Home Medications:  Prior to Admission medications   Medication Sig Start Date End Date Taking? Authorizing Provider  abatacept (ORENCIA) 250 MG injection Inject 250 mg into the vein every 30 (thirty) days.    Yes [provider]  ALPRAZolam (XANAX) 0.25 MG tablet Take 1 tablet (0.25 mg total) by mouth as needed for anxiety. 08/07/17  Yes Erlene Quan, PA-C  aspirin 81 MG tablet Take 81 mg by mouth every other day.    Yes [provider]  cetirizine (ZYRTEC) 10 MG tablet Take 10 mg by mouth daily as needed for allergies.   Yes [provider]  cholecalciferol (VITAMIN D) 1000 UNITS tablet Take 1,000 Units by mouth daily.   Yes [provider]  escitalopram (LEXAPRO) 10 MG tablet Take 10 mg by mouth daily.  Yes [provider]  fluticasone (FLONASE) 50 MCG/ACT nasal spray Place 1-2 sprays into both nostrils daily as needed for allergies or rhinitis.   Yes [provider]  Polyvinyl Alcohol-Povidone (REFRESH OP) Place 1-2 drops into both eyes daily as needed (for dry eyes).   Yes [provider]    Inpatient Medications: Scheduled Meds: . aspirin  81 mg Oral Daily  . atorvastatin  80 mg Oral q1800  . cholecalciferol  1,000 Units Oral Daily  . clopidogrel  75 mg Oral Q breakfast  . enoxaparin (LOVENOX) injection  40 mg Subcutaneous Q24H  . escitalopram  10 mg Oral Daily  . sodium chloride flush  3 mL Intravenous Q12H    Continuous Infusions: . sodium chloride     PRN Meds: sodium chloride, acetaminophen, ALPRAZolam, loratadine, ondansetron (ZOFRAN) IV, oxyCODONE, sodium chloride flush  Allergies:    Allergies  Allergen Reactions  . Methotrexate Derivatives Itching and Swelling  . Plaquenil [Hydroxychloroquine Sulfate] Other (See Comments)    Unknown reaction   . Remicade [Infliximab] Other (See Comments)    Unknown reaction  . Ridaura [Auranofin] Other (See Comments)    Does not remember  . Sudafed [Pseudoephedrine] Other (See Comments)    nervous    Social History:   Social History   Socioeconomic History  . Marital status: Married    Spouse name: Not on file  . Number of children: 0  . Years of education: Not on file  . Highest education level: Not on file  Occupational History  . Not on file  Social Needs  . Financial resource strain: Not on file  . Food insecurity:    Worry: Not on file    Inability: Not on file  . Transportation needs:    Medical: Not on file    Non-medical: Not on file  Tobacco Use  . Smoking status: Never Smoker  . Smokeless tobacco: Never Used  Substance and Sexual Activity  . Alcohol use: No  . Drug use: No  . Sexual activity: Never    Partners: Male    Birth control/protection: Post-menopausal  Lifestyle  . Physical activity:    Days per week: Not on file    Minutes per session: Not on file  . Stress: Not on file  Relationships  . Social connections:    Talks on phone: Not on file    Gets together: Not on file    Attends religious service: Not on file    Active member of club or organization: Not on file    Attends meetings of clubs or organizations: Not on file    Relationship status: Not on file  . Intimate partner violence:    Fear of current or ex partner: Not on file    Emotionally abused: Not on file    Physically abused: Not on file    Forced sexual activity: Not on file  Other Topics Concern  . Not on file  Social History  Narrative  . Not on file    Family History:    Family History  Problem Relation Age of Onset  . Coronary artery disease Mother   . Pancreatic cancer Father   . Heart disease Father        bypass  . Hypertension Father   . CAD Brother 16       CABG and AVR     ROS:  Please see the history of present illness.  All other ROS reviewed and negative.  Physical Exam/Data:   Vitals:   08/09/17 2300 08/10/17 0325 08/10/17 0358 08/10/17 0700  BP: (!) 121/38 (!) 175/61 (!) 135/59 (!) 157/49  Pulse: (!) 47 (!) 40 (!) 43 (!) 47  Resp: 13 18 20 20   Temp:  97.9 F (36.6 C)  97.7 F (36.5 C)  TempSrc:  Oral  Oral  SpO2: 100% 100% 98% 100%  Weight:      Height:        Intake/Output Summary (Last 24 hours) at 08/10/2017 0855 Last data filed at 08/10/2017 0332 Gross per 24 hour  Intake 840 ml  Output 850 ml  Net -10 ml   Filed Weights   08/08/17 0819 08/09/17 0345 08/09/17 1100  Weight: 122 lb (55.3 kg) 135 lb 2.3 oz (61.3 kg) 121 lb 14.4 oz (55.3 kg)   Body mass index is 22.3 kg/m.  General:  Well nourished, well developed, in no acute distress HEENT: normal Lymph: no adenopathy Neck: no JVD Endocrine:  No thryomegaly Vascular: No carotid bruits; FA pulses 2+ bilaterally without bruits  Cardiac:  normal S1, S2; irregular bradycardia; no murmur  Lungs:  clear to auscultation bilaterally, no wheezing, rhonchi or rales  Abd: soft, nontender, no hepatomegaly  Ext: no edema, right lower arm with some ecchymoses, but the hand is warm Musculoskeletal:  No deformities, BUE and BLE strength normal and equal Skin: warm and dry  Neuro:  CNs 2-12 intact, no focal abnormalities noted Psych:  Normal affect   EKG:  The EKG was personally reviewed and demonstrates: Normal sinus rhythm with intermittent complete heart block alternating with periods of AV WB Telemetry:  Telemetry was personally reviewed and demonstrates: Normal sinus rhythm with CHB, alternating with AV WB.  Relevant  CV Studies: Normal left ventricular systolic function, small pericardial effusion  Laboratory Data:  Chemistry Recent Labs  Lab 08/08/17 1040 08/09/17 0359  NA 140 138  K 4.3 4.1  CL 109 105  CO2 24 25  GLUCOSE 83 96  BUN 23* 16  CREATININE 0.83 0.94  CALCIUM 8.6* 8.7*  GFRNONAA >60 57*  GFRAA >60 >60  ANIONGAP 7 8    No results for input(s): PROT, ALBUMIN, AST, ALT, ALKPHOS, BILITOT in the last 168 hours. Hematology Recent Labs  Lab 08/08/17 0943 08/09/17 0359  WBC 4.9 6.8  RBC 4.43 4.21  HGB 12.6 11.8*  HCT 39.4 37.5  MCV 88.9 89.1  MCH 28.4 28.0  MCHC 32.0 31.5  RDW 14.0 14.2  PLT 152 135*   Cardiac EnzymesNo results for input(s): TROPONINI in the last 168 hours. No results for input(s): TROPIPOC in the last 168 hours.  BNPNo results for input(s): BNP, PROBNP in the last 168 hours.  DDimer No results for input(s): DDIMER in the last 168 hours.  Radiology/Studies:  No results found.  Assessment and Plan:   1. Complete heart block, alternating with AV WB -I have discussed the treatment options with the patient.  The risks, goals, benefits, and expectations of permanent pacemaker insertion were reviewed.  She wishes to proceed. 2. Coronary artery disease -she is status post PCI of the right coronary artery.  No evidence of complications at this point. 3.  Dyspnea on exertion -hopefully this will resolve when she has undergone pacemaker insertion and after PCI of the right coronary artery.  She has preserved left ventricular systolic function.  For questions or updates, please contact [place] Please consult [email or web address] for contact info under Cardiology/STEMI.   Signed, Cristopher Peru, MD  08/10/2017  8:55 AM

## 2017-08-11 ENCOUNTER — Encounter (HOSPITAL_COMMUNITY): Payer: Self-pay | Admitting: Interventional Cardiology

## 2017-08-11 ENCOUNTER — Ambulatory Visit (HOSPITAL_COMMUNITY)
Admission: RE | Disposition: A | Discharge status: Home / Self Care | Payer: Self-pay | Source: Ambulatory Visit | Attending: Interventional Cardiology

## 2017-08-11 DIAGNOSIS — I25118 Atherosclerotic heart disease of native coronary artery with other forms of angina pectoris: Secondary | ICD-10-CM | POA: Diagnosis not present

## 2017-08-11 DIAGNOSIS — R001 Bradycardia, unspecified: Secondary | ICD-10-CM | POA: Diagnosis not present

## 2017-08-11 DIAGNOSIS — I272 Pulmonary hypertension, unspecified: Secondary | ICD-10-CM | POA: Diagnosis not present

## 2017-08-11 DIAGNOSIS — I97630 Postprocedural hematoma of a circulatory system organ or structure following a cardiac catheterization: Secondary | ICD-10-CM | POA: Diagnosis not present

## 2017-08-11 DIAGNOSIS — I442 Atrioventricular block, complete: Secondary | ICD-10-CM

## 2017-08-11 DIAGNOSIS — I10 Essential (primary) hypertension: Secondary | ICD-10-CM | POA: Diagnosis not present

## 2017-08-11 HISTORY — PX: PACEMAKER IMPLANT: EP1218

## 2017-08-11 LAB — BASIC METABOLIC PANEL
Anion gap: 7 (ref 5–15)
BUN: 17 mg/dL (ref 6–20)
CO2: 24 mmol/L (ref 22–32)
Calcium: 8.8 mg/dL — ABNORMAL LOW (ref 8.9–10.3)
Chloride: 108 mmol/L (ref 101–111)
Creatinine, Ser: 0.9 mg/dL (ref 0.44–1.00)
GFR calc Af Amer: >60 mL/min (ref >60)
GFR calc non Af Amer: >60 mL/min (ref >60)
Glucose, Bld: 96 mg/dL (ref 65–99)
Potassium: 4.2 mmol/L (ref 3.5–5.1)
Sodium: 139 mmol/L (ref 135–145)

## 2017-08-11 LAB — SURGICAL PCR SCREEN
MRSA, PCR: NEGATIVE
Staphylococcus aureus: POSITIVE — AB

## 2017-08-11 LAB — MAGNESIUM: Magnesium: 2 mg/dL (ref 1.7–2.4)

## 2017-08-11 SURGERY — PACEMAKER IMPLANT
Anesthesia: LOCAL

## 2017-08-11 MED ORDER — MIDAZOLAM HCL 5 MG/5ML IJ SOLN
INTRAMUSCULAR | Status: DC | PRN
  Administered 2017-08-11: 1 mg via INTRAVENOUS

## 2017-08-11 MED ORDER — FENTANYL CITRATE (PF) 100 MCG/2ML IJ SOLN
INTRAMUSCULAR | Status: AC
  Filled 2017-08-11: qty 2

## 2017-08-11 MED ORDER — HEPARIN (PORCINE) IN NACL 2-0.9 UNITS/ML
INTRAMUSCULAR | Status: AC | PRN
  Administered 2017-08-11: 500 mL

## 2017-08-11 MED ORDER — CEFAZOLIN SODIUM-DEXTROSE 1-4 GM/50ML-% IV SOLN
1.0000 g | Freq: Four times a day (QID) | INTRAVENOUS | Status: AC
  Administered 2017-08-11 – 2017-08-12 (×3): 1 g via INTRAVENOUS
  Filled 2017-08-11 (×3): qty 50

## 2017-08-11 MED ORDER — FENTANYL CITRATE (PF) 100 MCG/2ML IJ SOLN
INTRAMUSCULAR | Status: DC | PRN
  Administered 2017-08-11: 25 ug via INTRAVENOUS

## 2017-08-11 MED ORDER — ACETAMINOPHEN 325 MG PO TABS: 325.0000 mg | ORAL_TABLET | ORAL | Status: DC | PRN

## 2017-08-11 MED ORDER — MUPIROCIN 2 % EX OINT
1.0000 "application " | TOPICAL_OINTMENT | Freq: Two times a day (BID) | CUTANEOUS | Status: DC
  Administered 2017-08-11 – 2017-08-12 (×3): 1 via NASAL

## 2017-08-11 MED ORDER — LIDOCAINE HCL 1 % IJ SOLN
INTRAMUSCULAR | Status: AC
  Filled 2017-08-11: qty 60

## 2017-08-11 MED ORDER — LIDOCAINE HCL (PF) 1 % IJ SOLN
INTRAMUSCULAR | Status: DC | PRN
  Administered 2017-08-11: 60 mL

## 2017-08-11 MED ORDER — ONDANSETRON HCL 4 MG/2ML IJ SOLN: 4.0000 mg | Freq: Four times a day (QID) | INTRAMUSCULAR | Status: DC | PRN

## 2017-08-11 MED ORDER — CEFAZOLIN SODIUM-DEXTROSE 2-4 GM/100ML-% IV SOLN
INTRAVENOUS | Status: AC
  Filled 2017-08-11: qty 100

## 2017-08-11 MED ORDER — SODIUM CHLORIDE 0.9 % IV SOLN
INTRAVENOUS | Status: AC
  Filled 2017-08-11: qty 2

## 2017-08-11 MED ORDER — CHLORHEXIDINE GLUCONATE CLOTH 2 % EX PADS: 6.0000 | MEDICATED_PAD | Freq: Every day | CUTANEOUS | Status: DC

## 2017-08-11 MED ORDER — LIDOCAINE HCL 1 % IJ SOLN
INTRAMUSCULAR | Status: AC
  Filled 2017-08-11: qty 20

## 2017-08-11 MED ORDER — HEPARIN (PORCINE) IN NACL 1000-0.9 UT/500ML-% IV SOLN
INTRAVENOUS | Status: AC
  Filled 2017-08-11: qty 500

## 2017-08-11 MED ORDER — MUPIROCIN 2 % EX OINT
TOPICAL_OINTMENT | CUTANEOUS | Status: AC
  Administered 2017-08-11: 1
  Filled 2017-08-11: qty 22

## 2017-08-11 MED ORDER — MIDAZOLAM HCL 5 MG/5ML IJ SOLN
INTRAMUSCULAR | Status: AC
  Filled 2017-08-11: qty 5

## 2017-08-11 MED FILL — Nitroglycerin IV Soln 100 MCG/ML in D5W: INTRA_ARTERIAL | Qty: 10 | Status: AC

## 2017-08-11 MED FILL — Heparin Sod (Porcine)-NaCl IV Soln 1000 Unit/500ML-0.9%: INTRAVENOUS | Qty: 1000 | Status: AC

## 2017-08-11 SURGICAL SUPPLY — 12 items
CABLE SURGICAL S-101-97-12 (CABLE) ×4 IMPLANT
CATH RIGHTSITE C315HIS02 (CATHETERS) ×2 IMPLANT
IPG PACE AZUR XT DR MRI W1DR01 (Pacemaker) ×1 IMPLANT
LEAD CAPSURE NOVUS 5076-52CM (Lead) ×2 IMPLANT
LEAD SELECT SECURE 3830 383069 (Lead) ×1 IMPLANT
PACE AZURE XT DR MRI W1DR01 (Pacemaker) ×2 IMPLANT
PAD DEFIB LIFELINK (PAD) ×2 IMPLANT
SELECT SECURE 3830 383069 (Lead) ×2 IMPLANT
SHEATH CLASSIC 7F (SHEATH) ×4 IMPLANT
SLITTER 6232ADJ (MISCELLANEOUS) ×2 IMPLANT
TRAY PACEMAKER INSERTION (PACKS) ×2 IMPLANT
WIRE HI TORQ VERSACORE-J 145CM (WIRE) ×2 IMPLANT

## 2017-08-11 NOTE — Progress Notes (Addendum)
Md notified of positive PCR. Staphlococcus Aurenus.  Family and pt educated. Had pt to remove yellow color small ring and placed in zip bag then placed in black and pink bag with daughter.    Saunders Revel T

## 2017-08-11 NOTE — Progress Notes (Addendum)
Electrophysiology Rounding Note  Patient Name: Elizabeth Estrada Date of Encounter: 08/11/2017  Primary Cardiologist: [place] Electrophysiologist: Curt Bears (new this admission)   Subjective   The patient is doing well today.  At this time, the patient denies chest pain, shortness of breath, or any new concerns.  She had diarrhea yesterday, no fever, no chills. Diarrhea has resolved overnight last night.   Inpatient Medications    Scheduled Meds: . aspirin  81 mg Oral Daily  . atorvastatin  80 mg Oral q1800  . Chlorhexidine Gluconate Cloth  6 each Topical Daily  . cholecalciferol  1,000 Units Oral Daily  . clopidogrel  75 mg Oral Q breakfast  . enoxaparin (LOVENOX) injection  40 mg Subcutaneous Q24H  . escitalopram  10 mg Oral Daily  . gentamicin irrigation  80 mg Irrigation To Cath  . mupirocin ointment  1 application Nasal BID  . sodium chloride flush  3 mL Intravenous Q12H   Continuous Infusions: . sodium chloride    . sodium chloride 1,000 mL (08/11/17 0530)  .  ceFAZolin (ANCEF) IV     PRN Meds: sodium chloride, acetaminophen, ALPRAZolam, loratadine, ondansetron (ZOFRAN) IV, oxyCODONE, sodium chloride flush   Vital Signs    Vitals:   08/10/17 1949 08/10/17 2345 08/11/17 0445 08/11/17 0727  BP: 121/61 (!) 132/52 (!) 154/60 116/72  Pulse: (!) 42 (!) 53 (!) 39 (!) 39  Resp: 13 17 18 17   Temp: (!) 97.4 F (36.3 C) 98.4 F (36.9 C) 97.6 F (36.4 C) 97.9 F (36.6 C)  TempSrc: Oral Oral Oral Oral  SpO2: 98% 98% 98% 97%  Weight:      Height:        Intake/Output Summary (Last 24 hours) at 08/11/2017 0742 Last data filed at 08/11/2017 0600 Gross per 24 hour  Intake 625 ml  Output 650 ml  Net -25 ml   Filed Weights   08/08/17 0819 08/09/17 0345 08/09/17 1100  Weight: 122 lb (55.3 kg) 135 lb 2.3 oz (61.3 kg) 121 lb 14.4 oz (55.3 kg)    Physical Exam    GEN- The patient is well appearing, alert and oriented x 3 today.   Head- normocephalic, atraumatic Eyes-   Sclera clear, conjunctiva pink Ears- hearing intact Oropharynx- clear Neck- supple Lungs- Clear to ausculation bilaterally, normal work of breathing Heart- bradycardic regular rate and rhythm  GI- soft, NT, ND, + BS Extremities- no clubbing, cyanosis, or edema Skin- no rash or lesion Psych- euthymic mood, full affect Neuro- strength and sensation are intact  Labs    CBC Recent Labs    08/08/17 0943 08/09/17 0359  WBC 4.9 6.8  HGB 12.6 11.8*  HCT 39.4 37.5  MCV 88.9 89.1  PLT 152 485*   Basic Metabolic Panel Recent Labs    08/09/17 0359 08/11/17 0203  NA 138 139  K 4.1 4.2  CL 105 108  CO2 25 24  GLUCOSE 96 96  BUN 16 17  CREATININE 0.94 0.90  CALCIUM 8.7* 8.8*  MG  --  2.0     Telemetry    SR with complete heart block, PVC's (personally reviewed)  Radiology    No results found.   Assessment & Plan    1.  Complete heart block Plan for PPM implant today Risks, benefits reviewed with patient and family again this morning  2.  CAD S/p PCI No ischemic symptoms Per primary team  3.  HTN Will adjust meds after PPM implant  4.  Diarrhea  No fever, no chills WBC normal Has improved since last night   Signed, Chanetta Marshall, NP  08/11/2017, 7:42 AM   I have seen and examined this patient with Chanetta Marshall.  Agree with above, note added to reflect my findings.  On exam, bradycardic, no murmurs, lungs clear.  Patient with complete AV block. Had diarrhea overnight but now feeling much improved. Plan for pacemaker implant  TAMULA MORRICAL has presented today for surgery, with the diagnosis of complete AV block.  The various methods of treatment have been discussed with the patient and family. After consideration of risks, benefits and other options for treatment, the patient has consented to  Procedure(s): Pacemaker implant as a surgical intervention .  Risks include but not limited to bleeding, tamponade, heart block, stroke, damage to surrounding  organs, among others. The patient's history has been reviewed, patient examined, no change in status, stable for surgery.  I have reviewed the patient's chart and labs.  Questions were answered to the patient's satisfaction.     Will M. Camnitz MD 08/11/2017 8:20 AM

## 2017-08-11 NOTE — Progress Notes (Signed)
Back from the EP lab by bed awake and alert. Arm sling to left arm, instructed not to move left arm. Dressing to left upper chest dry and intact.

## 2017-08-11 NOTE — Progress Notes (Addendum)
Md notified of pt having multiple diarrhea episodes.  Daughter at bedside stated " doesn't smell anything like cdiff, but smells like someone sick" .  Also noted to having more PVC's on tele.  No AM labs ordered. pt is anxious but hr in 30-40 so no anxiety meds given.  New orders received.  Will r/o cdiff and obtain am labs bmet and Mg.  Will continue to monitor.  Educated pt and daughter . Saunders Revel T

## 2017-08-11 NOTE — Progress Notes (Signed)
Ice pack to right forearm applied x 20 min. and elevated with pillow. Denied any discomfort.

## 2017-08-11 NOTE — Progress Notes (Signed)
To EP lab by bed, stable.

## 2017-08-12 ENCOUNTER — Institutional Professional Consult (permissible substitution): Payer: Medicare Other | Admitting: Cardiology

## 2017-08-12 ENCOUNTER — Ambulatory Visit (HOSPITAL_COMMUNITY): Payer: Medicare Other

## 2017-08-12 DIAGNOSIS — I25118 Atherosclerotic heart disease of native coronary artery with other forms of angina pectoris: Secondary | ICD-10-CM | POA: Diagnosis not present

## 2017-08-12 DIAGNOSIS — I97630 Postprocedural hematoma of a circulatory system organ or structure following a cardiac catheterization: Secondary | ICD-10-CM | POA: Diagnosis not present

## 2017-08-12 DIAGNOSIS — I341 Nonrheumatic mitral (valve) prolapse: Secondary | ICD-10-CM | POA: Diagnosis not present

## 2017-08-12 DIAGNOSIS — R001 Bradycardia, unspecified: Secondary | ICD-10-CM | POA: Diagnosis not present

## 2017-08-12 DIAGNOSIS — I10 Essential (primary) hypertension: Secondary | ICD-10-CM | POA: Diagnosis not present

## 2017-08-12 DIAGNOSIS — I272 Pulmonary hypertension, unspecified: Secondary | ICD-10-CM | POA: Diagnosis not present

## 2017-08-12 DIAGNOSIS — M069 Rheumatoid arthritis, unspecified: Secondary | ICD-10-CM | POA: Diagnosis not present

## 2017-08-12 DIAGNOSIS — I471 Supraventricular tachycardia: Secondary | ICD-10-CM | POA: Diagnosis not present

## 2017-08-12 DIAGNOSIS — Z950 Presence of cardiac pacemaker: Secondary | ICD-10-CM | POA: Diagnosis not present

## 2017-08-12 DIAGNOSIS — R197 Diarrhea, unspecified: Secondary | ICD-10-CM | POA: Diagnosis not present

## 2017-08-12 DIAGNOSIS — I442 Atrioventricular block, complete: Secondary | ICD-10-CM | POA: Diagnosis not present

## 2017-08-12 DIAGNOSIS — R0609 Other forms of dyspnea: Secondary | ICD-10-CM | POA: Diagnosis not present

## 2017-08-12 DIAGNOSIS — F339 Major depressive disorder, recurrent, unspecified: Secondary | ICD-10-CM | POA: Diagnosis not present

## 2017-08-12 MED ORDER — ATORVASTATIN CALCIUM 80 MG PO TABS: 80.0000 mg | ORAL_TABLET | Freq: Every day | ORAL | 5 refills | Status: DC

## 2017-08-12 MED ORDER — CLOPIDOGREL BISULFATE 75 MG PO TABS: 75.0000 mg | ORAL_TABLET | Freq: Every day | ORAL | 11 refills | Status: DC

## 2017-08-12 MED FILL — Lidocaine HCl Local Inj 1%: INTRAMUSCULAR | Qty: 60 | Status: AC

## 2017-08-12 NOTE — Progress Notes (Signed)
   Electrophysiology Rounding Note  Patient Name: Elizabeth Estrada Date of Encounter: 08/12/2017  Primary Cardiologist: Martin[place] Electrophysiologist: Curt Bears (new this admission)   Subjective   The patient is feeling tired, hasn't slept well for many nights. NO chest pain.   Inpatient Medications    Scheduled Meds: . aspirin  81 mg Oral Daily  . atorvastatin  80 mg Oral q1800  . Chlorhexidine Gluconate Cloth  6 each Topical Daily  . cholecalciferol  1,000 Units Oral Daily  . clopidogrel  75 mg Oral Q breakfast  . enoxaparin (LOVENOX) injection  40 mg Subcutaneous Q24H  . escitalopram  10 mg Oral Daily  . mupirocin ointment  1 application Nasal BID  . sodium chloride flush  3 mL Intravenous Q12H   Continuous Infusions: . sodium chloride     PRN Meds: sodium chloride, acetaminophen, acetaminophen, ALPRAZolam, loratadine, ondansetron (ZOFRAN) IV, ondansetron (ZOFRAN) IV, oxyCODONE, sodium chloride flush   Vital Signs    Vitals:   08/11/17 1952 08/12/17 0000 08/12/17 0320 08/12/17 0802  BP: 121/65 (!) 141/72 (!) 127/56 125/60  Pulse: 62  69   Resp: 12 13 (!) 9 14  Temp: 98.7 F (37.1 C) 98.4 F (36.9 C) 98.6 F (37 C) 98.4 F (36.9 C)  TempSrc: Oral Oral Oral Oral  SpO2: 97% 99% 99% 95%  Weight:      Height:        Intake/Output Summary (Last 24 hours) at 08/12/2017 0826 Last data filed at 08/12/2017 6967 Gross per 24 hour  Intake 170 ml  Output 400 ml  Net -230 ml   Filed Weights   08/08/17 0819 08/09/17 0345 08/09/17 1100  Weight: 122 lb (55.3 kg) 135 lb 2.3 oz (61.3 kg) 121 lb 14.4 oz (55.3 kg)    Physical Exam    GEN- The patient is well appearing, alert and oriented x 3 today.   Head- normocephalic, atraumatic Eyes-  Sclera clear, conjunctiva pink Ears- hearing intact Oropharynx- clear Neck- supple Left upper chest - PM insertion site - no bleeding, mild hematoma around, no discharge or significant swelling Lungs- Clear to ausculation bilaterally,  normal work of breathing Heart- bradycardic regular rate and rhythm  GI- soft, NT, ND, + BS Extremities- no clubbing, cyanosis, or edema Skin- no rash or lesion Psych- euthymic mood, full affect Neuro- strength and sensation are intact  Labs    CBC No results for input(s): WBC, NEUTROABS, HGB, HCT, MCV, PLT in the last 72 hours. Basic Metabolic Panel Recent Labs    08/11/17 0203  NA 139  K 4.2  CL 108  CO2 24  GLUCOSE 96  BUN 17  CREATININE 0.90  CALCIUM 8.8*  MG 2.0     Telemetry    SR with complete heart block, PVC's (personally reviewed)  Radiology    No results found.   Assessment & Plan    1.  Complete heart block S/p PM PPM implant yesterday, wound looks well  2.  CAD S/p PCI and stent of the ostial RCA, Aspirin and Plavix for at least 6 months. No ischemic symptoms  3.  HTN Will adjust meds after PPM implant  4.  Diarrhea, improved No fever, no chills,  WBC normal  We will plan for a discharge today, she will need a follow up with Dr Tamala Julian and PM clinic.  Signed, Ena Dawley, MD  08/12/2017, 8:26 AM

## 2017-08-12 NOTE — Progress Notes (Signed)
Family states a staff member relating to the pacemaker confirmed they are coming to set up the app. Continuing to monitor. IV removed. Belongings collected. Will wheel pt out once ready.   [place]  [place], RN

## 2017-08-12 NOTE — Discharge Summary (Signed)
Discharge Summary    Patient ID: Elizabeth Estrada,  MRN: [id number], DOB/AGE: 05-05-1940 77 y.o.  Admit date: 08/08/2017 Discharge date: 08/12/2017  Primary Care Provider: [name] Primary Cardiologist: Peter Martinique, MD  Electrophysiologist: Dr. Curt Bears (new this admit)  Discharge Diagnoses    Principal Problem:   Abnormal stress ECG with treadmill Active Problems:   Mitral stenosis-mild   DOE (dyspnea on exertion)   PSVT (paroxysmal supraventricular tachycardia) (HCC)   Chest tightness   CAD S/P percutaneous coronary angioplasty   Allergies Allergies  Allergen Reactions  . Methotrexate Derivatives Itching and Swelling  . Plaquenil [Hydroxychloroquine Sulfate] Other (See Comments)    Unknown reaction   . Remicade [Infliximab] Other (See Comments)    Unknown reaction  . Ridaura [Auranofin] Other (See Comments)    Does not remember  . Sudafed [Pseudoephedrine] Other (See Comments)    nervous    Diagnostic Studies/Procedures    Procedures   CORONARY STENT INTERVENTION  RIGHT/LEFT HEART CATH AND CORONARY ANGIOGRAPHY 08/08/17  Conclusion    80% ostial RCA.  The RCA is dominant.  30 to 40% proximal and mid LAD.  70% proximal diagonal #1.  Widely patent circumflex with tortuous obtuse marginals.  Normal left main  Calcified mitral valve and annulus.  No significant mitral stenosis is noted.  Mild pulmonary hypertension  Normal left ventricular systolic function with elevated end-diastolic and pulmonary wedge pressure in the 20 to 22 mmHg range consistent with diastolic heart failure, chronic.  Successful PCI and stent of the ostial RCA from 80% with TIMI grade III flow to less than 10% with TIMI grade III flow using a 3.0 x 12 mm Synergy postdilated to 3.5 mmHg pressure.  RECOMMENDATIONS:   Aspirin and Plavix for at least 6 months.  Pacemaker may be needed if continued prolonged episodes of second-degree heart block with  bradycardia.  Aggressive risk factor modification.  Probable discharge in a.m. if no complications.   Procedures   PACEMAKER IMPLANT 08/11/17  Conclusion   SURGEON:  Will Meredith Leeds, MD     PREPROCEDURE DIAGNOSIS:  Complete AV block    POSTPROCEDURE DIAGNOSIS:  Complete AV block     PROCEDURES:   1. Pacemaker implantation.  Medtronic model [id number] (serial number [id number]) right atrial lead and a Medtronic model 5427 (serial number [id number]) right ventricular lead    2D Echo 07/31/17   - Left ventricle: The cavity size was normal. Wall thickness was   normal. Systolic function was normal. The estimated ejection   fraction was in the range of 60% to 65%. Wall motion was normal;   there were no regional wall motion abnormalities. Doppler   parameters are consistent with abnormal left ventricular   relaxation (grade 1 diastolic dysfunction). - Aortic valve: There was no stenosis. - Mitral valve: The findings are consistent with moderate stenosis.   There was no significant regurgitation. Mean gradient (D): 8 mm   Hg. Valve area by pressure half-time: 1.49 cm^2. - Left atrium: The atrium was moderately dilated. - Right ventricle: The cavity size was normal. Systolic function   was normal. - Tricuspid valve: Peak RV-RA gradient (S): 26 mm Hg. - Pulmonary arteries: PA peak pressure: 29 mm Hg (S). - Inferior vena cava: The vessel was normal in size. The   respirophasic diameter changes were in the normal range (>= 50%),   consistent with normal central venous pressure. - Pericardium, extracardiac: Small to moderate pericardial   effusion. < 25% respirophasic variation  of mitral E inflow   velocity. IVC is not dilated. No significant RV diastolic   collapse. No pericardial tamponade.  Impressions:  - Normal LV size with EF 60-65%. Normal RV size and systolic   function. 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 There was a small to  moderate pericardial   effusion without tamponade.   History of Present Illness     77 y/o female with h/o MVP and PSVT, seen recently in clinic with complaints of exertional dyspnea and vague chest tightness. While being evaluated in clinic on 07/14/17, she was noted to have a brief run of PSVT. She was set up for exercise tolerance test and 2 week cardiac monitor. Cardiac monitor showed NSR with periods of intermittent complete heart block and type 2 second degree AVB w/ 2:1 conduction. ETT was limited due to poor exercise tolerance. Pt only able to exercise for 1 min. She complained of chest discomfort during study and had hypertensive response to exercise. She developed 2:1 AV block w/ exertion that correlated with her chest pain and resolved during recovery. No ST changes. Based on findings, she was referred for definitive LHC.  Hospital Course     Pt presented to [facility] on 08/08/17 for planned [facility], performed by Dr. Tamala Julian. She was found to have 80% ostial RCA stenosis, treated with PCI + DES. EF normal. Despite intervention, she continued to have significant bradycardia with isorhythmic A-V dissociation and junctional rhythm and continued bradycardia. EP was consulted and recommended PPM implantation. This was done 08/11/17 by Dr. Curt Bears. Device implanted was Medtronic. She tolerated the procedure well and left the cath lab in stable condition. She had no post cath nor post device complications. Device pocket remained stable. CXR w/o pneumothorax. Device interrogation revealed normal functioning. EP felt that she was stable for d/c home. Pt also seen by Dr. Meda Coffee who felt that she was stable for d/c home from a general cardiology standpoint. She was discharged home on DAPT w/ ASA and Plavix along with high intensity statin. Wound check f/u has been arranged as well as f/u with Dr. Martinique and Dr. Curt Bears.    Consultants: EP    Discharge Vitals Blood pressure (!) 147/56, pulse 95, temperature 98.4 F  (36.9 C), temperature source Oral, resp. rate 18, height 5\' 2"  (1.575 m), weight 121 lb 14.4 oz (55.3 kg), last menstrual period 04/15/1990, SpO2 (!) 80 %.  Filed Weights   08/08/17 0819 08/09/17 0345 08/09/17 1100  Weight: 122 lb (55.3 kg) 135 lb 2.3 oz (61.3 kg) 121 lb 14.4 oz (55.3 kg)    Labs & Radiologic Studies    CBC No results for input(s): WBC, NEUTROABS, HGB, HCT, MCV, PLT in the last 72 hours. Basic Metabolic Panel Recent Labs    08/11/17 0203  NA 139  K 4.2  CL 108  CO2 24  GLUCOSE 96  BUN 17  CREATININE 0.90  CALCIUM 8.8*  MG 2.0   Liver Function Tests No results for input(s): AST, ALT, ALKPHOS, BILITOT, PROT, ALBUMIN in the last 72 hours. No results for input(s): LIPASE, AMYLASE in the last 72 hours. Cardiac Enzymes No results for input(s): CKTOTAL, CKMB, CKMBINDEX, TROPONINI in the last 72 hours. BNP Invalid input(s): POCBNP D-Dimer No results for input(s): DDIMER in the last 72 hours. Hemoglobin A1C No results for input(s): HGBA1C in the last 72 hours. Fasting Lipid Panel No results for input(s): CHOL, HDL, LDLCALC, TRIG, CHOLHDL, LDLDIRECT in the last 72 hours. Thyroid  Function Tests No results for input(s): TSH, T4TOTAL, T3FREE, THYROIDAB in the last 72 hours.  Invalid input(s): FREET3 _____________  Dg Chest 2 View  Result Date: 08/12/2017 CLINICAL DATA:  Post pacemaker EXAM: CHEST - 2 VIEW COMPARISON:  07/11/2015 FINDINGS: Left pacer is in place with leads in the right atrium and either just into the right ventricle or in the right atrial appendage. No pneumothorax. Heart is normal size. No confluent airspace opacities or effusions. No acute bony abnormality. IMPRESSION: Left pacer placement as above.  No pneumothorax. No active disease. Electronically Signed   By: Rolm Baptise M.D.   On: 08/12/2017 09:35   Disposition   Pt is being discharged home today in good condition.  Follow-up Plans & Appointments    Follow-up Information    [facility] Follow up on 08/26/2017.   Specialty:  Cardiology Why:  at 3:30PM for wound check  Contact information: [address], [address] [place]       [name], MD Follow up on 11/10/2017.   Specialty:  Cardiology Why:  at 11:45AM Contact information: [address] [place] [address] [phone]        Martinique, Peter M, MD Follow up on 09/03/2017.   Specialty:  Cardiology Why:  1:40 PM  Contact information: [address] [place] [address] [phone]          Discharge Instructions    Amb Referral to Cardiac Rehabilitation   Complete by:  As directed    Diagnosis:  Coronary Stents      Discharge Medications   Allergies as of 08/12/2017      Reactions   Methotrexate Derivatives Itching, Swelling   Plaquenil [hydroxychloroquine Sulfate] Other (See Comments)   Unknown reaction   Remicade [infliximab] Other (See Comments)   Unknown reaction   Ridaura [auranofin] Other (See Comments)   Does not remember   Sudafed [pseudoephedrine] Other (See Comments)   nervous      Medication List    TAKE these medications   ALPRAZolam 0.25 MG tablet Commonly known as:  XANAX Take 1 tablet (0.25 mg total) by mouth as needed for anxiety.   aspirin 81 MG tablet Take 81 mg by mouth every other day.   atorvastatin 80 MG tablet Commonly known as:  LIPITOR Take 1 tablet (80 mg total) by mouth daily at 6 PM.   cetirizine 10 MG tablet Commonly known as:  ZYRTEC Take 10 mg by mouth daily as needed for allergies.   cholecalciferol 1000 units tablet Commonly known as:  VITAMIN D Take 1,000 Units by mouth daily.   clopidogrel 75 MG tablet Commonly known as:  PLAVIX Take 1 tablet (75 mg total) by mouth daily with breakfast.   escitalopram 10 MG tablet Commonly known as:  LEXAPRO Take 10 mg by mouth daily.   fluticasone 50 MCG/ACT nasal spray Commonly known as:   FLONASE Place 1-2 sprays into both nostrils daily as needed for allergies or rhinitis.   ORENCIA 250 MG injection Generic drug:  abatacept Inject 250 mg into the vein every 30 (thirty) days.   REFRESH OP Place 1-2 drops into both eyes daily as needed (for dry eyes).         Outstanding Labs/Studies   None   Duration of Discharge Encounter   Greater than 30 minutes including physician time.  Signed, [place], PA-C 08/12/2017, 12:30 PM

## 2017-08-12 NOTE — Discharge Instructions (Addendum)
Coronary Angiogram With Stent, Care After This sheet gives you information about how to care for yourself after your procedure. Your health care provider may also give you more specific instructions. If you have problems or questions, contact your health care provider. What can I expect after the procedure? After your procedure, it is common to have:  Bruising in the area where a small, thin tube (catheter) was inserted. This usually fades within 1-2 weeks.  Blood collecting in the tissue (hematoma) that may be painful to the touch. It should usually decrease in size and tenderness within 1-2 weeks.  Follow these instructions at home: Insertion area care  Do not take baths, swim, or use a hot tub until your health care provider approves.  You may shower 24-48 hours after the procedure or as directed by your health care provider.  Follow instructions from your health care provider about how to take care of your incision. Make sure you: ? Wash your hands with soap and water before you change your bandage (dressing). If soap and water are not available, use hand sanitizer. ? Change your dressing as told by your health care provider. ? Leave stitches (sutures), skin glue, or adhesive strips in place. These skin closures may need to stay in place for 2 weeks or longer. If adhesive strip edges start to loosen and curl up, you may trim the loose edges. Do not remove adhesive strips completely unless your health care provider tells you to do that.  Remove the bandage (dressing) and gently wash the catheter insertion site with plain soap and water.  Pat the area dry with a clean towel. Do not rub the area, because that may cause bleeding.  Do not apply powder or lotion to the incision area.  Check your incision area every day for signs of infection. Check for: ? More redness, swelling, or pain. ? More fluid or blood. ? Warmth. ? Pus or a bad smell. Activity  Do not drive for 24 hours if  you were given a medicine to help you relax (sedative).  Do not lift anything that is heavier than 10 lb (4.5 kg) for 5 days after your procedure or as directed by your health care provider.  Ask your health care provider when it is okay for you: ? To return to work or school. ? To resume usual physical activities or sports. ? To resume sexual activity. Eating and drinking  Eat a heart-healthy diet. This should include plenty of fresh fruits and vegetables.  Avoid the following types of food: ? Food that is high in salt. ? Canned or highly processed food. ? Food that is high in saturated fat or sugar. ? [employer].  Limit alcohol intake to no more than 1 drink a day for non-pregnant women and 2 drinks a day for men. One drink equals 12 oz of beer, 5 oz of wine, or 1 oz of hard liquor. Lifestyle  Do not use any products that contain nicotine or tobacco, such as cigarettes and e-cigarettes. If you need help quitting, ask your health care provider.  Take steps to manage and control your weight.  Get regular exercise.  Manage your blood pressure.  Manage other health problems, such as diabetes. General instructions  Take over-the-counter and prescription medicines only as told by your health care provider. Blood thinners may be prescribed after your procedure to improve blood flow through the stent.  If you need an MRI after your heart stent has been placed,  be sure to tell the health care provider who orders the MRI that you have a heart stent.  Keep all follow-up visits as directed by your health care provider. This is important. Contact a health care provider if:  You have a fever.  You have chills.  You have increased bleeding from the catheter insertion area. Hold pressure on the area. Get help right away if:  You develop chest pain or shortness of breath.  You feel faint or you pass out.  You have unusual pain at the catheter insertion area.  You have redness,  warmth, or swelling at the catheter insertion area.  You have drainage (other than a small amount of blood on the dressing) from the catheter insertion area.  The catheter insertion area is bleeding, and the bleeding does not stop after 30 minutes of holding steady pressure on the area.  You develop bleeding from any other place, such as from your rectum. There may be bright red blood in your urine or stool, or it may appear as black, tarry stool. This information is not intended to replace advice given to you by your health care provider. Make sure you discuss any questions you have with your health care provider. Document Released: 10/19/2004 Document Revised: 12/28/2015 Document Reviewed: 12/28/2015 Elsevier Interactive Patient Education  2018 [place] Discharge Instructions for  Pacemaker/Defibrillator Patients  Activity No heavy lifting or vigorous activity with your left/right arm for 6 to 8 weeks.  Do not raise your left/right arm above your head for one week.  Gradually raise your affected arm as drawn below.            __          08/15/17                    08/16/17                    08/17/17                       08/18/17  NO DRIVING for   1 week  ; you may begin driving on  12/16/77   .  WOUND CARE - Keep the wound area clean and dry.  Do not get this area wet for one week. No showers for one week; you may shower on  08/18/17   . - The tape/steri-strips on your wound will fall off; do not pull them off.  No bandage is needed on the site.  DO  NOT apply any creams, oils, or ointments to the wound area. - If you notice any drainage or discharge from the wound, any swelling or bruising at the site, or you develop a fever > 101? F after you are discharged home, call the office at once.  Special Instructions - You are still able to use cellular telephones; use the ear opposite the side where you have your pacemaker/defibrillator.  Avoid carrying your cellular phone  near your device. - When traveling through airports, show security personnel your identification card to avoid being screened in the metal detectors.  Ask the security personnel to use the hand wand. - Avoid arc welding equipment, TENS units (transcutaneous nerve stimulators).  Call the office for questions about other devices. - Avoid electrical appliances that are in poor condition or are not properly grounded. - Microwave ovens are safe to be near or to operate.

## 2017-08-12 NOTE — Progress Notes (Signed)
Staff here assisting family with app.   [place]  Hahle, RN

## 2017-08-12 NOTE — Progress Notes (Signed)
CARDIAC REHAB PHASE I   PRE:  Rate/Rhythm: 60 SR  BP:  Supine:   Sitting: 130/70  Standing:    SaO2: 97%RA  MODE:  Ambulation: 300 ft   POST:  Rate/Rhythm: 72 SR  BP:  Supine:   Sitting: 147/50  Standing:    SaO2: 97%RA 1040-1118 Pt walked 300 ft on RA with hand held asst. C/o feeling a little SOB but pt had not walked outside of room since Saturday morning. Encouraged her to walk with assistance until stronger. She also stated she had taken some xanax to help her sleep and felt she was still a little groggy. Gave ex ed with pt and daughter which is walking with assistance. Back to bed after walk. Family in room. Sats good on RA.   Graylon Good, RN BSN  08/12/2017 11:11 AM

## 2017-08-12 NOTE — Progress Notes (Signed)
Completed education on d/c instructions with pt and family.   [place]  Hahle, RN

## 2017-08-12 NOTE — Progress Notes (Addendum)
Electrophysiology Rounding Note  Patient Name: Elizabeth Estrada Date of Encounter: 08/12/2017    Subjective   The patient is doing well today.  At this time, the patient denies chest pain, shortness of breath, or any new concerns.  Inpatient Medications    Scheduled Meds: . aspirin  81 mg Oral Daily  . atorvastatin  80 mg Oral q1800  . Chlorhexidine Gluconate Cloth  6 each Topical Daily  . cholecalciferol  1,000 Units Oral Daily  . clopidogrel  75 mg Oral Q breakfast  . enoxaparin (LOVENOX) injection  40 mg Subcutaneous Q24H  . escitalopram  10 mg Oral Daily  . mupirocin ointment  1 application Nasal BID  . sodium chloride flush  3 mL Intravenous Q12H   Continuous Infusions: . sodium chloride     PRN Meds: sodium chloride, acetaminophen, acetaminophen, ALPRAZolam, loratadine, ondansetron (ZOFRAN) IV, ondansetron (ZOFRAN) IV, oxyCODONE, sodium chloride flush   Vital Signs    Vitals:   08/11/17 1952 08/12/17 0000 08/12/17 0320 08/12/17 0802  BP: 121/65 (!) 141/72 (!) 127/56 125/60  Pulse: 62  69   Resp: 12 13 (!) 9 14  Temp: 98.7 F (37.1 C) 98.4 F (36.9 C) 98.6 F (37 C) 98.4 F (36.9 C)  TempSrc: Oral Oral Oral Oral  SpO2: 97% 99% 99% 95%  Weight:      Height:        Intake/Output Summary (Last 24 hours) at 08/12/2017 0839 Last data filed at 08/12/2017 2542 Gross per 24 hour  Intake 170 ml  Output 400 ml  Net -230 ml   Filed Weights   08/08/17 0819 08/09/17 0345 08/09/17 1100  Weight: 122 lb (55.3 kg) 135 lb 2.3 oz (61.3 kg) 121 lb 14.4 oz (55.3 kg)    Physical Exam    GEN- The patient is well appearing, alert and oriented x 3 today.   Head- normocephalic, atraumatic Eyes-  Sclera clear, conjunctiva pink Ears- hearing intact Oropharynx- clear Neck- supple Lungs- Clear to ausculation bilaterally, normal work of breathing Heart- Regular rate and rhythm (paced) GI- soft, NT, ND, + BS Extremities- no clubbing, cyanosis, or edema Skin- no rash or  lesion Psych- euthymic mood, full affect Neuro- strength and sensation are intact  Labs    CBC No results for input(s): WBC, NEUTROABS, HGB, HCT, MCV, PLT in the last 72 hours. Basic Metabolic Panel Recent Labs    08/11/17 0203  NA 139  K 4.2  CL 108  CO2 24  GLUCOSE 96  BUN 17  CREATININE 0.90  CALCIUM 8.8*  MG 2.0     Telemetry    SR w V pacing (personally reviewed)  Radiology    No results found.   Assessment & Plan    1.  Complete heart block Doing well s/p PPM Device function normal CXR with leads in stable position Routine wound care and follow up (entered in AVS)  2.  CAD No ischemic symptoms Right arm with persistent hematoma Per primary team  Ok from EP standpoint to discharge   Electrophysiology team to see as needed while here. Please call with questions.   Signed, Chanetta Marshall, NP  08/12/2017, 8:39 AM   I have seen and examined this patient with Chanetta Marshall.  Agree with above, note added to reflect my findings.  On exam, RRR, no murmurs, lungs clear.  Dual-chamber pacemaker implanted for complete heart block.  The  device functioning appropriately.  Chest x-ray without major abnormality.  Okay to discharge from  from an EP perspective.  Will M. Camnitz MD 08/12/2017 10:52 AM

## 2017-08-12 NOTE — Progress Notes (Signed)
Pt states someone told her to set up a pacemaker app on her phone before d/c. Looking into this to clarify and set up if necessary.   [place]  Hahle, RN

## 2017-08-13 ENCOUNTER — Telehealth: Payer: Self-pay | Admitting: Cardiology

## 2017-08-13 ENCOUNTER — Encounter: Payer: Self-pay | Admitting: Cardiology

## 2017-08-13 ENCOUNTER — Emergency Department (HOSPITAL_COMMUNITY)
Admission: EM | Admit: 2017-08-13 | Discharge: 2017-08-13 | Disposition: A | Discharge status: Home / Self Care | Payer: Medicare Other | Attending: Emergency Medicine | Admitting: Emergency Medicine

## 2017-08-13 ENCOUNTER — Other Ambulatory Visit: Payer: Self-pay

## 2017-08-13 DIAGNOSIS — Z955 Presence of coronary angioplasty implant and graft: Secondary | ICD-10-CM | POA: Insufficient documentation

## 2017-08-13 DIAGNOSIS — T8089XA Other complications following infusion, transfusion and therapeutic injection, initial encounter: Secondary | ICD-10-CM | POA: Diagnosis not present

## 2017-08-13 DIAGNOSIS — I2510 Atherosclerotic heart disease of native coronary artery without angina pectoris: Secondary | ICD-10-CM | POA: Diagnosis not present

## 2017-08-13 DIAGNOSIS — Z85820 Personal history of malignant melanoma of skin: Secondary | ICD-10-CM | POA: Diagnosis not present

## 2017-08-13 DIAGNOSIS — I10 Essential (primary) hypertension: Secondary | ICD-10-CM | POA: Diagnosis not present

## 2017-08-13 DIAGNOSIS — Z8679 Personal history of other diseases of the circulatory system: Secondary | ICD-10-CM | POA: Diagnosis not present

## 2017-08-13 DIAGNOSIS — I4430 Unspecified atrioventricular block: Secondary | ICD-10-CM | POA: Insufficient documentation

## 2017-08-13 DIAGNOSIS — Z7982 Long term (current) use of aspirin: Secondary | ICD-10-CM | POA: Diagnosis not present

## 2017-08-13 DIAGNOSIS — Z950 Presence of cardiac pacemaker: Secondary | ICD-10-CM | POA: Insufficient documentation

## 2017-08-13 DIAGNOSIS — Z7902 Long term (current) use of antithrombotics/antiplatelets: Secondary | ICD-10-CM | POA: Insufficient documentation

## 2017-08-13 DIAGNOSIS — Z79899 Other long term (current) drug therapy: Secondary | ICD-10-CM | POA: Insufficient documentation

## 2017-08-13 DIAGNOSIS — T148XXA Other injury of unspecified body region, initial encounter: Secondary | ICD-10-CM

## 2017-08-13 DIAGNOSIS — Y658 Other specified misadventures during surgical and medical care: Secondary | ICD-10-CM | POA: Insufficient documentation

## 2017-08-13 DIAGNOSIS — I97610 Postprocedural hemorrhage and hematoma of a circulatory system organ or structure following a cardiac catheterization: Secondary | ICD-10-CM | POA: Diagnosis not present

## 2017-08-13 LAB — COMPREHENSIVE METABOLIC PANEL
ALT: 15 U/L (ref 14–54)
AST: 34 U/L (ref 15–41)
Albumin: 3.4 g/dL — ABNORMAL LOW (ref 3.5–5.0)
Alkaline Phosphatase: 74 U/L (ref 38–126)
Anion gap: 7 (ref 5–15)
BUN: 18 mg/dL (ref 6–20)
CO2: 24 mmol/L (ref 22–32)
Calcium: 8.7 mg/dL — ABNORMAL LOW (ref 8.9–10.3)
Chloride: 107 mmol/L (ref 101–111)
Creatinine, Ser: 0.79 mg/dL (ref 0.44–1.00)
GFR calc Af Amer: >60 mL/min (ref >60)
GFR calc non Af Amer: >60 mL/min (ref >60)
Glucose, Bld: 100 mg/dL — ABNORMAL HIGH (ref 65–99)
Potassium: 4.3 mmol/L (ref 3.5–5.1)
Sodium: 138 mmol/L (ref 135–145)
Total Bilirubin: 1.2 mg/dL (ref 0.3–1.2)
Total Protein: 6.8 g/dL (ref 6.5–8.1)

## 2017-08-13 LAB — CBC WITH DIFFERENTIAL/PLATELET
Basophils Absolute: 0 10*3/uL (ref 0.0–0.1)
Basophils Relative: 0 %
Eosinophils Absolute: 0.1 10*3/uL (ref 0.0–0.7)
Eosinophils Relative: 2 %
HCT: 39.4 % (ref 36.0–46.0)
Hemoglobin: 12.5 g/dL (ref 12.0–15.0)
Lymphocytes Relative: 20 %
Lymphs Abs: 1.6 10*3/uL (ref 0.7–4.0)
MCH: 28.2 pg (ref 26.0–34.0)
MCHC: 31.7 g/dL (ref 30.0–36.0)
MCV: 88.7 fL (ref 78.0–100.0)
Monocytes Absolute: 0.6 10*3/uL (ref 0.1–1.0)
Monocytes Relative: 7 %
Neutro Abs: 5.7 10*3/uL (ref 1.7–7.7)
Neutrophils Relative %: 71 %
Platelets: 150 10*3/uL (ref 150–400)
RBC: 4.44 MIL/uL (ref 3.87–5.11)
RDW: 13.9 % (ref 11.5–15.5)
WBC: 8 10*3/uL (ref 4.0–10.5)

## 2017-08-13 LAB — APTT: aPTT: 29 seconds (ref 24–36)

## 2017-08-13 LAB — PROTIME-INR
INR: 1.03
Prothrombin Time: 13.4 seconds (ref 11.4–15.2)

## 2017-08-13 MED ORDER — ONDANSETRON 4 MG PO TBDP
4.0000 mg | ORAL_TABLET | Freq: Once | ORAL | Status: AC
  Administered 2017-08-13: 4 mg via ORAL
  Filled 2017-08-13: qty 1

## 2017-08-13 NOTE — Telephone Encounter (Signed)
Left message to call back 5/1

## 2017-08-13 NOTE — Telephone Encounter (Signed)
Returned call to [place] no answer.[place].

## 2017-08-13 NOTE — ED Notes (Signed)
Dr. Schlossman at bedside. 

## 2017-08-13 NOTE — Telephone Encounter (Signed)
Spoke to daughter, Elizabeth Estrada, regarding some bleeding that patient has been experiencing since hospital discharge. Daughter states that patient's Lovenox injection site has been bleeding on and off since she's been home. She said that she's dressed the area 4 times with a sponge and 4x4's and each time she has bled through the dressing. She states that she applies pressure x 15 mins each time, which normally stops the bleeding, but once she changes positions the bleeding will start again. Daughter denies any bleeding or edema of the ppm pocket.   Platelets 135 in hospital.  +ASA/Plavix (h/o CAD, RCA PCI DES on 08/08/17)-none yet today.  I spoke to [facility] about patient's c/o. Dr.Camnitz recommended that patient follow up with her PCP. Called daughter and informed her of Dr.Camnitz's recommendations. Daughter verbalized understanding.

## 2017-08-13 NOTE — Telephone Encounter (Signed)
New message  Pt sister verbalized that she is returning call for RN

## 2017-08-13 NOTE — Telephone Encounter (Signed)
New Message     1. Has your device fired? no  2. Is you device beeping? no  3. Are you experiencing draining or swelling at device site?no   4. Are you calling to see if we received your device transmission? No   5. Have you passed out? No   Patients daughter is calling to advise that the patient was given an injection in the stomach after implant. Now the patient is bleeding from the site of the injection. Please call to discuss.       Please route to [place]

## 2017-08-13 NOTE — ED Provider Notes (Signed)
Patient placed in Quick Look pathway, seen and evaluated   Chief Complaint:   HPI:  Patient  presenting with bleeding at lovenox injection site on the RLQ. Patient caregiver was having difficulty stopping the bleeding  Patient had presented with complete heart block. Cath with one stent placed on Friday and pacemaker on Monday. Pain around the pacemaker site with movement. She endorses some nausea, no vomiting. Declines anything for nausea at this time.  ROS: reports soreness at the pacemaker site, no c/p, sob, lightheadedness  Physical Exam:   Gen: No distress  Neuro: Awake and Alert  Skin: Warm    Focused Exam: bleeding controlled at this time. No surrounding erythema or warmth both at the injection site (RLQ) and pacemaker site. She is otherwise well-appearing, nontoxic and afebrile.    Initiation of care has begun. The patient has been counseled on the process, plan, and necessity for staying for the completion/evaluation, and the remainder of the medical screening examination    Dossie Der 08/13/17 [place], Wenda Overland, MD 08/13/17 [phone]

## 2017-08-13 NOTE — ED Provider Notes (Signed)
[facility]ERG[facility] DEPARTMENT Provider Note   CSN: [id number] Arrival date & time: 08/13/17  1416     History   Chief Complaint Chief Complaint  Patient presents with  . injection shot bleeding    HPI Elizabeth Estrada is a 77 y.o. female.  Patient presents to the emergency department with complaint of bleeding.  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  Since returning home yesterday patient has had bleeding from the site on her lower abdomen where she was given Lovenox injections.  Bleeding has occurred intermittently over the past 24 hours.  Family member has had a change 4 x 4 dressings 5 times.  Prior to coming to the emergency department, family member applied to Steri-Strips to close the wound.  Since that time, bleeding has stopped.  Patient has not had any lightheadedness or syncope.  She has not had any worsening shortness of breath or chest pain.  She is on Plavix daily and aspirin every other day.  They came to the emergency department because of concerns over the blood counts and the patient was told that her platelets were low. The onset of this condition was acute. The course is resolved. Aggravating factors: positions. Alleviating factors: none.       Past Medical History:  Diagnosis Date  . Anxiety   . AV block   . Chronic depression   . Coronary artery disease   . Heart murmur    NO PROBLEMS AS ADULT  . History of kidney stones   . Hypertension   . Kidney stone   . Melanoma ([place])    removed from back  . MVP (mitral valve prolapse)   . Osteoporosis   . RA (rheumatoid arthritis) ([place])   . Rheumatoid arthritis(714.0)    HANDS. KNEES  . Rib fracture     Patient Active Problem List   Diagnosis Date Noted  . Abnormal stress ECG with treadmill 08/09/2017  . CAD S/P percutaneous coronary angioplasty   . DOE (dyspnea on exertion) 07/14/2017  . PSVT (paroxysmal  supraventricular tachycardia) ([place]) 07/14/2017  . Family history of coronary artery disease in brother 07/14/2017  . Chest tightness 07/14/2017  . Cyst of right ovary 01/02/2015  . Kidney stones 01/02/2015  . Depression 03/17/2014  . Fibromyalgia 03/17/2014  . Rheumatoid arthritis ([place]) 03/17/2014  . Cephalalgia 03/17/2014  . Chest pain, atypical 05/17/2011  . Mitral stenosis-mild 05/17/2011    Past Surgical History:  Procedure Laterality Date  . BREAST SURGERY     LEFT BREAST BX- BENIGN  . CARDIAC CATHETERIZATION  08/08/2017  . CARDIOVASCULAR STRESS TEST  10/19/1999, 2013   EF 97%, NO EVIDENCE OF ISCHEMIA, 2013 NORMAL  . COLONOSCOPY    . CORONARY STENT INTERVENTION N/A 08/08/2017   Procedure: CORONARY STENT INTERVENTION;  Surgeon: [name], MD;  Location: [facility];  Service: Cardiovascular;  Laterality: N/A;  . CORONARY STENT PLACEMENT  08/08/2017  . DILATATION & CURRETTAGE/HYSTEROSCOPY WITH RESECTOCOPE N/A 07/06/2013   Procedure: DILATATION & CURETTAGE/HYSTEROSCOPY WITH RESECTOCOPE with removal of endometrial lesion;  Surgeon: [name], MD;  Location: [facility];  Service: Gynecology;  Laterality: N/A;  . EYE SURGERY     BILATERAL CATARACT  . melanoma removed     from back  . PACEMAKER IMPLANT N/A 08/11/2017   Procedure: PACEMAKER IMPLANT;  Surgeon: [name], MD;  Location: [facility];  Service:  Cardiovascular;  Laterality: N/A;  . REMOVAL OF CYST FROM HER LEFT BREAST Left 8/05   benign - Dr. Margot Chimes  . RIGHT/LEFT HEART CATH AND CORONARY ANGIOGRAPHY N/A 08/08/2017   Procedure: RIGHT/LEFT HEART CATH AND CORONARY ANGIOGRAPHY;  Surgeon: [name], MD;  Location: [facility];  Service: Cardiovascular;  Laterality: N/A;  . US ECHOCARDIOGRAPHY  06/04/2004   EF 60-65%     OB History    Gravida  0   Para  0   Term      Preterm      AB      Living        SAB      TAB      Ectopic      Multiple      Live Births                 Home Medications    Prior to Admission medications   Medication Sig Start Date End Date Taking? Authorizing Provider  abatacept (ORENCIA) 250 MG injection Inject 250 mg into the vein every 30 (thirty) days.    Yes [provider]  ALPRAZolam (XANAX) 0.25 MG tablet Take 1 tablet (0.25 mg total) by mouth as needed for anxiety. 08/07/17  Yes Erlene Quan, PA-C  aspirin 81 MG tablet Take 81 mg by mouth every other day.    Yes [provider]  atorvastatin (LIPITOR) 80 MG tablet Take 1 tablet (80 mg total) by mouth daily at 6 PM. 08/12/17  Yes Lyda Jester M, PA-C  cetirizine (ZYRTEC) 10 MG tablet Take 10 mg by mouth daily as needed for allergies.   Yes [provider]  cholecalciferol (VITAMIN D) 1000 UNITS tablet Take 1,000 Units by mouth daily.   Yes [provider]  clopidogrel (PLAVIX) 75 MG tablet Take 1 tablet (75 mg total) by mouth daily with breakfast. 08/12/17  Yes Lyda Jester M, PA-C  escitalopram (LEXAPRO) 10 MG tablet Take 10 mg by mouth at bedtime.    Yes [provider]  fluticasone (FLONASE) 50 MCG/ACT nasal spray Place 1-2 sprays into both nostrils daily as needed for allergies or rhinitis.   Yes [provider]  Polyvinyl Alcohol-Povidone (REFRESH OP) Place 1-2 drops into both eyes daily as needed (for dry eyes).   Yes [provider]    Family History Family History  Problem Relation Age of Onset  . Coronary artery disease Mother   . Pancreatic cancer Father   . Heart disease Father        bypass  . Hypertension Father   . CAD Brother 78       CABG and AVR    Social History Social History   Tobacco Use  . Smoking status: Never Smoker  . Smokeless tobacco: Never Used  Substance Use Topics  . Alcohol use: No  . Drug use: No     Allergies   Methotrexate derivatives; Plaquenil [hydroxychloroquine sulfate]; Remicade [infliximab]; Ridaura [auranofin]; and Sudafed  [pseudoephedrine]   Review of Systems Review of Systems  Constitutional: Negative for fever.  HENT: Negative for rhinorrhea and sore throat.   Eyes: Negative for redness.  Respiratory: Positive for shortness of breath (baseline). Negative for cough.   Cardiovascular: Negative for chest pain.  Gastrointestinal: Negative for abdominal pain, blood in stool, diarrhea, nausea and vomiting.  Genitourinary: Negative for dysuria.  Musculoskeletal: Negative for myalgias.  Skin: Positive for wound. Negative for rash.  Neurological: Negative for headaches.  Hematological:  Bruises/bleeds easily.     Physical Exam Updated Vital Signs BP (!) 144/97   Pulse 75   Temp 98.5 F (36.9 C) (Oral)   Resp 14   LMP 04/15/1990   SpO2 98%   Physical Exam  Constitutional: She appears well-developed and well-nourished.  HENT:  Head: Normocephalic and atraumatic.  Mouth/Throat: Oropharynx is clear and moist.  Eyes: Conjunctivae are normal. Right eye exhibits no discharge. Left eye exhibits no discharge.  Neck: Normal range of motion. Neck supple.  Cardiovascular: Normal rate, regular rhythm and normal heart sounds.  No murmur heard. Pulmonary/Chest: Effort normal and breath sounds normal. No respiratory distress. She has no wheezes. She has no rales.  Abdominal: Soft. There is no tenderness.  There is a small puncture wound on the lower abdomen with surrounding ecchymosis.  No active bleeding.  Two Steri-Strips intact.  Neurological: She is alert.  Skin: Skin is warm and dry.  Right forearm ecchymosis associated with catheterization puncture.  Forearm is soft and nontender.  Bruising appears old.  Psychiatric: She has a normal mood and affect.  Nursing note and vitals reviewed.    ED Treatments / Results  Labs (all labs ordered are listed, but only abnormal results are displayed) Labs Reviewed  COMPREHENSIVE METABOLIC PANEL - Abnormal; Notable for the following components:      Result Value     Glucose, Bld 100 (*)    Calcium 8.7 (*)    Albumin 3.4 (*)    All other components within normal limits  PROTIME-INR  APTT  CBC WITH DIFFERENTIAL/PLATELET    EKG None  Radiology Dg Chest 2 View  Result Date: 08/12/2017 CLINICAL DATA:  Post pacemaker EXAM: CHEST - 2 VIEW COMPARISON:  07/11/2015 FINDINGS: Left pacer is in place with leads in the right atrium and either just into the right ventricle or in the right atrial appendage. No pneumothorax. Heart is normal size. No confluent airspace opacities or effusions. No acute bony abnormality. IMPRESSION: Left pacer placement as above.  No pneumothorax. No active disease. Electronically Signed   By: Rolm Baptise M.D.   On: 08/12/2017 09:35    Procedures Procedures (including critical care time)  Medications Ordered in ED Medications  ondansetron (ZOFRAN-ODT) disintegrating tablet 4 mg (4 mg Oral Given 08/13/17 1549)     Initial Impression / Assessment and Plan / ED Course  I have reviewed the triage vital signs and the nursing notes.  Pertinent labs & imaging results that were available during my care of the patient were reviewed by me and considered in my medical decision making (see chart for details).     Patient seen and examined. Work-up reviewed.  Discussed with Dr. Billy Fischer who will see.  Vital signs reviewed and are as follows: BP (!) 144/97   Pulse 75   Temp 98.5 F (36.9 C) (Oral)   Resp 14   LMP 04/15/1990   SpO2 98%   6:10 PM labs reviewed with patient and family.  Wound redressed by myself.  She will be discharged home.  Instructed to keep dressing in place for the next day, remove bandage tomorrow.  Encouraged return to the emergency department with persisting or uncontrolled bleeding.  They will follow-up with their cardiologist, electrophysiology, and primary care doctor as planned.  Final Clinical Impressions(s) / ED Diagnoses   Final diagnoses:  Bleeding from wound   Patient with bleeding from  Lovenox injection site.  This has since become controlled.  Patient is not anemic and has normal  vital signs, INR, and platelet count.  Suspect bleeding exacerbated by use of Plavix and aspirin, however would not alter this regimen at this time.  Patient appears well, ready for discharge.  ED Discharge Orders    None       Carlisle Cater, PA-C 08/13/17 1811    Gareth Morgan, MD 08/16/17 [phone]

## 2017-08-13 NOTE — Telephone Encounter (Signed)
Returned call to [place] no answer.[place].

## 2017-08-13 NOTE — Telephone Encounter (Signed)
New Message   Patients caregiver Blanch Media is calling in reference to patient. She states that the patient is currently at the emergency room because she is losing blood from the site where she received an injection (stomach). She is upset that the patient is having to sit in the emergency room and she would like for Dr. [place] to do something sooner. Please call to discuss.

## 2017-08-13 NOTE — Discharge Instructions (Signed)
Please read and follow all provided instructions.  Your diagnoses today include:  1. Bleeding from wound     Tests performed today include:  Blood counts and electrolytes -no signs of severe blood loss, platelets are low normal range  Coagulation tests -normal  Vital signs. See below for your results today.   Medications prescribed:   None  Take any prescribed medications only as directed.  Home care instructions:  Follow any educational materials contained in this packet.  BE VERY CAREFUL not to take multiple medicines containing Tylenol (also called acetaminophen). Doing so can lead to an overdose which can damage your liver and cause liver failure and possibly death.   Follow-up instructions: Please follow-up with your primary care provider in the next 3 days for further evaluation of your symptoms as needed.   Return instructions:   Please return to the Emergency Department if you experience worsening symptoms.   Return with uncontrolled bleeding.  Please return if you have any other emergent concerns.  Additional Information:  Your vital signs today were: BP (!) 144/97    Pulse 75    Temp 98.5 F (36.9 C) (Oral)    Resp 14    LMP 04/15/1990    SpO2 98%  If your blood pressure (BP) was elevated above 135/85 this visit, please have this repeated by your doctor within one month. --------------

## 2017-08-13 NOTE — ED Triage Notes (Addendum)
Pt had pacemaker placement to left and right sided stent placement this week. Got a lovenox injection to abdomen upon DC. Daughter has been changing the dressing to site, since, dressings have been saturated with blood. Pt taking Lovenox, Plavix and asprin. Pt daughter placed steri strip over lovenox injection site and she says this is working. DC'd yesterday. Site is not longer bleeding when assessed in triage. Have to use leg for BPs. Some pain to pacemaker placement site.

## 2017-08-13 NOTE — ED Notes (Signed)
This RN applied dressing with Josh PA. Dressing applied to injection site that was previously bleeding. No bleeding at this time.

## 2017-08-13 NOTE — Telephone Encounter (Signed)
Spoke with Blanch Media and she would like to speak with Malachy Mood, will forward

## 2017-08-13 NOTE — ED Notes (Signed)
Josh PA at bedside   

## 2017-08-14 NOTE — Telephone Encounter (Signed)
Returned call to [place] no answer.[place].

## 2017-08-14 NOTE — Telephone Encounter (Signed)
Called patient no answer.LMTC. 

## 2017-08-14 NOTE — Telephone Encounter (Signed)
Received a call from patient.She stated she is doing better.Stated she was having bleeding from lovenox injection site yesterday.Stated when she got to [facility] ED bleeding stopped.No bleeding today.Advised to keep appointment with Dr.Jordan as planned.

## 2017-08-15 ENCOUNTER — Telehealth: Payer: Self-pay | Admitting: Cardiology

## 2017-08-15 NOTE — Telephone Encounter (Signed)
New Message   Pt's daughter is calling with questions about pt's stent and pace maker up keep instructions. Please call

## 2017-08-15 NOTE — Telephone Encounter (Signed)
Spoke with patient's daughter per DPR. Patient is S/P cath 4/26 and PPM 4/29. Daughter asked if she could remove the gauze from the radial cath site as well as the [facility] IV site. I confirmed that it was safe to remove the gauze from both of those sites. She additionally asked if she could remove the gauze and Tegaderm from the PPM site. I confirmed that she could remove this but to leave the steri strips in place until the wound check appt. Daughter verbalized understand

## 2017-08-26 ENCOUNTER — Ambulatory Visit (INDEPENDENT_AMBULATORY_CARE_PROVIDER_SITE_OTHER): Payer: Medicare Other | Admitting: *Deleted

## 2017-08-26 DIAGNOSIS — Z950 Presence of cardiac pacemaker: Secondary | ICD-10-CM | POA: Diagnosis not present

## 2017-08-26 DIAGNOSIS — I442 Atrioventricular block, complete: Secondary | ICD-10-CM | POA: Diagnosis not present

## 2017-08-26 LAB — CUP PACEART INCLINIC DEVICE CHECK
Battery Remaining Longevity: 51 mo
Battery Voltage: 3.17 V
Brady Statistic AP VP Percent: 35.34 %
Brady Statistic AP VS Percent: 0.01 %
Brady Statistic AS VP Percent: 64.59 %
Brady Statistic AS VS Percent: 0.06 %
Brady Statistic RA Percent Paced: 35.3 %
Brady Statistic RV Percent Paced: 99.93 %
Date Time Interrogation Session: 20190514174536
Implantable Lead Implant Date: 20190429
Implantable Lead Implant Date: 20190429
Implantable Lead Location: 753859
Implantable Lead Location: 753860
Implantable Lead Model: 3830
Implantable Lead Model: 5076
Implantable Pulse Generator Implant Date: 20190429
Lead Channel Impedance Value: 323 Ohm
Lead Channel Impedance Value: 342 Ohm
Lead Channel Impedance Value: 513 Ohm
Lead Channel Impedance Value: 532 Ohm
Lead Channel Pacing Threshold Amplitude: 1 V
Lead Channel Pacing Threshold Amplitude: 3 V
Lead Channel Pacing Threshold Amplitude: 3.75 V
Lead Channel Pacing Threshold Pulse Width: 0.4 ms
Lead Channel Pacing Threshold Pulse Width: 1 ms
Lead Channel Pacing Threshold Pulse Width: 1 ms
Lead Channel Sensing Intrinsic Amplitude: 3 mV
Lead Channel Sensing Intrinsic Amplitude: 3 mV
Lead Channel Setting Pacing Amplitude: 3.5 V
Lead Channel Setting Pacing Amplitude: 5 V
Lead Channel Setting Pacing Pulse Width: 1 ms
Lead Channel Setting Sensing Sensitivity: 0.9 mV

## 2017-08-26 NOTE — Progress Notes (Signed)
Wound check appointment. Steri-strips removed. Wound without redness or edema. Incision edges approximated, wound well healed. Normal device function. RA thresholds, sensing, and impedances consistent with implant measurements. RV/His sensing and impedance stable, threshold elevated at 3.0V @ 1.83ms unipolar, 3.75V @ 1.75ms bipolar. Presenting EKG appears ? intermittent fusion and pseudofusion (conduction intact today), threshold test shows non-selective capture from 5.0V until LOC. Device programmed with RA output at 3.5V with auto capture programmed on, RV output maintained at 5.0V @ 1.24ms per [facility] for extra safety margin until 3 month visit. Histogram distribution appropriate for patient and level of activity. No mode switches or high ventricular rates noted. Patient educated about wound care, arm mobility, lifting restrictions, and Carelink monitor. ROV with AS on 09/22/17 and with WC on 11/10/17.

## 2017-08-27 ENCOUNTER — Telehealth: Payer: Self-pay | Admitting: *Deleted

## 2017-08-27 DIAGNOSIS — Z950 Presence of cardiac pacemaker: Secondary | ICD-10-CM

## 2017-08-27 DIAGNOSIS — I442 Atrioventricular block, complete: Secondary | ICD-10-CM

## 2017-08-27 NOTE — Telephone Encounter (Signed)
Patient returned call.  She is agreeable to CXR tomorrow at [place] [facility].  Order entered electronically.  Patient is aware of address and walk-in hours.  She is appreciative of call and denies additional questions or concerns at this time.

## 2017-08-27 NOTE — Telephone Encounter (Signed)
LMOVM requesting call back to the [facility].  Gave direct number.  Per Dr. Curt Bears, plan to obtain CXR to assess RV lead placement.

## 2017-08-28 ENCOUNTER — Ambulatory Visit
Admission: RE | Admit: 2017-08-28 | Discharge: 2017-08-28 | Disposition: A | Discharge status: Home / Self Care | Payer: Medicare Other | Source: Ambulatory Visit | Attending: Cardiology | Admitting: Cardiology

## 2017-08-28 DIAGNOSIS — I442 Atrioventricular block, complete: Secondary | ICD-10-CM

## 2017-08-28 DIAGNOSIS — J948 Other specified pleural conditions: Secondary | ICD-10-CM | POA: Diagnosis not present

## 2017-08-28 DIAGNOSIS — Z950 Presence of cardiac pacemaker: Secondary | ICD-10-CM

## 2017-09-01 NOTE — Progress Notes (Signed)
Cardiology Office Note   Date:  09/03/2017   ID:  Elizabeth, Estrada 07/26/1940, MRN [id number]  PCP:  [name], MD  Cardiologist:   [name], MD   Chief Complaint  Patient presents with   Shortness of Breath    states a little      History of Present Illness: Elizabeth Estrada is a 77 y.o. female who is seen for follow up CAD and complete heart block.   She has a history of MVP and family history of CAD. She has a history of murmur. Echo in 2006 mentions mild prolapse but in 2013 she had an Echo showing a small pericardial effusion otherwise normal. No prolapse seen. She also had a normal stress Echo at that time. She was seen in January 2019 with some palpitations. Seen again in April with symptoms of exertional chest pressure. Echo showed mild to moderate mitral stenosis. ETT was done and demonstrated very poor exercise tolerance with hypertensive BP response. There was 2:1 AV block noted. An event monitor showed episodes of complete heart block. She underwent right and left heart cath. This showed an 80% ostial RCA stenosis and 70% diagonal. There was mild pulmonary HTN without significant mitral stenosis. EDP was elevated c/w diastolic dysfunction. She underwent stenting of the ostial RCA with DES. She was placed on DAPT. Post stent she had persistent episodes of CHB despite correcting ischemia and underwent DDD pacemaker placement on 08/11/17. Post DC she was seen in the ED on 5/1 with some bleeding from a Lovenox injection site that was resolved. Seen in the device clinic on 5/14. RV threshold was elevated. CXR showed no change in lead position.  On follow up she is seen with her friend and her daughter is listening in by phone. She is doing well. Denies any chest pain. No edema. Energy level is good. Only a little SOB on occasion. Wants to do more. She did have extensive bruising in arm from cardiac cath but this has resolved.      Past Medical History:  Diagnosis  Date   Anxiety    AV block    Chronic depression    Coronary artery disease    Heart murmur    NO PROBLEMS AS ADULT   History of kidney stones    Hypertension    Kidney stone    Melanoma ([place])    removed from back   MVP (mitral valve prolapse)    Osteoporosis    RA (rheumatoid arthritis) ([place])    Rheumatoid arthritis(714.0)    HANDS. KNEES   Rib fracture     Past Surgical History:  Procedure Laterality Date   BREAST SURGERY     LEFT BREAST BX- BENIGN   CARDIAC CATHETERIZATION  08/08/2017   CARDIOVASCULAR STRESS TEST  10/19/1999, 2013   EF 97%, NO EVIDENCE OF ISCHEMIA, 2013 NORMAL   COLONOSCOPY     CORONARY STENT INTERVENTION N/A 08/08/2017   Procedure: CORONARY STENT INTERVENTION;  Surgeon: [name], MD;  Location: [facility];  Service: Cardiovascular;  Laterality: N/A;   CORONARY STENT PLACEMENT  08/08/2017   DILATATION & CURRETTAGE/HYSTEROSCOPY WITH RESECTOCOPE N/A 07/06/2013   Procedure: DILATATION & CURETTAGE/HYSTEROSCOPY WITH RESECTOCOPE with removal of endometrial lesion;  Surgeon: [name], MD;  Location: [facility];  Service: Gynecology;  Laterality: N/A;   EYE SURGERY     BILATERAL CATARACT   melanoma removed     from back   PACEMAKER IMPLANT N/A 08/11/2017  Procedure: PACEMAKER IMPLANT;  Surgeon: [name], MD;  Location: [facility];  Service: Cardiovascular;  Laterality: N/A;   REMOVAL OF CYST FROM HER LEFT BREAST Left 8/05   benign - Dr. Margot Chimes   RIGHT/LEFT HEART CATH AND CORONARY ANGIOGRAPHY N/A 08/08/2017   Procedure: RIGHT/LEFT HEART CATH AND CORONARY ANGIOGRAPHY;  Surgeon: [name], MD;  Location: [facility];  Service: Cardiovascular;  Laterality: N/A;   US ECHOCARDIOGRAPHY  06/04/2004   EF 60-65%     Current Outpatient Medications  Medication Sig Dispense Refill   abatacept (ORENCIA) 250 MG injection Inject 250 mg into the vein every 30 (thirty) days.      aspirin 81 MG  tablet Take 81 mg by mouth every other day.      atorvastatin (LIPITOR) 80 MG tablet Take 1 tablet (80 mg total) by mouth daily at 6 PM. 30 tablet 5   cetirizine (ZYRTEC) 10 MG tablet Take 10 mg by mouth daily as needed for allergies.     cholecalciferol (VITAMIN D) 1000 UNITS tablet Take 1,000 Units by mouth daily.     clopidogrel (PLAVIX) 75 MG tablet Take 1 tablet (75 mg total) by mouth daily with breakfast. 30 tablet 11   escitalopram (LEXAPRO) 10 MG tablet Take 10 mg by mouth at bedtime.      fluticasone (FLONASE) 50 MCG/ACT nasal spray Place 1-2 sprays into both nostrils daily as needed for allergies or rhinitis.     Polyvinyl Alcohol-Povidone (REFRESH OP) Place 1-2 drops into both eyes daily as needed (for dry eyes).     No current facility-administered medications for this visit.     Allergies:   Methotrexate derivatives; Plaquenil [hydroxychloroquine sulfate]; Remicade [infliximab]; Ridaura [auranofin]; and Sudafed [pseudoephedrine]    Social History:  The patient  reports that she has never smoked. She has never used smokeless tobacco. She reports that she does not drink alcohol or use drugs.   Family History:  The patient's family history includes CAD (age of onset: 73) in her brother; Coronary artery disease in her mother; Heart disease in her father; Hypertension in her father; Pancreatic cancer in her father.    ROS:  Please see the history of present illness.  . Otherwise, review of systems are positive for none.   All other systems are reviewed and negative.    PHYSICAL EXAM: VS:  BP (!) 145/71    Pulse 69    Ht 5' 2.5" (1.588 m)    Wt 118 lb 9.6 oz (53.8 kg)    LMP 04/15/1990    SpO2 100%    BMI 21.35 kg/m  , BMI Body mass index is 21.35 kg/m. GENERAL:  Well appearing WF in NAD HEENT:  PERRL, EOMI, sclera are clear. Oropharynx is clear. NECK:  No jugular venous distention, carotid upstroke brisk and symmetric, no bruits, no thyromegaly or adenopathy LUNGS:  Clear  to auscultation bilaterally CHEST:  Unremarkable HEART:  RRR,  PMI not displaced or sustained,S1 and S2 within normal limits, no S3, no S4: no clicks, no rubs, no murmurs ABD:  Soft, nontender. BS +, no masses or bruits. No hepatomegaly, no splenomegaly EXT:  2 + pulses throughout, no edema, no cyanosis no clubbing. Right wrist with small knot at cath site. No bruising or hematoma. Pulse 2+. SKIN:  Warm and dry.  No rashes NEURO:  Alert and oriented x 3. Cranial nerves II through XII intact. PSYCH:  Cognitively intact     EKG:  EKG  is not ordered today.    Recent Labs: Lab Results  Component Value Date   WBC 8.0 08/13/2017   HGB 12.5 08/13/2017   HCT 39.4 08/13/2017   PLT 150 08/13/2017   GLUCOSE 100 (H) 08/13/2017   CHOL 227 (H) 07/14/2017   TRIG 43 07/14/2017   HDL 91 07/14/2017   LDLCALC 127 (H) 07/14/2017   ALT 15 08/13/2017   AST 34 08/13/2017   NA 138 08/13/2017   K 4.3 08/13/2017   CL 107 08/13/2017   CREATININE 0.79 08/13/2017   BUN 18 08/13/2017   CO2 24 08/13/2017   TSH 1.610 07/14/2017   INR 1.03 08/13/2017     Lipid Panel    Component Value Date/Time   CHOL 227 (H) 07/14/2017 1214   TRIG 43 07/14/2017 1214   HDL 91 07/14/2017 1214   CHOLHDL 2.5 07/14/2017 1214   LDLCALC 127 (H) 07/14/2017 1214      Wt Readings from Last 3 Encounters:  09/03/17 118 lb 9.6 oz (53.8 kg)  08/09/17 121 lb 14.4 oz (55.3 kg)  07/14/17 124 lb (56.2 kg)      Other studies Reviewed: Additional studies/ records that were reviewed today include: Event monitor 07/24/17: Study Highlights    Normal sinus rhythm  Periods of intermittent complete heart block and Type 2 second degree AV block with 2:1 conduction   Echo 07/31/17: Study Conclusions  - Left ventricle: The cavity size was normal. Wall thickness was   normal. Systolic function was normal. The estimated ejection   fraction was in the range of 60% to 65%. Wall motion was normal;   there were no regional wall  motion abnormalities. Doppler   parameters are consistent with abnormal left ventricular   relaxation (grade 1 diastolic dysfunction). - Aortic valve: There was no stenosis. - Mitral valve: The findings are consistent with moderate stenosis.   There was no significant regurgitation. Mean gradient (D): 8 mm   Hg. Valve area by pressure half-time: 1.49 cm^2. - Left atrium: The atrium was moderately dilated. - Right ventricle: The cavity size was normal. Systolic function   was normal. - Tricuspid valve: Peak RV-RA gradient (S): 26 mm Hg. - Pulmonary arteries: PA peak pressure: 29 mm Hg (S). - Inferior vena cava: The vessel was normal in size. The   respirophasic diameter changes were in the normal range (>= 50%),   consistent with normal central venous pressure. - Pericardium, extracardiac: Small to moderate pericardial   effusion. < 25% respirophasic variation of mitral E inflow   velocity. IVC is not dilated. No significant RV diastolic   collapse. No pericardial tamponade.  Impressions:  - Normal LV size with EF 60-65%. Normal RV size and systolic   function. 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 There was a small to moderate pericardial   effusion without tamponade.  ETT 08/05/17: Study Highlights     Blood pressure demonstrated a hypertensive response to exercise.  There was no ST segment deviation noted during stress.   Abnormal ETT with poor exercise tolerance (1:01); pt complained of chest discomfort during the study; hypertensive BP response; pt developed 2:1 AV block with exertion (that correlated with chest pain) that resolved in recovery; no ST changes. Pt reviewed with Dr Curt Bears prior to DC.    Cardiac cath/PCI: 08/08/17: Procedures   CORONARY STENT INTERVENTION  RIGHT/LEFT HEART CATH AND CORONARY ANGIOGRAPHY  Conclusion    80% ostial RCA.  The RCA is dominant.  [place]  to 40% proximal and mid LAD.  70% proximal diagonal  #1.  Widely patent circumflex with tortuous obtuse marginals.  Normal left main  Calcified mitral valve and annulus.  No significant mitral stenosis is noted.  Mild pulmonary hypertension  Normal left ventricular systolic function with elevated end-diastolic and pulmonary wedge pressure in the 20 to 22 mmHg range consistent with diastolic heart failure, chronic.  Successful PCI and stent of the ostial RCA from 80% with TIMI grade III flow to less than 10% with TIMI grade III flow using a 3.0 x 12 mm Synergy postdilated to 3.5 mmHg pressure.  RECOMMENDATIONS:   Aspirin and Plavix for at least 6 months.  Pacemaker may be needed if continued prolonged episodes of second-degree heart block with bradycardia.  Aggressive risk factor modification.  Probable discharge in a.m. if no complications.  Indications   Angina pectoris (HCC) [I20.9 (ICD-10-CM)]  Coronary artery disease of native artery of native heart with stable angina pectoris (HCC) [I25.118 (ICD-10-CM)]  Procedural Details/Technique   Technical Details The right radial area was sterilely prepped and draped. Intravenous sedation with Versed and fentanyl was administered. 1% Xylocaine was infiltrated to achieve local analgesia. Using real-time vascular ultrasound, a double wall stick with an angiocath was utilized to obtain intra-arterial access. A VUS image was saved for the permanent record.The modified Seldinger technique was used to place a 78F " Slender" sheath in the right radial artery. Weight based heparin was administered. Coronary angiography was done using 5 F catheters. Right coronary angiography was performed with a JR4. Left ventricular hemodymic recordings and angiography was done using the JR 4 catheter and hand injection. Left coronary angiography was performed with a JL 3.5 cm.  Right heart catheterization was performed by exchanging a previously placed antecubital IV angio-cath for a 5 French Slender sheath. 1%  Xylocaine was used to locally nesthetize the area around the IV site. The IV catheter was wired using an .018 guidewire. The modified Seldinger technique was used to place the 5 [place] sheath. Double glove technique was used to enhance sterility. After sheath insertion, right heart cath was performed using a 5 French balloon tipped catheter and fluoroscopic guidance. Pressures were recorded in each chamber and in the pulmonary capillary wedge position.. The main pulmonary artery O2 saturation was sampled.    Digital images demonstrated significant ostial to proximal obstruction of the right coronary and luminal irregularities in the LAD and diagonal. After discussion with the patient and Dr. Martinique, we decided to proceed with PCI of the ostial RCA. Of note is a recent Holter that demonstrated high-grade AV block throughout much of the study. She may yet require a pacemaker but the possibility of right coronary ischemia contributing to AV block was considered.  Additional IV heparin was administered completing a total of 10,000 units for the case. Because of a slightly downgoing RCA ostium, we chose an right bypass graft catheter as a guide. A Pro-water wire was used to advance into the distal right coronary without difficulty. With care, a 12 mm x 3 mm [place] balloon was positioned across the ostium and angioplasty was performed. A Synergy 12 x 3 mm DES was positioned to cover the ostium and deployed at 15 atm. A 12 x 8.5  balloon was then positioned extending into the ascending aorta and expanded to 15 atm x 2. A nice angiographic result was obtained with less than 10% residual stenosis. TIMI grade III flow was noted. The procedure was terminated  Hemostasis was achieved  using a pneumatic band.  During this procedure the patient is administered a total of Versed 2 mg and Fentanyl 75 mg to achieve and maintain moderate conscious sedation. The patient's heart rate, blood pressure, and oxygen saturation are  monitored continuously during the procedure. The period of conscious sedation is 84 minutes, of which I was present face-to-face 100% of this time.   Estimated blood loss <50 mL.  During this procedure the patient was administered the following to achieve and maintain moderate conscious sedation: Versed 2 mg, Fentanyl 75 mcg, while the patient's heart rate, blood pressure, and oxygen saturation were continuously monitored. The period of conscious sedation was 84 minutes, of which I was present face-to-face 100% of this time.  Coronary Findings   Diagnostic  Dominance: Right  Left Anterior Descending  Ost LAD to Prox LAD lesion 40% stenosed  Ost LAD to Prox LAD lesion is 40% stenosed.  First Diagonal Branch  Ost 1st Diag lesion 70% stenosed  Ost 1st Diag lesion is 70% stenosed.  Left Circumflex  First Obtuse Marginal Branch  Vessel is small in size.  Second Obtuse Marginal Branch  Vessel is small in size.  Right Coronary Artery  Ost RCA lesion 80% stenosed  Ost RCA lesion is 80% stenosed.  Intervention   No interventions have been documented.  Right Heart   Right Heart Pressures Hemodynamic findings consistent with mild pulmonary hypertension. Elevated LV EDP consistent with volume overload.  Wall Motion   Resting               Left Heart   Left Ventricle The left ventricular size is normal. The left ventricular systolic function is normal. LV end diastolic pressure is moderately elevated. The left ventricular ejection fraction is 55-65% by visual estimate.  Mitral Valve There is mild mitral valve stenosis. The annulus is calcified.  Coronary Diagrams   Diagnostic Diagram       Implants    Permanent Stent  Stent Synergy Des 3x12 - [id number] - Implanted    Inventory item: STENT SYNERGY DES 3X12 Model/Cat number: [id number]  Manufacturer: [employer] Lot number: [id number]  Device identifier: [id number] Device identifier type: GS1  Area Of  Implantation: Prox RCA    GUDID Information   Request status Successful    Brand name: SYNERGY Version/Model: [id number]  Company name: [employer] MRI safety info as of 08/08/17: MR Conditional  Contains dry or latex rubber: No    GMDN P.T. name: Drug-eluting coronary artery stent, bioabsorbable-polymer-coated    As of 08/08/2017   Status: Implanted      MERGE Images   Show images for CARDIAC CATHETERIZATION   Link to Procedure Log   Procedure Log    Hemo Data    Most Recent Value  Fick Cardiac Output 4.15 L/min  Fick Cardiac Output Index 2.68 (L/min)/BSA  RA A Wave 10 mmHg  RA V Wave 9 mmHg  RA Mean 7 mmHg  RV Systolic Pressure 43 mmHg  RV Diastolic Pressure 5 mmHg  RV EDP 10 mmHg  PA Systolic Pressure 42 mmHg  PA Diastolic Pressure 15 mmHg  PA Mean 27 mmHg  PW A Wave 26 mmHg  PW V Wave 30 mmHg  PW Mean 20 mmHg  AO Systolic Pressure 448 mmHg  AO Diastolic Pressure 61 mmHg  AO Mean 94 mmHg  LV Systolic Pressure 185 mmHg  LV Diastolic Pressure 6 mmHg  LV EDP 22 mmHg  Arterial Occlusion Pressure Extended Systolic Pressure 631 mmHg  Arterial Occlusion Pressure Extended Diastolic Pressure 66 mmHg  Arterial Occlusion Pressure Extended Mean Pressure 103 mmHg  Left Ventricular Apex Extended Systolic Pressure 056 mmHg  Left Ventricular Apex Extended Diastolic Pressure 9 mmHg  Left Ventricular Apex Extended EDP Pressure 21 mmHg  QP/QS 1  TPVR Index 10.09 HRUI  TSVR Index 35.13 HRUI  PVR SVR Ratio 0.14  TPVR/TSVR Ratio 0.29   Pacemaker implant 08/11/17: CONCLUSIONS:   1. Successful implantation of a Medtronic Azure XT DR MRI SureScan dual-chamber pacemaker for symptomatic bradycardia  2. No early apparent complications.        ASSESSMENT AND PLAN:  1. CAD. S/p DES of ostial RCA on 08/08/17. Recommend continued DAPT for 6 months then stop Plavix. Continue statin. Refer to Cardiac Rehab. Will treat residual disease in a diagonal branch  medically.  2. CHB s/p DDD pacemaker insertion on 08/11/17. Follow up with EP 3. Mild Mitral stenosis on Echo. Right heart cath without significant stenosis/vavle gradient 4. Diastolic dysfunction- minimally symptomatic.  5. PAT. Noted 10 beat run on prior office visit.  Will observe. 6. RA. OK to continue treatment as prescribed by Dr. Amil Amen.   Current medicines are reviewed at length with the patient today.  The patient does not have concerns regarding medicines.  The following changes have been made:  no change  Labs/ tests ordered today include:   Orders Placed This Encounter  Procedures   AMB referral to cardiac rehabilitation     Disposition:   FU with me PRN.  Signed, Kayleb Warshaw Martinique, MD  09/03/2017 3:34 PM    [facility] [address], [place], [place], [address] Phone [phone], Fax [phone]

## 2017-09-03 ENCOUNTER — Encounter: Payer: Self-pay | Admitting: Cardiology

## 2017-09-03 ENCOUNTER — Ambulatory Visit (INDEPENDENT_AMBULATORY_CARE_PROVIDER_SITE_OTHER): Payer: Medicare Other | Admitting: Cardiology

## 2017-09-03 VITALS — BP 145/71 | HR 69 | Ht 62.5 in | Wt 118.6 lb

## 2017-09-03 DIAGNOSIS — I25118 Atherosclerotic heart disease of native coronary artery with other forms of angina pectoris: Secondary | ICD-10-CM

## 2017-09-03 DIAGNOSIS — I442 Atrioventricular block, complete: Secondary | ICD-10-CM | POA: Diagnosis not present

## 2017-09-03 DIAGNOSIS — Z950 Presence of cardiac pacemaker: Secondary | ICD-10-CM | POA: Diagnosis not present

## 2017-09-03 NOTE — Patient Instructions (Signed)
Continue your current therapy   I will see you in 3 months. 

## 2017-09-11 DIAGNOSIS — M0579 Rheumatoid arthritis with rheumatoid factor of multiple sites without organ or systems involvement: Secondary | ICD-10-CM | POA: Diagnosis not present

## 2017-09-18 ENCOUNTER — Telehealth (HOSPITAL_COMMUNITY): Payer: Self-pay

## 2017-09-18 NOTE — Telephone Encounter (Signed)
Daughter of patient called in regards to Cardiac Rehab - Scheduled orientation on 11/06/2017 at 1:30pm. Patient will attend the 1:15pm exc class. Mailed packet.

## 2017-09-21 NOTE — Progress Notes (Signed)
Electrophysiology Office Note Date: 09/22/2017  ID:  Elizabeth Estrada, Elizabeth Estrada 08/16/1940, MRN [id number]  PCP: [name], MD Primary Cardiologist: [place] Electrophysiologist: Curt Bears  CC: 6 week HBP follow up  Elizabeth Estrada is a 77 y.o. female seen today for Dr Curt Bears.  She presents today for routine electrophysiology followup.  Since last being seen in our clinic, the patient reports doing very well. She has occasional shortness of breath with exertion. She denies chest pain, palpitations,  PND, orthopnea, nausea, vomiting, dizziness, syncope, edema, weight gain, or early satiety.  Device History: MDT dual chamber (HIS bundle) PPM implanted 2019 for complete heart block    Past Medical History:  Diagnosis Date  . Anxiety   . 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 Chronic depression   . Coronary artery disease   . History of kidney stones   . Hypertension   . Kidney stone   . Melanoma ([place])    removed from back  . MVP (mitral valve prolapse)   . Osteoporosis   . RA (rheumatoid arthritis) ([place])   . Rheumatoid arthritis(714.0)    HANDS. KNEES  . Rib fracture    Past Surgical History:  Procedure Laterality Date  . BREAST SURGERY     LEFT BREAST BX- BENIGN  . CARDIOVASCULAR STRESS TEST  10/19/1999, 2013   EF 97%, NO EVIDENCE OF ISCHEMIA, 2013 NORMAL  . COLONOSCOPY    . CORONARY STENT INTERVENTION N/A 08/08/2017   Procedure: CORONARY STENT INTERVENTION;  Surgeon: [name], MD;  Location: [facility];  Service: Cardiovascular;  Laterality: N/A;  . DILATATION & CURRETTAGE/HYSTEROSCOPY WITH RESECTOCOPE N/A 07/06/2013   Procedure: DILATATION & CURETTAGE/HYSTEROSCOPY WITH RESECTOCOPE with removal of endometrial lesion;  Surgeon: [name], MD;  Location: [facility];  Service: Gynecology;  Laterality: N/A;  . EYE SURGERY     BILATERAL CATARACT  . melanoma removed     from back  . PACEMAKER IMPLANT N/A 08/11/2017   Procedure:  PACEMAKER IMPLANT;  Surgeon: [name], MD;  Location: [facility];  Service: Cardiovascular;  Laterality: N/A;  . REMOVAL OF CYST FROM HER LEFT BREAST Left 8/05   benign - Dr. Margot Chimes  . RIGHT/LEFT HEART CATH AND CORONARY ANGIOGRAPHY N/A 08/08/2017   Procedure: RIGHT/LEFT HEART CATH AND CORONARY ANGIOGRAPHY;  Surgeon: [name], MD;  Location: [facility];  Service: Cardiovascular;  Laterality: N/A;    Current Outpatient Medications  Medication Sig Dispense Refill  . abatacept (ORENCIA) 250 MG injection Inject 250 mg into the vein every 30 (thirty) days.     Marland Kitchen aspirin 81 MG tablet Take 81 mg by mouth every other day.     Marland Kitchen atorvastatin (LIPITOR) 80 MG tablet Take 1 tablet (80 mg total) by mouth daily at 6 PM. 30 tablet 5  . cetirizine (ZYRTEC) 10 MG tablet Take 10 mg by mouth daily as needed for allergies.    . cholecalciferol (VITAMIN D) 1000 UNITS tablet Take 1,000 Units by mouth daily.    . clopidogrel (PLAVIX) 75 MG tablet Take 1 tablet (75 mg total) by mouth daily with breakfast. 30 tablet 11  . escitalopram (LEXAPRO) 10 MG tablet Take 10 mg by mouth at bedtime.     . fluticasone (FLONASE) 50 MCG/ACT nasal spray Place 1-2 sprays into both nostrils daily as needed for allergies or rhinitis.    . Polyvinyl Alcohol-Povidone (REFRESH OP) Place 1-2  drops into both eyes daily as needed (for dry eyes).     No current facility-administered medications for this visit.     Allergies:   Methotrexate derivatives; Plaquenil [hydroxychloroquine sulfate]; Remicade [infliximab]; Ridaura [auranofin]; and Sudafed [pseudoephedrine]   Social History: Social History   Socioeconomic History  . Marital status: Married    Spouse name: Not on file  . Number of children: 0  . Years of education: Not on file  . Highest education level: Not on file  Occupational History  . Not on file  Social Needs  . Financial resource strain: Not on file  . Food insecurity:    Worry: Not  on file    Inability: Not on file  . Transportation needs:    Medical: Not on file    Non-medical: Not on file  Tobacco Use  . Smoking status: Never Smoker  . Smokeless tobacco: Never Used  Substance and Sexual Activity  . Alcohol use: No  . Drug use: No  . Sexual activity: Never    Partners: Male    Birth control/protection: Post-menopausal  Lifestyle  . Physical activity:    Days per week: Not on file    Minutes per session: Not on file  . Stress: Not on file  Relationships  . Social connections:    Talks on phone: Not on file    Gets together: Not on file    Attends religious service: Not on file    Active member of club or organization: Not on file    Attends meetings of clubs or organizations: Not on file    Relationship status: Not on file  . Intimate partner violence:    Fear of current or ex partner: Not on file    Emotionally abused: Not on file    Physically abused: Not on file    Forced sexual activity: Not on file  Other Topics Concern  . Not on file  Social History Narrative  . Not on file    Family History: Family History  Problem Relation Age of Onset  . Coronary artery disease Mother   . Pancreatic cancer Father   . Heart disease Father        bypass  . Hypertension Father   . CAD Brother 86       CABG and AVR     Review of Systems: All other systems reviewed and are otherwise negative except as noted above.   Physical Exam: VS:  BP 138/72   Pulse 60   Ht 5' 2.5" (1.588 m)   Wt 118 lb (53.5 kg)   LMP 04/15/1990   SpO2 99%   BMI 21.24 kg/m  , BMI Body mass index is 21.24 kg/m.  GEN- The patient is well appearing, alert and oriented x 3 today.   HEENT: normocephalic, atraumatic; sclera clear, conjunctiva pink; hearing intact; oropharynx clear; neck supple  Lungs- Clear to ausculation bilaterally, normal work of breathing.  No wheezes, rales, rhonchi Heart- Regular rate and rhythm (paced) GI- soft, non-tender, non-distended, bowel  sounds present  Extremities- no clubbing, cyanosis, or edema  MS- no significant deformity or atrophy Skin- warm and dry, no rash or lesion; PPM pocket well healed Psych- euthymic mood, full affect Neuro- strength and sensation are intact  PPM Interrogation- reviewed in detail today,  See PACEART report  EKG:  EKG is ordered today. The ekg ordered today shows AV pacing  Recent Labs: 07/14/2017: TSH 1.610 08/11/2017: Magnesium 2.0 08/13/2017: ALT 15; BUN 18; Creatinine,  Ser 0.79; Hemoglobin 12.5; Platelets 150; Potassium 4.3; Sodium 138   Wt Readings from Last 3 Encounters:  09/22/17 118 lb (53.5 kg)  09/03/17 118 lb 9.6 oz (53.8 kg)  08/09/17 121 lb 14.4 oz (55.3 kg)     Other studies Reviewed: Additional studies/ records that were reviewed today include: hospital records   Assessment and Plan:  1.  Complete heart block Normal PPM function See Pace Art report No changes today His bundle capture non selective down to loss of capture (4V bipolar, 3V unipolar) She has intrinsic conduction today. If she remains with conduction at next office visit, would consider programming MVP on to preserve battery longevity.   2.  CAD S/p DES to RCA 07/2017 Continue Plavix and ASA for 6 months post intervention per Dr Doug Sou note Cardiac rehab  3.  Atrial tachycardia No recurrence by device interrogation today  4.  Sinus bradycardia Currently RA pacing 33% of the time Depending on trend at next office visit, consider turning rate response on.    Current medicines are reviewed at length with the patient today.   The patient does not have concerns regarding her medicines.  The following changes were made today:  none  Labs/ tests ordered today include: none Orders Placed This Encounter  Procedures  . CUP PACEART [place]  . EKG 12-Lead     Disposition:   Follow up with [facility], Dr Curt Bears as scheduled     Signed, Chanetta Marshall, NP 09/22/2017 8:58 AM  [facility] [address] [place] [address] [phone] (office) [phone] (fax)

## 2017-09-22 ENCOUNTER — Encounter: Payer: Self-pay | Admitting: Nurse Practitioner

## 2017-09-22 ENCOUNTER — Ambulatory Visit (INDEPENDENT_AMBULATORY_CARE_PROVIDER_SITE_OTHER): Payer: Medicare Other | Admitting: Nurse Practitioner

## 2017-09-22 VITALS — BP 138/72 | HR 60 | Ht 62.5 in | Wt 118.0 lb

## 2017-09-22 DIAGNOSIS — I442 Atrioventricular block, complete: Secondary | ICD-10-CM

## 2017-09-22 DIAGNOSIS — I471 Supraventricular tachycardia: Secondary | ICD-10-CM

## 2017-09-22 DIAGNOSIS — Z8679 Personal history of other diseases of the circulatory system: Secondary | ICD-10-CM | POA: Diagnosis not present

## 2017-09-22 DIAGNOSIS — I25118 Atherosclerotic heart disease of native coronary artery with other forms of angina pectoris: Secondary | ICD-10-CM | POA: Diagnosis not present

## 2017-09-22 LAB — CUP PACEART INCLINIC DEVICE CHECK
Date Time Interrogation Session: 20190610082515
Implantable Lead Implant Date: 20190429
Implantable Lead Implant Date: 20190429
Implantable Lead Location: 753859
Implantable Lead Location: 753860
Implantable Lead Model: 3830
Implantable Lead Model: 5076
Implantable Pulse Generator Implant Date: 20190429

## 2017-09-22 NOTE — Patient Instructions (Addendum)
Medication Instructions:   Your physician recommends that you continue on your current medications as directed. Please refer to the Current Medication list given to you today.   If you need a refill on your cardiac medications before your next appointment, please call your pharmacy.   Labwork: NONE ORDERED  TODAY    Testing/Procedures:  NONE ORDERED  TODAY    Follow-Up:  AS SCHEDULED WITH DR CAMNITZ   Any Other Special Instructions Will Be Listed Below (If Applicable).

## 2017-09-24 ENCOUNTER — Telehealth: Payer: Self-pay | Admitting: Nurse Practitioner

## 2017-09-24 NOTE — Telephone Encounter (Signed)
Spoke with patient's daughter Wilson Singer [phone]) who has questions regarding her mother's OV on 6/10.    They have questions about the battery life indicated on her Apple watch APP, conversation about shutting off pacemaker to conserve generator life and wanted you to know that lately she has increased SOB with exertion, more than indicated during visit.  She is supposed to visit her daughter this weekend at the beach and was wondering if safe to travel.  A lot of the OV is confusing to the patient and not well relayed to the daughter.  Please call to clarify, thank you.

## 2017-09-24 NOTE — Telephone Encounter (Signed)
New Message    Patient and her daughter Otila Kluver is calling together. They are wanting to speak with the nurse in reference to the patients appointment on 09/22/2017. They just have a few questions and concerns. Please call.

## 2017-09-25 NOTE — Telephone Encounter (Signed)
Spent 32 minutes speaking w/ pt and dtr, explaining last OV w/ [employer]. Pt's dtr has had a conversation w/ mom about reporting her SOB correctly.  Pt has been telling doctors she has "some" SOB -- this is under reported.  Pt actually has worsening  SOB since implant and heart stenting. Scheduled pt to see Dr. Curt Bears next week to review frequency of SOB and possibly reprogramming device. Patient & dtr  verbalized understanding and agreeable to plan. (they thanked me several times for spending so much time speaking with them and explaining things)

## 2017-09-25 NOTE — Telephone Encounter (Signed)
I'm out until next week.  At office visit we discussed that energy required to capture bottom chamber of her heart was higher than typical, but lead was functioning normally.  She had intrinsic conduction.  We discussed that if she still had intrinsic conduction at office visit with Dr Ileana Ladd could consider device reprogramming to limit pacing.  She atrially paces 30% of the time.  Histograms looked good.  Could consider rate response to see if exercise intolerance improves. If she is concerned about symptoms, I would have her come in to see Dr Curt Bears in the next week or so to discuss device reprogramming/need for additional testing.  Chanetta Marshall, NP 09/25/2017 7:21 AM

## 2017-09-30 ENCOUNTER — Ambulatory Visit (INDEPENDENT_AMBULATORY_CARE_PROVIDER_SITE_OTHER): Payer: Medicare Other | Admitting: Cardiology

## 2017-09-30 ENCOUNTER — Encounter: Payer: Self-pay | Admitting: Cardiology

## 2017-09-30 VITALS — BP 124/68 | HR 78 | Ht 62.5 in | Wt 118.8 lb

## 2017-09-30 DIAGNOSIS — I442 Atrioventricular block, complete: Secondary | ICD-10-CM

## 2017-09-30 DIAGNOSIS — R0602 Shortness of breath: Secondary | ICD-10-CM

## 2017-09-30 DIAGNOSIS — I2510 Atherosclerotic heart disease of native coronary artery without angina pectoris: Secondary | ICD-10-CM

## 2017-09-30 DIAGNOSIS — Z01812 Encounter for preprocedural laboratory examination: Secondary | ICD-10-CM | POA: Diagnosis not present

## 2017-09-30 LAB — CUP PACEART INCLINIC DEVICE CHECK
Battery Remaining Longevity: 46 mo
Battery Voltage: 3.09 V
Brady Statistic AP VP Percent: 32.94 %
Brady Statistic AP VS Percent: 0.04 %
Brady Statistic AS VP Percent: 66.84 %
Brady Statistic AS VS Percent: 0.18 %
Brady Statistic RA Percent Paced: 32.86 %
Brady Statistic RV Percent Paced: 99.78 %
Date Time Interrogation Session: 20190618162917
Implantable Lead Implant Date: 20190429
Implantable Lead Implant Date: 20190429
Implantable Lead Location: 753859
Implantable Lead Location: 753860
Implantable Lead Model: 3830
Implantable Lead Model: 5076
Implantable Pulse Generator Implant Date: 20190429
Lead Channel Impedance Value: 323 Ohm
Lead Channel Impedance Value: 342 Ohm
Lead Channel Impedance Value: 456 Ohm
Lead Channel Impedance Value: 513 Ohm
Lead Channel Pacing Threshold Amplitude: 0.875 V
Lead Channel Pacing Threshold Amplitude: 2.375 V
Lead Channel Pacing Threshold Pulse Width: 0.4 ms
Lead Channel Pacing Threshold Pulse Width: 0.4 ms
Lead Channel Sensing Intrinsic Amplitude: 3.125 mV
Lead Channel Sensing Intrinsic Amplitude: 3.25 mV
Lead Channel Sensing Intrinsic Amplitude: 3.5 mV
Lead Channel Setting Pacing Amplitude: 2.5 V
Lead Channel Setting Pacing Amplitude: 5 V
Lead Channel Setting Pacing Pulse Width: 1 ms
Lead Channel Setting Sensing Sensitivity: 0.9 mV

## 2017-09-30 NOTE — Patient Instructions (Addendum)
Medication Instructions:  Your physician recommends that you continue on your current medications as directed. Please refer to the Current Medication list given to you today.  Labwork: Today: BNP and pre procedure labs of BMET & CBC     *We will only notify you of abnormal results, otherwise continue current treatment plan.  Testing/Procedures: Your physician has recommended that you have a pacemaker RV lead revision. Please see the instructions below under special instructions.  Follow-Up: Your physician recommends that you schedule a follow-up appointment in: 10-14 days, after your procedure on 10/13/2017, with device clinic for a wound check.  Your physician recommends that you schedule a follow-up appointment in: 3 months, after your procedure on 10/13/2017, with Dr. Curt Bears.   * If you need a refill on your cardiac medications before your next appointment, please call your pharmacy.   *Please note that any paperwork needing to be filled out by the provider will need to be addressed at the front desk prior to seeing the provider. Please note that any FMLA, disability or other documents regarding health condition is subject to a $25.00 charge that must be received prior to completion of paperwork in the form of a money order or check.  Thank you for choosing [facility]!!   Trinidad Curet, RN [phone]  Any Other Special Instructions Will Be Listed Below (If Applicable).  Implantable Device Instructions  You are scheduled for:                  _____ Pacemaker Lead Revision  on  10/13/2017  with Dr. Curt Bears.  1.   Please arrive at the [facility], Entrance "A"  at [facility] at  7:30 a.m. on the day of your procedure. (The address is [address]  2. Do not eat or drink after midnight the night before your procedure.  3.   Complete pre procedure  lab work on 09/30/17.  The lab at [facility] is open from 8:00 AM to 4:30 PM.  You do not  have to be fasting.  4.   Hold all of your morning medications the morning of your procedure.  5.  Plan for an overnight stay.  Bring your insurance cards and a list of you medications.  6.  Wash your chest and neck with surgical scrub the evening before and the morning of  your procedure.  Rinse well. Please review the surgical scrub instruction sheet given to you.                                                                                                                 * If you have ANY questions after you get home, please call Trinidad Curet, RN @ [phone].  * Every attempt is made to prevent procedures from being rescheduled.  Due to the nature of  Electrophysiology, rescheduling can happen.  The physician is always aware and directs the staff when this occurs.

## 2017-09-30 NOTE — Progress Notes (Signed)
Electrophysiology Office Note   Date:  09/30/2017   ID:  Elizabeth Estrada, DOB [birth year], MRN [id number]  PCP:  [name], MD  Cardiologist:  [place] Primary Electrophysiologist:  [name], MD    No chief complaint on file.    History of Present Illness: Elizabeth Estrada is a 77 y.o. female who is being seen today for the evaluation of complete heart block at the request of [name], MD. Presenting today for electrophysiology evaluation.  She has a history of coronary artery disease, hypertension, complete and complete heart block status post Medtronic dual-chamber pacemaker implanted April 2019.  She had a his lead implanted with a rising threshold.  Fortunately, she is now conducting.  Today, she denies symptoms of palpitations, chest pain, orthopnea, PND, lower extremity edema, claudication, dizziness, presyncope, syncope, bleeding, or neurologic sequela. The patient is tolerating medications without difficulties.  Her main symptom is shortness of breath.  She gets short of breath when she exerts herself walking up and down stairs.  There also times that she gets short of breath when she sitting and not doing any activity.  Her shortness of breath lasts for a few seconds when she is sitting, but is more long-lasting when she is exerting herself.   Past Medical History:  Diagnosis Date  . Anxiety   . 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 Chronic depression   . Coronary artery disease   . History of kidney stones   . Hypertension   . Kidney stone   . Melanoma ([place])    removed from back  . MVP (mitral valve prolapse)   . Osteoporosis   . RA (rheumatoid arthritis) ([place])   . Rheumatoid arthritis(714.0)    HANDS. KNEES  . Rib fracture    Past Surgical History:  Procedure Laterality Date  . BREAST SURGERY     LEFT BREAST BX- BENIGN  . CARDIOVASCULAR STRESS TEST  10/19/1999, 2013   EF 97%, NO EVIDENCE OF ISCHEMIA, 2013 NORMAL  .  COLONOSCOPY    . CORONARY STENT INTERVENTION N/A 08/08/2017   Procedure: CORONARY STENT INTERVENTION;  Surgeon: [name], MD;  Location: [facility];  Service: Cardiovascular;  Laterality: N/A;  . DILATATION & CURRETTAGE/HYSTEROSCOPY WITH RESECTOCOPE N/A 07/06/2013   Procedure: DILATATION & CURETTAGE/HYSTEROSCOPY WITH RESECTOCOPE with removal of endometrial lesion;  Surgeon: [name], MD;  Location: [facility];  Service: Gynecology;  Laterality: N/A;  . EYE SURGERY     BILATERAL CATARACT  . melanoma removed     from back  . PACEMAKER IMPLANT N/A 08/11/2017   Procedure: PACEMAKER IMPLANT;  Surgeon: [name], MD;  Location: [facility];  Service: Cardiovascular;  Laterality: N/A;  . REMOVAL OF CYST FROM HER LEFT BREAST Left 8/05   benign - Dr. Margot Chimes  . RIGHT/LEFT HEART CATH AND CORONARY ANGIOGRAPHY N/A 08/08/2017   Procedure: RIGHT/LEFT HEART CATH AND CORONARY ANGIOGRAPHY;  Surgeon: [name], MD;  Location: [facility];  Service: Cardiovascular;  Laterality: N/A;     Current Outpatient Medications  Medication Sig Dispense Refill  . abatacept (ORENCIA) 250 MG injection Inject 250 mg into the vein every 30 (thirty) days.     Marland Kitchen aspirin 81 MG tablet Take 81 mg by mouth every other day.     Marland Kitchen atorvastatin (LIPITOR) 80 MG tablet Take 1 tablet (80 mg total) by mouth daily at 6 PM. 30  tablet 5  . cetirizine (ZYRTEC) 10 MG tablet Take 10 mg by mouth daily as needed for allergies.    . cholecalciferol (VITAMIN D) 1000 UNITS tablet Take 1,000 Units by mouth daily.    . clopidogrel (PLAVIX) 75 MG tablet Take 1 tablet (75 mg total) by mouth daily with breakfast. 30 tablet 11  . escitalopram (LEXAPRO) 10 MG tablet Take 10 mg by mouth at bedtime.     . fluticasone (FLONASE) 50 MCG/ACT nasal spray Place 1-2 sprays into both nostrils daily as needed for allergies or rhinitis.    . Polyvinyl Alcohol-Povidone (REFRESH OP) Place 1-2 drops into both eyes daily as  needed (for dry eyes).     No current facility-administered medications for this visit.     Allergies:   Methotrexate derivatives; Plaquenil [hydroxychloroquine sulfate]; Remicade [infliximab]; Ridaura [auranofin]; and Sudafed [pseudoephedrine]   Social History:  The patient  reports that she has never smoked. She has never used smokeless tobacco. She reports that she does not drink alcohol or use drugs.   Family History:  The patient's family history includes CAD (age of onset: 44) in her brother; Coronary artery disease in her mother; Heart disease in her father; Hypertension in her father; Pancreatic cancer in her father.    ROS:  Please see the history of present illness.   Otherwise, review of systems is positive for this of breath, anxiety, easy bruising.   All other systems are reviewed and negative.    PHYSICAL EXAM: VS:  BP 124/68   Pulse 78   Ht 5' 2.5" (1.588 m)   Wt 118 lb 12.8 oz (53.9 kg)   LMP 04/15/1990   SpO2 98%   BMI 21.38 kg/m  , BMI Body mass index is 21.38 kg/m. GEN: Well nourished, well developed, in no acute distress  HEENT: normal  Neck: no JVD, carotid bruits, or masses Cardiac: RRR; no murmurs, rubs, or gallops,no edema  Respiratory:  clear to auscultation bilaterally, normal work of breathing GI: soft, nontender, nondistended, + BS MS: no deformity or atrophy  Skin: warm and dry, device pocket is well healed Neuro:  Strength and sensation are intact Psych: euthymic mood, full affect  EKG:  EKG is ordered today. Personal review of the ekg ordered shows anus rhythm, ventricular paced  Device interrogation is reviewed today in detail.  See PaceArt for details.   Recent Labs: 07/14/2017: TSH 1.610 08/11/2017: Magnesium 2.0 08/13/2017: ALT 15; BUN 18; Creatinine, Ser 0.79; Hemoglobin 12.5; Platelets 150; Potassium 4.3; Sodium 138    Lipid Panel     Component Value Date/Time   CHOL 227 (H) 07/14/2017 1214   TRIG 43 07/14/2017 1214   HDL 91  07/14/2017 1214   CHOLHDL 2.5 07/14/2017 1214   LDLCALC 127 (H) 07/14/2017 1214     Wt Readings from Last 3 Encounters:  09/30/17 118 lb 12.8 oz (53.9 kg)  09/22/17 118 lb (53.5 kg)  09/03/17 118 lb 9.6 oz (53.8 kg)      Other studies Reviewed: Additional studies/ records that were reviewed today include: TTE 07/31/17  Review of the above records today demonstrates:  - Left ventricle: The cavity size was normal. Wall thickness was   normal. Systolic function was normal. The estimated ejection   fraction was in the range of 60% to 65%. Wall motion was normal;   there were no regional wall motion abnormalities. Doppler   parameters are consistent with abnormal left ventricular   relaxation (grade 1 diastolic dysfunction). - Aortic  valve: There was no stenosis. - Mitral valve: The findings are consistent with moderate stenosis.   There was no significant regurgitation. Mean gradient (D): 8 mm   Hg. Valve area by pressure half-time: 1.49 cm^2. - Left atrium: The atrium was moderately dilated. - Right ventricle: The cavity size was normal. Systolic function   was normal. - Tricuspid valve: Peak RV-RA gradient (S): 26 mm Hg. - Pulmonary arteries: PA peak pressure: 29 mm Hg (S). - Inferior vena cava: The vessel was normal in size. The   respirophasic diameter changes were in the normal range (>= 50%),   consistent with normal central venous pressure. - Pericardium, extracardiac: Small to moderate pericardial   effusion. < 25% respirophasic variation of mitral E inflow   velocity. IVC is not dilated. No significant RV diastolic   collapse. No pericardial tamponade.  LHC/RHC 08/08/17  80% ostial RCA.  The RCA is dominant.  30 to 40% proximal and mid LAD.  70% proximal diagonal #1.  Widely patent circumflex with tortuous obtuse marginals.  Normal left main  Calcified mitral valve and annulus.  No significant mitral stenosis is noted.  Mild pulmonary hypertension  Normal left  ventricular systolic function with elevated end-diastolic and pulmonary wedge pressure in the 20 to 22 mmHg range consistent with diastolic heart failure, chronic.  Successful PCI and stent of the ostial RCA from 80% with TIMI grade III flow to less than 10% with TIMI grade III flow using a 3.0 x 12 mm Synergy postdilated to 3.5 mmHg pressure.   ASSESSMENT AND PLAN:  1.  Complete heart block: This post Medtronic dual-chamber pacemaker implanted 08/11/2017.  Unfortunately, her RV lead threshold is gone up.  She will likely need lead revision.  Risks and benefits were discussed and include bleeding, tamponade, infection, pneumothorax.  The patient understands the risks and is agreed to the procedure.    2.  Coronary artery disease: Status post drug-eluting stent to the RCA 07/2017.  Currently on aspirin and Plavix for 6 months.  No changes.  3.  Atrial tachycardia: None seen on recent device interrogation.  4.  Sinus bradycardia: Status post Medtronic pacemaker.  5.  Shortness of breath: At this point, it is difficult to tell as to the cause of her shortness of breath.  Will check labs today as a preop for her lead revision and will include a BNP.  I do not see signs of volume overload.  I do not think that it could be related to coronary disease and she has had a normal echo in the past.  Current medicines are reviewed at length with the patient today.   The patient does not have concerns regarding her medicines.  The following changes were made today:  none  Labs/ tests ordered today include:  Orders Placed This Encounter  Procedures  . Basic Metabolic Panel (BMET)  . CBC  . Pro b natriuretic peptide (BNP)  . EKG 12-Lead     Disposition:   FU with Will Camnitz 3 months  Signed, Will Meredith Leeds, MD  09/30/2017 1:18 PM     [place] [address] [phone] (office) [phone] (fax)

## 2017-10-01 DIAGNOSIS — Z1231 Encounter for screening mammogram for malignant neoplasm of breast: Secondary | ICD-10-CM | POA: Diagnosis not present

## 2017-10-01 LAB — BASIC METABOLIC PANEL
BUN/Creatinine Ratio: 21 (ref 12–28)
BUN: 15 mg/dL (ref 8–27)
CO2: 22 mmol/L (ref 20–29)
Calcium: 9.2 mg/dL (ref 8.7–10.3)
Chloride: 106 mmol/L (ref 96–106)
Creatinine, Ser: 0.7 mg/dL (ref 0.57–1.00)
GFR calc Af Amer: 97 mL/min/{1.73_m2} (ref >59)
GFR calc non Af Amer: 84 mL/min/{1.73_m2} (ref >59)
Glucose: 75 mg/dL (ref 65–99)
Potassium: 4.2 mmol/L (ref 3.5–5.2)
Sodium: 142 mmol/L (ref 134–144)

## 2017-10-01 LAB — CBC
Hematocrit: 39.9 % (ref 34.0–46.6)
Hemoglobin: 13.1 g/dL (ref 11.1–15.9)
MCH: 28.4 pg (ref 26.6–33.0)
MCHC: 32.8 g/dL (ref 31.5–35.7)
MCV: 87 fL (ref 79–97)
Platelets: 161 10*3/uL (ref 150–450)
RBC: 4.61 x10E6/uL (ref 3.77–5.28)
RDW: 14.2 % (ref 12.3–15.4)
WBC: 6.3 10*3/uL (ref 3.4–10.8)

## 2017-10-01 LAB — PRO B NATRIURETIC PEPTIDE: NT-Pro BNP: 417 pg/mL (ref 0–738)

## 2017-10-02 ENCOUNTER — Telehealth: Payer: Self-pay | Admitting: Cardiology

## 2017-10-02 NOTE — Telephone Encounter (Signed)
PPM Lead Revision scheduled for 7/1.  Pt is scheduled for [facility]  w/ rheumatologist next Thursday, 6/27. Dtr calling to confirm ok for pt to continue w/ infusion 4 days prior to lead revision.  Will forward to Dr. Curt Bears for advisement.

## 2017-10-02 NOTE — Telephone Encounter (Signed)
New message    Patient's daughter Otila Kluver calling has questions about her mother getting an infusion next week.  Please call Otila Kluver at [phone]

## 2017-10-03 NOTE — Telephone Encounter (Signed)
Informed pt ok to have infusion prior to lead revision. Patient verbalized understanding and agreeable to plan.

## 2017-10-09 ENCOUNTER — Telehealth: Payer: Self-pay | Admitting: Cardiology

## 2017-10-09 DIAGNOSIS — M0579 Rheumatoid arthritis with rheumatoid factor of multiple sites without organ or systems involvement: Secondary | ICD-10-CM | POA: Diagnosis not present

## 2017-10-09 NOTE — Telephone Encounter (Signed)
New Message:      Pt's daughter is calling and states her mom has had 8-10 episodes of the SOB that she was previously seen for. She is also wanting to see if the results are in from the lab work she did previously. Pt is scheduled for a procedure on 10/13/17 that the daughter is concerned about and has some questions. Please call the daughter to discuss her concerns.

## 2017-10-09 NOTE — Telephone Encounter (Signed)
Dtr reports pt experiencing 8-10 episodes of SOB since she saw [place] last Tuesday, 6/18.  All events were experienced during activity, none occurred at rest. Informed that she should proceed w/ procedure on Monday.  Advised to discuss w/ Dr. Curt Bears Monday to see if he feels further testing is needed or consult to pulmonology for further determination.  She is agreeable to this plan.  Will forward to Dr. Curt Bears to advise on before Monday if he wishes. Otherwise, dtr understands it may be Monday before addressed/advised on.

## 2017-10-10 SURGERY — Surgical Case
Anesthesia: *Unknown

## 2017-10-10 NOTE — Telephone Encounter (Signed)
Agree with plan to evaluate on Monday.

## 2017-10-13 ENCOUNTER — Ambulatory Visit (HOSPITAL_COMMUNITY)
Admission: RE | Admit: 2017-10-13 | Discharge: 2017-10-14 | Disposition: A | Discharge status: Home / Self Care | Payer: Medicare Other | Source: Ambulatory Visit | Attending: Cardiology | Admitting: Cardiology

## 2017-10-13 ENCOUNTER — Other Ambulatory Visit: Payer: Self-pay

## 2017-10-13 ENCOUNTER — Encounter (HOSPITAL_COMMUNITY): Payer: Self-pay | Admitting: Cardiology

## 2017-10-13 ENCOUNTER — Ambulatory Visit (HOSPITAL_COMMUNITY)
Admission: RE | Disposition: A | Discharge status: Home / Self Care | Payer: Self-pay | Source: Ambulatory Visit | Attending: Cardiology

## 2017-10-13 DIAGNOSIS — Y713 Surgical instruments, materials and cardiovascular devices (including sutures) associated with adverse incidents: Secondary | ICD-10-CM | POA: Insufficient documentation

## 2017-10-13 DIAGNOSIS — I440 Atrioventricular block, first degree: Secondary | ICD-10-CM | POA: Insufficient documentation

## 2017-10-13 DIAGNOSIS — I10 Essential (primary) hypertension: Secondary | ICD-10-CM | POA: Diagnosis not present

## 2017-10-13 DIAGNOSIS — Z7982 Long term (current) use of aspirin: Secondary | ICD-10-CM | POA: Insufficient documentation

## 2017-10-13 DIAGNOSIS — I441 Atrioventricular block, second degree: Secondary | ICD-10-CM | POA: Diagnosis present

## 2017-10-13 DIAGNOSIS — Z7902 Long term (current) use of antithrombotics/antiplatelets: Secondary | ICD-10-CM | POA: Insufficient documentation

## 2017-10-13 DIAGNOSIS — Z95818 Presence of other cardiac implants and grafts: Secondary | ICD-10-CM

## 2017-10-13 DIAGNOSIS — I2510 Atherosclerotic heart disease of native coronary artery without angina pectoris: Secondary | ICD-10-CM | POA: Insufficient documentation

## 2017-10-13 DIAGNOSIS — T82110A Breakdown (mechanical) of cardiac electrode, initial encounter: Secondary | ICD-10-CM

## 2017-10-13 HISTORY — DX: Gastro-esophageal reflux disease without esophagitis: K21.9

## 2017-10-13 HISTORY — PX: LEAD REVISION/REPAIR: EP1213

## 2017-10-13 HISTORY — DX: Unspecified osteoarthritis, unspecified site: M19.90

## 2017-10-13 HISTORY — DX: Family history of other specified conditions: Z84.89

## 2017-10-13 HISTORY — DX: Presence of cardiac pacemaker: Z95.0

## 2017-10-13 LAB — SURGICAL PCR SCREEN
MRSA, PCR: NEGATIVE
Staphylococcus aureus: NEGATIVE

## 2017-10-13 SURGERY — LEAD REVISION/REPAIR
Anesthesia: LOCAL

## 2017-10-13 MED ORDER — ACETAMINOPHEN 325 MG PO TABS
325.0000 mg | ORAL_TABLET | ORAL | Status: DC | PRN
  Administered 2017-10-13 – 2017-10-14 (×4): 650 mg via ORAL
  Filled 2017-10-13 (×4): qty 2

## 2017-10-13 MED ORDER — FLUTICASONE PROPIONATE 50 MCG/ACT NA SUSP: 1.0000 | Freq: Every day | NASAL | Status: DC | PRN

## 2017-10-13 MED ORDER — SODIUM CHLORIDE 0.9 % IV SOLN
INTRAVENOUS | Status: DC
  Administered 2017-10-13: 08:00:00 via INTRAVENOUS

## 2017-10-13 MED ORDER — MUPIROCIN 2 % EX OINT
1.0000 "application " | TOPICAL_OINTMENT | Freq: Once | CUTANEOUS | Status: AC
  Administered 2017-10-13: 1 via TOPICAL
  Filled 2017-10-13: qty 22

## 2017-10-13 MED ORDER — LIDOCAINE HCL (PF) 1 % IJ SOLN
INTRAMUSCULAR | Status: DC | PRN
  Administered 2017-10-13: 50 mL

## 2017-10-13 MED ORDER — LORATADINE 10 MG PO TABS
10.0000 mg | ORAL_TABLET | Freq: Every day | ORAL | Status: DC
  Administered 2017-10-13 – 2017-10-14 (×2): 10 mg via ORAL
  Filled 2017-10-13 (×2): qty 1

## 2017-10-13 MED ORDER — CLOPIDOGREL BISULFATE 75 MG PO TABS
75.0000 mg | ORAL_TABLET | Freq: Every day | ORAL | Status: DC
  Administered 2017-10-14: 75 mg via ORAL
  Filled 2017-10-13: qty 1

## 2017-10-13 MED ORDER — ESCITALOPRAM OXALATE 10 MG PO TABS
10.0000 mg | ORAL_TABLET | Freq: Every day | ORAL | Status: DC
  Administered 2017-10-13: 10 mg via ORAL
  Filled 2017-10-13: qty 1

## 2017-10-13 MED ORDER — ASPIRIN EC 81 MG PO TBEC
81.0000 mg | DELAYED_RELEASE_TABLET | ORAL | Status: DC
  Administered 2017-10-13: 81 mg via ORAL
  Filled 2017-10-13: qty 1

## 2017-10-13 MED ORDER — CEFAZOLIN SODIUM-DEXTROSE 2-4 GM/100ML-% IV SOLN
2.0000 g | INTRAVENOUS | Status: AC
  Administered 2017-10-13: 2 g via INTRAVENOUS
  Filled 2017-10-13: qty 100

## 2017-10-13 MED ORDER — LIDOCAINE HCL (PF) 1 % IJ SOLN
INTRAMUSCULAR | Status: AC
  Filled 2017-10-13: qty 60

## 2017-10-13 MED ORDER — MIDAZOLAM HCL 5 MG/5ML IJ SOLN
INTRAMUSCULAR | Status: AC
  Filled 2017-10-13: qty 5

## 2017-10-13 MED ORDER — OXYCODONE HCL 5 MG PO TABS
5.0000 mg | ORAL_TABLET | ORAL | Status: DC | PRN
  Administered 2017-10-13: 5 mg via ORAL
  Filled 2017-10-13: qty 1

## 2017-10-13 MED ORDER — VITAMIN D 1000 UNITS PO TABS
1000.0000 [IU] | ORAL_TABLET | Freq: Every day | ORAL | Status: DC
  Administered 2017-10-13 – 2017-10-14 (×2): 1000 [IU] via ORAL
  Filled 2017-10-13 (×2): qty 1

## 2017-10-13 MED ORDER — CEFAZOLIN SODIUM-DEXTROSE 2-4 GM/100ML-% IV SOLN
INTRAVENOUS | Status: AC
  Filled 2017-10-13: qty 100

## 2017-10-13 MED ORDER — SODIUM CHLORIDE 0.9 % IV SOLN
INTRAVENOUS | Status: AC
  Filled 2017-10-13: qty 2

## 2017-10-13 MED ORDER — ATORVASTATIN CALCIUM 80 MG PO TABS
80.0000 mg | ORAL_TABLET | Freq: Every day | ORAL | Status: DC
  Administered 2017-10-13: 80 mg via ORAL
  Filled 2017-10-13: qty 1

## 2017-10-13 MED ORDER — FENTANYL CITRATE (PF) 100 MCG/2ML IJ SOLN
INTRAMUSCULAR | Status: DC | PRN
  Administered 2017-10-13: 25 ug via INTRAVENOUS
  Administered 2017-10-13: 12.5 ug via INTRAVENOUS

## 2017-10-13 MED ORDER — FENTANYL CITRATE (PF) 100 MCG/2ML IJ SOLN
INTRAMUSCULAR | Status: AC
  Filled 2017-10-13: qty 2

## 2017-10-13 MED ORDER — SODIUM CHLORIDE 0.9 % IV SOLN
80.0000 mg | INTRAVENOUS | Status: AC
  Administered 2017-10-13: 80 mg
  Filled 2017-10-13: qty 2

## 2017-10-13 MED ORDER — CEFAZOLIN SODIUM-DEXTROSE 1-4 GM/50ML-% IV SOLN
1.0000 g | Freq: Four times a day (QID) | INTRAVENOUS | Status: AC
  Administered 2017-10-13 – 2017-10-14 (×3): 1 g via INTRAVENOUS
  Filled 2017-10-13 (×3): qty 50

## 2017-10-13 MED ORDER — ONDANSETRON HCL 4 MG/2ML IJ SOLN: 4.0000 mg | Freq: Four times a day (QID) | INTRAMUSCULAR | Status: DC | PRN

## 2017-10-13 MED ORDER — HEPARIN (PORCINE) IN NACL 1000-0.9 UT/500ML-% IV SOLN
INTRAVENOUS | Status: AC
  Filled 2017-10-13: qty 500

## 2017-10-13 MED ORDER — MIDAZOLAM HCL 5 MG/5ML IJ SOLN
INTRAMUSCULAR | Status: DC | PRN
  Administered 2017-10-13 (×3): 1 mg via INTRAVENOUS

## 2017-10-13 MED ORDER — ABATACEPT 250 MG IV SOLR: 250.0000 mg | INTRAVENOUS | Status: DC

## 2017-10-13 MED ORDER — MUPIROCIN 2 % EX OINT
TOPICAL_OINTMENT | CUTANEOUS | Status: AC
  Administered 2017-10-13: 1 via TOPICAL
  Filled 2017-10-13: qty 22

## 2017-10-13 SURGICAL SUPPLY — 5 items
CABLE SURGICAL S-101-97-12 (CABLE) ×2 IMPLANT
LEAD CAPSURE NOVUS 5076-58CM (Lead) ×2 IMPLANT
PAD DEFIB LIFELINK (PAD) ×2 IMPLANT
SHEATH CLASSIC 7F (SHEATH) ×2 IMPLANT
TRAY PACEMAKER INSERTION (PACKS) ×2 IMPLANT

## 2017-10-13 NOTE — H&P (Signed)
Elizabeth Estrada has presented today for surgery, with the diagnosis of RV lead dysfunction.  The various methods of treatment have been discussed with the patient and family. After consideration of risks, benefits and other options for treatment, the patient has consented to  Procedure(s): RV lead revision as a surgical intervention .  Risks include but not limited to bleeding, tamponade, infection, pneumothorax, among others. The patient's history has been reviewed, patient examined, no change in status, stable for surgery.  I have reviewed the patient's chart and labs.  Questions were answered to the patient's satisfaction.    Will Curt Bears, MD 10/13/2017 9:47 AM

## 2017-10-14 ENCOUNTER — Ambulatory Visit (HOSPITAL_COMMUNITY): Payer: Medicare Other

## 2017-10-14 DIAGNOSIS — I441 Atrioventricular block, second degree: Secondary | ICD-10-CM

## 2017-10-14 DIAGNOSIS — T82110A Breakdown (mechanical) of cardiac electrode, initial encounter: Secondary | ICD-10-CM | POA: Diagnosis not present

## 2017-10-14 DIAGNOSIS — Z7902 Long term (current) use of antithrombotics/antiplatelets: Secondary | ICD-10-CM | POA: Diagnosis not present

## 2017-10-14 DIAGNOSIS — J9811 Atelectasis: Secondary | ICD-10-CM | POA: Diagnosis not present

## 2017-10-14 DIAGNOSIS — I10 Essential (primary) hypertension: Secondary | ICD-10-CM | POA: Diagnosis not present

## 2017-10-14 DIAGNOSIS — I440 Atrioventricular block, first degree: Secondary | ICD-10-CM | POA: Diagnosis not present

## 2017-10-14 DIAGNOSIS — Z7982 Long term (current) use of aspirin: Secondary | ICD-10-CM | POA: Diagnosis not present

## 2017-10-14 DIAGNOSIS — I2510 Atherosclerotic heart disease of native coronary artery without angina pectoris: Secondary | ICD-10-CM | POA: Diagnosis not present

## 2017-10-14 MED FILL — Heparin Sod (Porcine)-NaCl IV Soln 1000 Unit/500ML-0.9%: INTRAVENOUS | Qty: 500 | Status: AC

## 2017-10-14 NOTE — Discharge Summary (Addendum)
ELECTROPHYSIOLOGY PROCEDURE DISCHARGE SUMMARY    Patient ID: Elizabeth Estrada,  MRN: [id number], DOB/AGE: [birth year] 77 y.o.  Admit date: 10/13/2017 Discharge date: 10/14/2017  Primary Care Physician: [name], MD  Primary Cardiologist: Dr. [place] Electrophysiologist: Dr. Curt Bears  Primary Discharge Diagnosis:  1. RV lead failure 2. CHB  Secondary Discharge Diagnosis:  1. CAD 2. HTN 3. RA   Allergies  Allergen Reactions  . Methotrexate Derivatives Itching and Swelling  . Plaquenil [Hydroxychloroquine Sulfate] Other (See Comments)    Unknown  . Remicade [Infliximab] Nausea And Vomiting and Swelling  . Ridaura [Auranofin] Other (See Comments)    unknown  . Sudafed [Pseudoephedrine] Other (See Comments)    nervous     Procedures This Admission:  1.  Implantation of a new PPM RV lead with extraction of non-functioning old RV lead on 10/13/17 by Dr Curt Bears.  The patient received a Medtronic model [id number] (serial number [id number]) right ventricular lead  There were no immediate post procedure complications. 2.  CXR on 10/14/17 demonstrated no pneumothorax status post device implantation.   Brief HPI: Elizabeth Estrada is a 77 y.o. female is followed by EP in the outpatient setting for for management of her PPM, noting increasing RV lead thresholds, was recommended new RV lead placement with removal of old lead (implanted 08/11/17).  Past medical history includes above.  Risks, benefits, and alternatives to planned procedure were reviewed with the patient who wished to proceed.   Hospital Course:  The patient was admitted and underwent implantation of a new PPM RV lead with removal of the old lead with details as outlined above. She was monitored on telemetry overnight which demonstrated SR, A and V pacing intermittently.   Left chest was without hematoma or ecchymosis.  The device was interrogated and found to be functioning normally.  CXR was obtained and demonstrated no  pneumothorax status post device implantation.  Wound care, arm mobility, and restrictions were reviewed with the patient.  The patient feels well this morning, no c/o CP or SOB, she was examined by Dr. Curt Bears and considered stable for discharge to home.    Physical Exam: Vitals:   10/13/17 2332 10/14/17 0318 10/14/17 0630 10/14/17 0740  BP:  (!) 119/59  127/72  Pulse:  (!) 59    Resp: 17 11 15 15   Temp:  (!) 96 F (35.6 C)  97.6 F (36.4 C)  TempSrc:  Oral  Oral  SpO2: 97% 97%    Weight:      Height:        GEN- The patient is well appearing, alert and oriented x 3 today.   HEENT: normocephalic, atraumatic; sclera clear, conjunctiva pink; hearing intact; oropharynx clear; neck supple, no JVP Lungs- CTA b/l, normal work of breathing.  No wheezes, rales, rhonchi Heart-  RRR, no murmurs, rubs or gallops, PMI not laterally displaced GI- soft, non-tender, non-distended Extremities- no clubbing, cyanosis, or edema MS- no significant deformity or atrophy Skin- warm and dry, no rash or lesion,  left chest without hematoma/ecchymosis Psych- euthymic mood, full affect Neuro- no gross deficits   Labs:   Lab Results  Component Value Date   WBC 6.3 09/30/2017   HGB 13.1 09/30/2017   HCT 39.9 09/30/2017   MCV 87 09/30/2017   PLT 161 09/30/2017   No results for input(s): NA, K, CL, CO2, BUN, CREATININE, CALCIUM, PROT, BILITOT, ALKPHOS, ALT, AST, GLUCOSE in the last 168 hours.  Invalid input(s): LABALBU  Discharge Medications:  Allergies as of 10/14/2017      Reactions   Methotrexate Derivatives Itching, Swelling   Plaquenil [hydroxychloroquine Sulfate] Other (See Comments)   Unknown   Remicade [infliximab] Nausea And Vomiting, Swelling   Ridaura [auranofin] Other (See Comments)   unknown   Sudafed [pseudoephedrine] Other (See Comments)   nervous      Medication List    TAKE these medications   aspirin 81 MG tablet Take 81 mg by mouth every other day.   atorvastatin 80 MG  tablet Commonly known as:  LIPITOR Take 1 tablet (80 mg total) by mouth daily at 6 PM.   cetirizine 10 MG tablet Commonly known as:  ZYRTEC Take 10 mg by mouth daily as needed for allergies.   cholecalciferol 1000 units tablet Commonly known as:  VITAMIN D Take 1,000 Units by mouth daily.   clopidogrel 75 MG tablet Commonly known as:  PLAVIX Take 1 tablet (75 mg total) by mouth daily with breakfast.   escitalopram 10 MG tablet Commonly known as:  LEXAPRO Take 10 mg by mouth at bedtime.   fluticasone 50 MCG/ACT nasal spray Commonly known as:  FLONASE Place 1-2 sprays into both nostrils daily as needed for allergies or rhinitis.   ORENCIA 250 MG injection Generic drug:  abatacept Inject 250 mg into the vein every 30 (thirty) days.   REFRESH OP Place 1-2 drops into both eyes daily as needed (for dry eyes).       Disposition:  Home  Discharge Instructions    Diet - low sodium heart healthy   Complete by:  As directed    Increase activity slowly   Complete by:  As directed      Follow-up Information    [place] [facility] Follow up on 10/23/2017.   Specialty:  Cardiology Why:  11:30AM, wound check visit Contact information: [address] [place]       [name], MD Follow up on 01/19/2018.   Specialty:  Cardiology Why:  11:00AM Contact information: [place] [address] [phone]           Duration of Discharge Encounter: Greater than 30 minutes including physician time.  Signed, Tommye Standard, PA-C 10/14/2017 9:10 AM  I have seen and examined this patient with Tommye Standard.  Agree with above, note added to reflect my findings.  On exam, RRR, no murmurs, lungs clear.  Patient is status post RV lead revision.  Interrogation and chest x-ray this morning without issues.  Plan for discharge today with follow-up in device clinic.  Will M. Camnitz  MD 10/14/2017 10:06 AM

## 2017-10-14 NOTE — Progress Notes (Signed)
Discharge instructions (including medications) discussed with and copy provided to patient/caregiver. All belongings sent with patient. 

## 2017-10-14 NOTE — Discharge Instructions (Signed)
° ° °  Supplemental Discharge Instructions for  Pacemaker/Defibrillator Patients  Activity No heavy lifting or vigorous activity with your left/right arm for 6 to 8 weeks.  Do not raise your left/right arm above your head for one week.  Gradually raise your affected arm as drawn below.              10/17/17                      10/18/17                      10/19/17                     10/20/17 __  NO DRIVING for  1 week  ; you may begin driving on  12/19/26  .  WOUND CARE - Keep the wound area clean and dry.  Do not get this area wet, no showers until cleared to at your wound check visit  . - The tape/steri-strips on your wound will fall off; do not pull them off.  No bandage is needed on the site.  DO  NOT apply any creams, oils, or ointments to the wound area. - If you notice any drainage or discharge from the wound, any swelling or bruising at the site, or you develop a fever > 101? F after you are discharged home, call the office at once.  Special Instructions - You are still able to use cellular telephones; use the ear opposite the side where you have your pacemaker/defibrillator.  Avoid carrying your cellular phone near your device. - When traveling through airports, show security personnel your identification card to avoid being screened in the metal detectors.  Ask the security personnel to use the hand wand. - Avoid arc welding equipment, MRI testing (magnetic resonance imaging), TENS units (transcutaneous nerve stimulators).  Call the office for questions about other devices. - Avoid electrical appliances that are in poor condition or are not properly grounded. - Microwave ovens are safe to be near or to operate.  Additional information for defibrillator patients should your device go off: - If your device goes off ONCE and you feel fine afterward, notify the device clinic nurses. - If your device goes off ONCE and you do not feel well afterward, call 911. - If your device goes off TWICE,  call 911. - If your device goes off THREE times in one day, call 911.  DO NOT DRIVE YOURSELF OR A FAMILY MEMBER WITH A DEFIBRILLATOR TO THE HOSPITAL--CALL 911.

## 2017-10-15 ENCOUNTER — Telehealth: Payer: Self-pay | Admitting: Cardiology

## 2017-10-15 NOTE — Telephone Encounter (Signed)
New Message   Pt's daughter is calling, states that when the pt had her lead wire revision on pacemaker she mentioned to Dr. Curt Bears that the pt was having SOB and was advised to contact [place]. Duaghter states the pt had 8-10 episodes of SOB a couple weeks ago but is fine now so would like to know if she should get a sooner appt of wait. Please call

## 2017-10-15 NOTE — Telephone Encounter (Signed)
Returned call to patient's daughter of Dr. [place]. She had a lead revision on July 1 with Dr. Curt Bears and they mentioned to him about episodic SOB that occurs w/exertion and at rest that last for seconds. Per daughter, when she first was diagnosed with heart block her only symptoms were SOB. Daughter states she has had SOB since getting pacemaker & stent and episodes are more frequent and longer in duration than prior to stent/pacemaker, which concerns daughter. Per daughter, Dr. Curt Bears felt her symptoms were not r/t her lead. She reports her labs were OK. Dr. Curt Bears suggested possibly a stress test, pulmonary referral, and then advised to call Dr. Doug Sou office.  Patient is not scheduled to see Dr. Martinique until Sept. Daughter would like to know if patient needs to be seen sooner or needs additional testing or referral.   Routed to MD.

## 2017-10-17 NOTE — Telephone Encounter (Signed)
I would just recommend she follow up for office visit sooner. She had a stent in April and now pacemaker so those issues have been addressed. We could consider a trial of diuretic with evidence of diastolic dysfunction at cath but I would like her seen sooner. She should continue with cardiac Rehab to work on conditioning.   Peter Martinique MD, [facility]

## 2017-10-17 NOTE — Telephone Encounter (Signed)
Patient's daughter Otila Kluver returned call. She states patient prefers to wait a little while longer since she just had procedure done and evaluate symptoms. She has been scheduled the week of August 5th (on August 6) per her request to see [facility] PA @ 130pm

## 2017-10-17 NOTE — Telephone Encounter (Signed)
Returned call to daughter and made her aware of MD recommendations. She is unable to check calendar at this time and will call back. Advised I will tentatively schedule her mother for Monday July 8 @ 10am with Lurena Joiner PA

## 2017-10-20 ENCOUNTER — Ambulatory Visit: Payer: Medicare Other | Admitting: Cardiology

## 2017-10-23 ENCOUNTER — Ambulatory Visit (INDEPENDENT_AMBULATORY_CARE_PROVIDER_SITE_OTHER): Payer: Medicare Other | Admitting: *Deleted

## 2017-10-23 ENCOUNTER — Other Ambulatory Visit: Payer: Self-pay | Admitting: *Deleted

## 2017-10-23 DIAGNOSIS — Z950 Presence of cardiac pacemaker: Secondary | ICD-10-CM | POA: Diagnosis not present

## 2017-10-23 DIAGNOSIS — I442 Atrioventricular block, complete: Secondary | ICD-10-CM

## 2017-10-23 LAB — CUP PACEART INCLINIC DEVICE CHECK
Battery Remaining Longevity: 138 mo
Battery Voltage: 3.08 V
Brady Statistic AP VP Percent: 31.57 %
Brady Statistic AP VS Percent: 0.01 %
Brady Statistic AS VP Percent: 68.34 %
Brady Statistic AS VS Percent: 0.07 %
Brady Statistic RA Percent Paced: 31.53 %
Brady Statistic RV Percent Paced: 99.92 %
Date Time Interrogation Session: 20190711142444
Implantable Lead Implant Date: 20190429
Implantable Lead Implant Date: 20190429
Implantable Lead Location: 753859
Implantable Lead Location: 753860
Implantable Lead Model: 5076
Implantable Lead Model: 5076
Implantable Pulse Generator Implant Date: 20190429
Lead Channel Impedance Value: 323 Ohm
Lead Channel Impedance Value: 342 Ohm
Lead Channel Impedance Value: 551 Ohm
Lead Channel Impedance Value: 551 Ohm
Lead Channel Pacing Threshold Amplitude: 1 V
Lead Channel Pacing Threshold Amplitude: 1 V
Lead Channel Pacing Threshold Pulse Width: 0.4 ms
Lead Channel Pacing Threshold Pulse Width: 0.4 ms
Lead Channel Sensing Intrinsic Amplitude: 22.375 mV
Lead Channel Sensing Intrinsic Amplitude: 3.375 mV
Lead Channel Setting Pacing Amplitude: 2 V
Lead Channel Setting Pacing Amplitude: 3.5 V
Lead Channel Setting Pacing Pulse Width: 0.4 ms
Lead Channel Setting Sensing Sensitivity: 1.2 mV

## 2017-10-23 NOTE — Progress Notes (Signed)
Wound check appointment, s/p RV lead revision. Steri-strips removed. Wound without redness or edema. Incision edges approximated and healing well. Patient aware to call if any signs/symptoms of infection are noted. Normal device function. Thresholds, sensing, and impedances consistent with implant measurements. RV output at 3.5V with auto capture programmed on for extra safety margin until 3 month visit; RA output at chronic settings. Histogram distribution appropriate for patient and level of activity. No mode switches or high ventricular rates noted. Patient educated about wound care, arm mobility, lifting restrictions, and [facility] app. Patient's daughter on speaker phone during appt (per patient request) and denied any additional questions. ROV with WC on 01/19/18.

## 2017-10-23 NOTE — Telephone Encounter (Signed)
Opened in error

## 2017-10-30 ENCOUNTER — Telehealth (HOSPITAL_COMMUNITY): Payer: Self-pay

## 2017-10-31 NOTE — Telephone Encounter (Signed)
Cardiac Rehab Medication Review by a Pharmacist  Does the patient  feel that his/her medications are working for him/her?  yes  Has the patient been experiencing any side effects to the medications prescribed?  no  Does the patient measure his/her own blood pressure or blood glucose at home?  no   Does the patient have any problems obtaining medications due to transportation or finances?   no  Understanding of regimen: good Understanding of indications: good Potential of compliance: good    Pharmacist comments:  Patient has a PMH of CAD, HTN, and complete heart block s/p Medtronic dual-chamber pacemaker implant  This patient may benefit from the use of an ACE-inhibitor or ARB. Please consider use.  Vertis Kelch, PharmD PGY1 Pharmacy Resident Phone [phone] 10/31/2017       3:52 PM

## 2017-11-04 ENCOUNTER — Telehealth (HOSPITAL_COMMUNITY): Payer: Self-pay | Admitting: Cardiac Rehabilitation

## 2017-11-04 NOTE — Telephone Encounter (Signed)
-----   Message from Will Meredith Leeds, MD sent at 11/04/2017  4:01 PM EDT ----- Regarding: RE: cardiac rehab  At this point, she can use her legs at rehab. She can use her right arm, but will have restrictions on her left arm for 6 weeks from vigorous work since lead revision. Thanks.  Will Camnitz  ----- Message ----- From: Lowell Guitar, RN Sent: 11/04/2017  10:13 AM To: [name], MD Subject: cardiac rehab                                  Dear Dr. Curt Bears,  Pt is s/p RV lead change on 10/13/17.  She is scheduled to begin cardiac rehab this week.   Is this appropriate for her to start cardiac rehab at this time?  Are there any activity restrictions?  Thank you, Andi Hence, RN, BSN Cardiac Pulmonary Rehab

## 2017-11-05 ENCOUNTER — Encounter: Payer: Self-pay | Admitting: Obstetrics and Gynecology

## 2017-11-06 ENCOUNTER — Encounter (HOSPITAL_COMMUNITY)
Admission: RE | Admit: 2017-11-06 | Discharge: 2017-11-06 | Disposition: A | Discharge status: Home / Self Care | Payer: Medicare Other | Source: Ambulatory Visit | Attending: Cardiology | Admitting: Cardiology

## 2017-11-06 ENCOUNTER — Encounter (HOSPITAL_COMMUNITY): Payer: Self-pay

## 2017-11-06 VITALS — BP 118/72 | HR 78 | Ht 62.75 in | Wt 117.5 lb

## 2017-11-06 DIAGNOSIS — Z955 Presence of coronary angioplasty implant and graft: Secondary | ICD-10-CM | POA: Insufficient documentation

## 2017-11-06 DIAGNOSIS — M0579 Rheumatoid arthritis with rheumatoid factor of multiple sites without organ or systems involvement: Secondary | ICD-10-CM | POA: Diagnosis not present

## 2017-11-06 NOTE — Progress Notes (Addendum)
Elizabeth Estrada 77 y.o. female DOB: [birth year] MRN: [id number]      Nutrition Note  1. 08/08/2017 Stented coronary artery    Past Medical History:  Diagnosis Date  . 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 Chronic depression   . Coronary artery disease   . Family history of adverse reaction to anesthesia    "dad had PONV"  . Fibromyalgia   . GERD (gastroesophageal reflux disease)   . Heart murmur   . History of kidney stones   . Hypertension    "before stent and pacemaker" (10/13/2017)  . Melanoma ([place])    removed from back  . MVP (mitral valve prolapse)   . Osteoarthritis   . Osteoporosis   . 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 RA (rheumatoid arthritis) ([place])    "all over; mainly hands and knees" (10/13/2017)  . Rib fracture    Meds reviewed. Vitamin D, lipitor noted  HT: Ht Readings from Last 1 Encounters:  10/13/17 5\' 2"  (1.575 m)    WT: Wt Readings from Last 5 Encounters:  10/13/17 118 lb (53.5 kg)  09/30/17 118 lb 12.8 oz (53.9 kg)  09/22/17 118 lb (53.5 kg)  09/03/17 118 lb 9.6 oz (53.8 kg)  08/09/17 121 lb 14.4 oz (55.3 kg)     There is no height or weight on file to calculate BMI.   Current tobacco use? no       Labs:  Lipid Panel     Component Value Date/Time   CHOL 227 (H) 07/14/2017 1214   TRIG 43 07/14/2017 1214   HDL 91 07/14/2017 1214   CHOLHDL 2.5 07/14/2017 1214   LDLCALC 127 (H) 07/14/2017 1214    No results found for: HGBA1C CBG (last 3)  No results for input(s): GLUCAP in the last 72 hours.  Nutrition Note Spoke with pt. Nutrition plan and goals reviewed with pt. Pt is following Step 2 of the Therapeutic Lifestyle Changes diet. Pt wants to maintain weight, is happy with her body weight currently. Per discussion, pt does not use canned/convenience foods often. Pt rarely adds salt to food. Pt eats out infrequently. Since blood lipids are elevated additionally discussed with patient a goal to identify  and limit food sources of saturated fat, trans fat. Pt expressed understanding of the information reviewed. Pt aware of nutrition education classes offered and plans on attending nutrition classes.  Nutrition Diagnosis ? Food-and nutrition-related knowledge deficit related to lack of exposure to information as related to diagnosis of: ? CVD   Nutrition Intervention ? Pt's individual nutrition plan and goals reviewed with pt.  Nutrition Goal(s):   ? Pt to identify and limit food sources of saturated fat, trans fat, and sodium ? Pt to eat one more serving of non-starchy vegetables q day    Plan:  ? Pt to attend nutrition classes ? Nutrition I ? Nutrition II ? Portion Distortion  ? Will provide client-centered nutrition education as part of interdisciplinary care ? Monitor and evaluate progress toward nutrition goal with team.   Laurina Bustle, MS, RD, LDN 11/06/2017 7:28 AM

## 2017-11-06 NOTE — Progress Notes (Signed)
Cardiac Individual Treatment Plan  Patient Details  Name: Elizabeth Estrada MRN: [id number] Date of Birth: [birth year] Referring Provider:     CARDIAC REHAB PHASE II ORIENTATION from 11/06/2017 in [place]  Referring Provider  Martinique, Peter MD      Initial Encounter Date:    CARDIAC REHAB PHASE II ORIENTATION from 11/06/2017 in [place]  Date  11/06/17      Visit Diagnosis: 08/08/2017 Stented coronary artery, S/P DES RCA  Patient's Home Medications on Admission:  Current Outpatient Medications:  .  abatacept (ORENCIA) 250 MG injection, Inject 250 mg into the vein every 30 (thirty) days. , Disp: , Rfl:  .  aspirin 81 MG tablet, Take 81 mg by mouth every other day. , Disp: , Rfl:  .  atorvastatin (LIPITOR) 80 MG tablet, Take 1 tablet (80 mg total) by mouth daily at 6 PM., Disp: 30 tablet, Rfl: 5 .  cetirizine (ZYRTEC) 10 MG tablet, Take 10 mg by mouth daily as needed for allergies., Disp: , Rfl:  .  cholecalciferol (VITAMIN D) 1000 UNITS tablet, Take 1,000 Units by mouth daily., Disp: , Rfl:  .  clopidogrel (PLAVIX) 75 MG tablet, Take 1 tablet (75 mg total) by mouth daily with breakfast., Disp: 30 tablet, Rfl: 11 .  escitalopram (LEXAPRO) 10 MG tablet, Take 10 mg by mouth at bedtime. , Disp: , Rfl:  .  fluticasone (FLONASE) 50 MCG/ACT nasal spray, Place 1-2 sprays into both nostrils daily as needed for allergies or rhinitis., Disp: , Rfl:  .  Polyvinyl Alcohol-Povidone (REFRESH OP), Place 1-2 drops into both eyes daily as needed (for dry eyes)., Disp: , Rfl:   Past Medical History: Past Medical History:  Diagnosis Date  . 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 Chronic depression   . Coronary artery disease   . Family history of adverse reaction to anesthesia    "dad had PONV"  . Fibromyalgia   . GERD (gastroesophageal reflux disease)   . Heart murmur   . History of kidney stones   .  Hypertension    "before stent and pacemaker" (10/13/2017)  . Melanoma ([place])    removed from back  . MVP (mitral valve prolapse)   . Osteoarthritis   . Osteoporosis   . 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 RA (rheumatoid arthritis) ([place])    "all over; mainly hands and knees" (10/13/2017)  . Rib fracture     Tobacco Use: Social History   Tobacco Use  Smoking Status Never Smoker  Smokeless Tobacco Never Used    Labs: Recent Review Flowsheet Data    Labs for ITP Cardiac and Pulmonary Rehab Latest Ref Rng & Units 07/14/2017 08/08/2017 08/08/2017   Cholestrol 100 - 199 mg/dL 227(H) - -   LDLCALC 0 - 99 mg/dL 127(H) - -   HDL >39 mg/dL 91 - -   Trlycerides 0 - 149 mg/dL 43 - -   PHART 7.350 - 7.450 - - 7.333(L)   PCO2ART 32.0 - 48.0 mmHg - - 42.1   HCO3 20.0 - 28.0 mmol/L - 21.9 22.3   TCO2 22 - 32 mmol/L - 23 24   ACIDBASEDEF 0.0 - 2.0 mmol/L - 5.0(H) 3.0(H)   O2SAT % - 64.0 93.0      Capillary Blood Glucose: No results found for: GLUCAP   Exercise Target Goals: Date: 11/06/17  Exercise Program Goal: Individual exercise  prescription set using results from initial 6 min walk test and THRR while considering  patient's activity barriers and safety.   Exercise Prescription Goal: Initial exercise prescription builds to 30-45 minutes a day of aerobic activity, 2-3 days per week.  Home exercise guidelines will be given to patient during program as part of exercise prescription that the participant will acknowledge.  Activity Barriers & Risk Stratification: Activity Barriers & Cardiac Risk Stratification - 11/06/17 1436      Activity Barriers & Cardiac Risk Stratification   Activity Barriers  Joint Problems;Deconditioning;Muscular Weakness;Shortness of Breath;Balance Concerns;History of Falls;Other (comment);Arthritis    Comments  Rheumatoid arthritis    Cardiac Risk Stratification  High       6 Minute Walk: 6 Minute Walk    Row Name 11/06/17 1411 11/06/17  1435 11/06/17 1452     6 Minute Walk   Phase  -  Initial  -   Distance  -  1278 feet  -   Walk Time  -  6 minutes  -   # of Rest Breaks  -  0  -   MPH  -  2.4  -   METS  -  2.6  -   RPE  -  11  -   VO2 Peak  -  9.1  -   Symptoms  -  No  -   Resting HR  -  78 bpm  -   Resting BP  -  118/72  -   Resting Oxygen Saturation   -  99 %  -   Exercise Oxygen Saturation  during 6 min walk  -  100 %  -   Max Ex. HR  -  86 bpm  -   Max Ex. BP  -  124/76  -   2 Minute Post BP  -  -  102/60     Interval Oxygen   Interval Oxygen?  Yes  -  -      Oxygen Initial Assessment:   Oxygen Re-Evaluation:   Oxygen Discharge (Final Oxygen Re-Evaluation):   Initial Exercise Prescription: Initial Exercise Prescription - 11/06/17 1300      Date of Initial Exercise RX and Referring Provider   Date  11/06/17    Referring Provider  Martinique, Peter MD      Treadmill   MPH  1.8    Grade  0    Minutes  10    METs  2.38      Recumbant Bike   Level  1.5    Minutes  10    METs  1.8      NuStep   Level  2    SPM  80    Minutes  10    METs  1.8      Prescription Details   Frequency (times per week)  3    Duration  Progress to 30 minutes of continuous aerobic without signs/symptoms of physical distress      Intensity   THRR 40-80% of Max Heartrate  58-115    Ratings of Perceived Exertion  11-13    Perceived Dyspnea  0-4      Progression   Progression  Continue to progress workloads to maintain intensity without signs/symptoms of physical distress.      Resistance Training   Training Prescription  Yes    Weight  2lbs    Reps  10-15       Perform Capillary Blood Glucose checks as needed.  Exercise Prescription Changes:   Exercise Comments:   Exercise Goals and Review: Exercise Goals    Row Name 11/06/17 1412             Exercise Goals   Increase Physical Activity  Yes       Intervention  Provide advice, education, support and counseling about physical  activity/exercise needs.;Develop an individualized exercise prescription for aerobic and resistive training based on initial evaluation findings, risk stratification, comorbidities and participant's personal goals.       Expected Outcomes  Short Term: Attend rehab on a regular basis to increase amount of physical activity.;Long Term: Exercising regularly at least 3-5 days a week.;Long Term: Add in home exercise to make exercise part of routine and to increase amount of physical activity.       Increase Strength and Stamina  Yes improve functional fitness       Intervention  Provide advice, education, support and counseling about physical activity/exercise needs.;Develop an individualized exercise prescription for aerobic and resistive training based on initial evaluation findings, risk stratification, comorbidities and participant's personal goals.       Expected Outcomes  Short Term: Increase workloads from initial exercise prescription for resistance, speed, and METs.;Short Term: Perform resistance training exercises routinely during rehab and add in resistance training at home;Long Term: Improve cardiorespiratory fitness, muscular endurance and strength as measured by increased METs and functional capacity (6MWT)       Able to understand and use rate of perceived exertion (RPE) scale  Yes       Intervention  Provide education and explanation on how to use RPE scale       Expected Outcomes  Short Term: Able to use RPE daily in rehab to express subjective intensity level;Long Term:  Able to use RPE to guide intensity level when exercising independently       Knowledge and understanding of Target Heart Rate Range (THRR)  Yes       Intervention  Provide education and explanation of THRR including how the numbers were predicted and where they are located for reference       Expected Outcomes  Short Term: Able to state/look up THRR;Short Term: Able to use daily as guideline for intensity in rehab;Long Term:  Able to use THRR to govern intensity when exercising independently       Able to check pulse independently  Yes       Intervention  Provide education and demonstration on how to check pulse in carotid and radial arteries.;Review the importance of being able to check your own pulse for safety during independent exercise       Expected Outcomes  Short Term: Able to explain why pulse checking is important during independent exercise;Long Term: Able to check pulse independently and accurately       Understanding of Exercise Prescription  Yes       Intervention  Provide education, explanation, and written materials on patient's individual exercise prescription       Expected Outcomes  Short Term: Able to explain program exercise prescription;Long Term: Able to explain home exercise prescription to exercise independently          Exercise Goals Re-Evaluation :    Discharge Exercise Prescription (Final Exercise Prescription Changes):   Nutrition:  Target Goals: Understanding of nutrition guidelines, daily intake of sodium 1500mg , cholesterol 200mg , calories 30% from fat and 7% or less from saturated fats, daily to have 5 or more servings of fruits and vegetables.  Biometrics: Pre  Biometrics - 11/06/17 1452      Pre Biometrics   Height  5' 2.75" (1.594 m)    Weight  117 lb 8.1 oz (53.3 kg)    Waist Circumference  31 inches    Hip Circumference  40.5 inches    Waist to Hip Ratio  0.77 %    BMI (Calculated)  20.98    Triceps Skinfold  23 mm    % Body Fat  33.9 %    Grip Strength  18 kg    Flexibility  13.5 in    Single Leg Stand  8.34 seconds        Nutrition Therapy Plan and Nutrition Goals: Nutrition Therapy & Goals - 11/06/17 0743      Nutrition Therapy   Diet  heart healthy      Personal Nutrition Goals   Nutrition Goal  pt to identify and limit food sources of saturated fat, trans fat, sodium    Personal Goal #2  --    Personal Goal #3  --      Intervention Plan    Intervention  Prescribe, educate and counsel regarding individualized specific dietary modifications aiming towards targeted core components such as weight, hypertension, lipid management, diabetes, heart failure and other comorbidities.    Expected Outcomes  Short Term Goal: Understand basic principles of dietary content, such as calories, fat, sodium, cholesterol and nutrients.       Nutrition Assessments: Nutrition Assessments - 11/06/17 0756      MEDFICTS Scores   Pre Score  --       Nutrition Goals Re-Evaluation:   Nutrition Goals Re-Evaluation:   Nutrition Goals Discharge (Final Nutrition Goals Re-Evaluation):   Psychosocial: Target Goals: Acknowledge presence or absence of significant depression and/or stress, maximize coping skills, provide positive support system. Participant is able to verbalize types and ability to use techniques and skills needed for reducing stress and depression.  Initial Review & Psychosocial Screening: Initial Psych Review & Screening - 11/06/17 1534      Initial Review   Current issues with  None Identified;History of Depression      Family Dynamics   Good Support System?  Yes Corretta has her husband, daughter and sisters for support      Barriers   Psychosocial barriers to participate in program  There are no identifiable barriers or psychosocial needs.      Screening Interventions   Interventions  Encouraged to exercise    Expected Outcomes  Long Term Goal: Stressors or current issues are controlled or eliminated.;Short Term goal: Identification and review with participant of any Quality of Life or Depression concerns found by scoring the questionnaire.       Quality of Life Scores: Quality of Life - 11/06/17 1342      Quality of Life   Select  Quality of Life      Quality of Life Scores   Health/Function Pre  19.25 %    Socioeconomic Pre  22 %    Psych/Spiritual Pre  22.93 %    Family Pre  25.2 %    GLOBAL Pre  21.53 %       Scores of 19 and below usually indicate a poorer quality of life in these areas.  A difference of  2-3 points is a clinically meaningful difference.  A difference of 2-3 points in the total score of the Quality of Life Index has been associated with significant improvement in overall quality of life, self-image, physical symptoms, and  general health in studies assessing change in quality of life.  PHQ-9: Recent Review Flowsheet Data    There is no flowsheet data to display.     Interpretation of Total Score  Total Score Depression Severity:  1-4 = Minimal depression, 5-9 = Mild depression, 10-14 = Moderate depression, 15-19 = Moderately severe depression, 20-27 = Severe depression   Psychosocial Evaluation and Intervention:   Psychosocial Re-Evaluation:   Psychosocial Discharge (Final Psychosocial Re-Evaluation):   Vocational Rehabilitation: Provide vocational rehab assistance to qualifying candidates.   Vocational Rehab Evaluation & Intervention: Vocational Rehab - 11/06/17 1532      Initial Vocational Rehab Evaluation & Intervention   Assessment shows need for Vocational Rehabilitation  No Glenisha is retired and does not need vocational rehab at this time       Education: Education Goals: Education classes will be provided on a weekly basis, covering required topics. Participant will state understanding/return demonstration of topics presented.  Learning Barriers/Preferences: Learning Barriers/Preferences - 11/06/17 1406      Learning Barriers/Preferences   Learning Barriers  Hearing    Learning Preferences  Skilled Demonstration;Video;Pictoral;Written Material       Education Topics: Count Your Pulse:  -Group instruction provided by verbal instruction, demonstration, patient participation and written materials to support subject.  Instructors address importance of being able to find your pulse and how to count your pulse when at home without a heart monitor.   Patients get hands on experience counting their pulse with staff help and individually.   Heart Attack, Angina, and Risk Factor Modification:  -Group instruction provided by verbal instruction, video, and written materials to support subject.  Instructors address signs and symptoms of angina and heart attacks.    Also discuss risk factors for heart disease and how to make changes to improve heart health risk factors.   Functional Fitness:  -Group instruction provided by verbal instruction, demonstration, patient participation, and written materials to support subject.  Instructors address safety measures for doing things around the house.  Discuss how to get up and down off the floor, how to pick things up properly, how to safely get out of a chair without assistance, and balance training.   Meditation and Mindfulness:  -Group instruction provided by verbal instruction, patient participation, and written materials to support subject.  Instructor addresses importance of mindfulness and meditation practice to help reduce stress and improve awareness.  Instructor also leads participants through a meditation exercise.    Stretching for Flexibility and Mobility:  -Group instruction provided by verbal instruction, patient participation, and written materials to support subject.  Instructors lead participants through series of stretches that are designed to increase flexibility thus improving mobility.  These stretches are additional exercise for major muscle groups that are typically performed during regular warm up and cool down.   Hands Only CPR:  -Group verbal, video, and participation provides a basic overview of AHA guidelines for community CPR. Role-play of emergencies allow participants the opportunity to practice calling for help and chest compression technique with discussion of AED use.   Hypertension: -Group verbal and written instruction that provides a basic overview of hypertension  including the most recent diagnostic guidelines, risk factor reduction with self-care instructions and medication management.    Nutrition I class: Heart Healthy Eating:  -Group instruction provided by PowerPoint slides, verbal discussion, and written materials to support subject matter. The instructor gives an explanation and review of the Therapeutic Lifestyle Changes diet recommendations, which includes a discussion on lipid goals,  dietary fat, sodium, fiber, plant stanol/sterol esters, sugar, and the components of a well-balanced, healthy diet.   Nutrition II class: Lifestyle Skills:  -Group instruction provided by PowerPoint slides, verbal discussion, and written materials to support subject matter. The instructor gives an explanation and review of label reading, grocery shopping for heart health, heart healthy recipe modifications, and ways to make healthier choices when eating out.   Diabetes Question & Answer:  -Group instruction provided by PowerPoint slides, verbal discussion, and written materials to support subject matter. The instructor gives an explanation and review of diabetes co-morbidities, pre- and post-prandial blood glucose goals, pre-exercise blood glucose goals, signs, symptoms, and treatment of hypoglycemia and hyperglycemia, and foot care basics.   Diabetes Blitz:  -Group instruction provided by PowerPoint slides, verbal discussion, and written materials to support subject matter. The instructor gives an explanation and review of the physiology behind type 1 and type 2 diabetes, diabetes medications and rational behind using different medications, pre- and post-prandial blood glucose recommendations and Hemoglobin A1c goals, diabetes diet, and exercise including blood glucose guidelines for exercising safely.    Portion Distortion:  -Group instruction provided by PowerPoint slides, verbal discussion, written materials, and food models to support subject matter. The  instructor gives an explanation of serving size versus portion size, changes in portions sizes over the last 20 years, and what consists of a serving from each food group.   Stress Management:  -Group instruction provided by verbal instruction, video, and written materials to support subject matter.  Instructors review role of stress in heart disease and how to cope with stress positively.     Exercising on Your Own:  -Group instruction provided by verbal instruction, power point, and written materials to support subject.  Instructors discuss benefits of exercise, components of exercise, frequency and intensity of exercise, and end points for exercise.  Also discuss use of nitroglycerin and activating EMS.  Review options of places to exercise outside of rehab.  Review guidelines for sex with heart disease.   Cardiac Drugs I:  -Group instruction provided by verbal instruction and written materials to support subject.  Instructor reviews cardiac drug classes: antiplatelets, anticoagulants, beta blockers, and statins.  Instructor discusses reasons, side effects, and lifestyle considerations for each drug class.   Cardiac Drugs II:  -Group instruction provided by verbal instruction and written materials to support subject.  Instructor reviews cardiac drug classes: angiotensin converting enzyme inhibitors (ACE-I), angiotensin II receptor blockers (ARBs), nitrates, and calcium channel blockers.  Instructor discusses reasons, side effects, and lifestyle considerations for each drug class.   Anatomy and Physiology of the Circulatory System:  Group verbal and written instruction and models provide basic cardiac anatomy and physiology, with the coronary electrical and arterial systems. Review of: AMI, Angina, Valve disease, Heart Failure, Peripheral Artery Disease, Cardiac Arrhythmia, Pacemakers, and the ICD.   Other Education:  -Group or individual verbal, written, or video instructions that support  the educational goals of the cardiac rehab program.   Holiday Eating Survival Tips:  -Group instruction provided by PowerPoint slides, verbal discussion, and written materials to support subject matter. The instructor gives patients tips, tricks, and techniques to help them not only survive but enjoy the holidays despite the onslaught of food that accompanies the holidays.   Knowledge Questionnaire Score: Knowledge Questionnaire Score - 11/06/17 1342      Knowledge Questionnaire Score   Pre Score  23/24       Core Components/Risk Factors/Patient Goals at Admission: Personal Goals and  Risk Factors at Admission - 11/06/17 1453      Core Components/Risk Factors/Patient Goals on Admission    Weight Management  Yes;Weight Maintenance;Weight Loss    Intervention  Weight Management: Develop a combined nutrition and exercise program designed to reach desired caloric intake, while maintaining appropriate intake of nutrient and fiber, sodium and fats, and appropriate energy expenditure required for the weight goal.;Weight Management: Provide education and appropriate resources to help participant work on and attain dietary goals.    Admit Weight  117 lb 8.1 oz (53.3 kg)    Goal Weight: Short Term  110 lb (49.9 kg)    Goal Weight: Long Term  110 lb (49.9 kg)    Expected Outcomes  Short Term: Continue to assess and modify interventions until short term weight is achieved;Long Term: Adherence to nutrition and physical activity/exercise program aimed toward attainment of established weight goal;Weight Maintenance: Understanding of the daily nutrition guidelines, which includes 25-35% calories from fat, 7% or less cal from saturated fats, less than 200mg  cholesterol, less than 1.5gm of sodium, & 5 or more servings of fruits and vegetables daily;Weight Loss: Understanding of general recommendations for a balanced deficit meal plan, which promotes 1-2 lb weight loss per week and includes a negative energy  balance of [phone] kcal/d;Understanding recommendations for meals to include 15-35% energy as protein, 25-35% energy from fat, 35-60% energy from carbohydrates, less than 200mg  of dietary cholesterol, 20-35 gm of total fiber daily;Understanding of distribution of calorie intake throughout the day with the consumption of 4-5 meals/snacks       Core Components/Risk Factors/Patient Goals Review:  Goals and Risk Factor Review    Row Name 11/06/17 1532             Core Components/Risk Factors/Patient Goals Review   Personal Goals Review  Weight Management/Obesity;Hypertension;Lipids          Core Components/Risk Factors/Patient Goals at Discharge (Final Review):  Goals and Risk Factor Review - 11/06/17 1532      Core Components/Risk Factors/Patient Goals Review   Personal Goals Review  Weight Management/Obesity;Hypertension;Lipids       ITP Comments: ITP Comments    Row Name 11/05/17 1226           ITP Comments  Dr. Fransico Him, Medical Director           Comments: Eleanor attended orientation from 1334 to 1456 to review rules and guidelines for program. Completed 6 minute walk test, Intitial ITP, and exercise prescription.  VSS. Telemetry-Sinus Rhythm with underlying V paced..  Asymptomatic.Patient is aware of her left arm restriction post RV lead change per Dr Curt Bears.Barnet Pall, RN,BSN 11/06/2017 3:47 PM

## 2017-11-07 ENCOUNTER — Telehealth (HOSPITAL_COMMUNITY): Payer: Self-pay | Admitting: Cardiac Rehabilitation

## 2017-11-07 NOTE — Telephone Encounter (Signed)
-----   Message from Will Meredith Leeds, MD sent at 11/04/2017  4:01 PM EDT ----- Regarding: RE: cardiac rehab  At this point, she can use her legs at rehab. She can use her right arm, but will have restrictions on her left arm for 6 weeks from vigorous work since lead revision. Thanks.  Will Camnitz  ----- Message ----- From: Lowell Guitar, RN Sent: 11/04/2017  10:13 AM To: [name], MD Subject: cardiac rehab                                  Dear Dr. Curt Bears,  Pt is s/p RV lead change on 10/13/17.  She is scheduled to begin cardiac rehab this week.   Is this appropriate for her to start cardiac rehab at this time?  Are there any activity restrictions?  Thank you, Andi Hence, RN, BSN Cardiac Pulmonary Rehab

## 2017-11-10 ENCOUNTER — Encounter (HOSPITAL_COMMUNITY): Payer: Medicare Other

## 2017-11-10 ENCOUNTER — Encounter: Payer: Medicare Other | Admitting: Cardiology

## 2017-11-10 ENCOUNTER — Encounter (HOSPITAL_COMMUNITY)
Admission: RE | Admit: 2017-11-10 | Discharge: 2017-11-10 | Disposition: A | Discharge status: Home / Self Care | Payer: Medicare Other | Source: Ambulatory Visit | Attending: Cardiology | Admitting: Cardiology

## 2017-11-10 DIAGNOSIS — Z955 Presence of coronary angioplasty implant and graft: Secondary | ICD-10-CM | POA: Diagnosis not present

## 2017-11-11 ENCOUNTER — Encounter (HOSPITAL_COMMUNITY): Payer: Self-pay

## 2017-11-11 NOTE — Progress Notes (Addendum)
Daily Session Note  Patient Details  Name: Elizabeth Estrada MRN: [id number] Date of Birth: 1941-01-30 Referring Provider:   Flowsheet Row CARDIAC REHAB PHASE II ORIENTATION from 11/06/2017 in [place]  Referring Provider  Martinique, Peter MD      Encounter Date: 11/10/2017  Check In: Session Check In - 11/10/17 1358    Check-In          Supervising physician immediately available to respond to emergencies  [facility] immediately available    Physician(s)  Dr Marthenia Rolling    Location  [facility]    Staff Present  Andi Hence, RN, BSN;Amber Fair, MS, ACSM RCEP, Exercise Physiologist;Maria Whitaker, RN, BSN    Medication changes reported      No    Fall or balance concerns reported     No    Tobacco Cessation  No Change    Warm-up and Cool-down  Performed as group-led instruction    Resistance Training Performed  Yes    VAD Patient?  No    PAD/SET Patient?  No        Pain Assessment          Currently in Pain?  No/denies           Capillary Blood Glucose: No results found for this or any previous visit (from the past 24 hour(s)).    Social History   Tobacco Use  Smoking Status Never Smoker  Smokeless Tobacco Never Used    Goals Met:  Exercise tolerated well  Goals Unmet:  Not Applicable  Comments: Pt started cardiac rehab today.  Pt tolerated light exercise without difficulty. VSS, telemetry-sinus rhythm, ventricular paced,   asymptomatic.  Medication list reconciled. Pt denies barriers to medicaiton compliance.  PSYCHOSOCIAL ASSESSMENT:  PHQ-8. Pt exhibits depression history with situational stress and anxiety.  No psychosocial needs identified at this time, no psychosocial interventions necessary.    Pt oriented to exercise equipment and routine.    Understanding verbalized.   Dr. Fransico Him is Medical Director for Cardiac Rehab at [facility].

## 2017-11-12 ENCOUNTER — Encounter (HOSPITAL_COMMUNITY): Payer: Medicare Other

## 2017-11-12 ENCOUNTER — Encounter (HOSPITAL_COMMUNITY)
Admission: RE | Admit: 2017-11-12 | Discharge: 2017-11-12 | Disposition: A | Discharge status: Home / Self Care | Payer: Medicare Other | Source: Ambulatory Visit | Attending: Cardiology | Admitting: Cardiology

## 2017-11-12 DIAGNOSIS — Z955 Presence of coronary angioplasty implant and graft: Secondary | ICD-10-CM | POA: Diagnosis not present

## 2017-11-12 NOTE — Progress Notes (Signed)
Elizabeth Estrada 77 y.o. female Nutrition Note Spoke with pt. Nutrition Plan and Nutrition Survey goals reviewed with pt. Pt is following a Heart Healthy diet.  Pt wants to maintain weight, is happy with her body weight currently. Per discussion, pt does not use canned/convenience foods often. Pt rarely adds salt to food. Pt eats out infrequently. Since blood lipids are elevated additionally discussed with patient a goal to identify and limit food sources of saturated fat, trans fat. Educated patient on label reading and distributed handout  Pt expressed understanding of the information reviewed. Pt aware of nutrition education classes offered and plans on attending nutrition classes.  No results found for: HGBA1C  Wt Readings from Last 3 Encounters:  11/06/17 117 lb 8.1 oz (53.3 kg)  10/13/17 118 lb (53.5 kg)  09/30/17 118 lb 12.8 oz (53.9 kg)    Nutrition Diagnosis ? Food-and nutrition-related knowledge deficit related to lack of exposure to information as related to diagnosis of: ? CVD   Nutrition Intervention ? Pt's individual nutrition plan reviewed with pt. ? Benefits of adopting Heart Healthy diet discussed when Medficts reviewed.    Goal(s)  ? Pt to identify and limit food sources of saturated fat, trans fat, and sodium   Plan:  Pt to attend nutrition classes ? Nutrition I ? Nutrition II ? Portion Distortion  Will provide client-centered nutrition education as part of interdisciplinary care.   Monitor and evaluate progress toward nutrition goal with team.   Laurina Bustle, MS, RD, LDN 11/12/2017 2:19 PM

## 2017-11-14 ENCOUNTER — Encounter (HOSPITAL_COMMUNITY): Payer: Medicare Other

## 2017-11-14 ENCOUNTER — Encounter (HOSPITAL_COMMUNITY)
Admission: RE | Admit: 2017-11-14 | Discharge: 2017-11-14 | Disposition: A | Discharge status: Home / Self Care | Payer: Medicare Other | Source: Ambulatory Visit | Attending: Cardiology | Admitting: Cardiology

## 2017-11-14 DIAGNOSIS — Z955 Presence of coronary angioplasty implant and graft: Secondary | ICD-10-CM | POA: Insufficient documentation

## 2017-11-17 ENCOUNTER — Encounter (HOSPITAL_COMMUNITY): Payer: Medicare Other

## 2017-11-17 ENCOUNTER — Encounter (HOSPITAL_COMMUNITY)
Admission: RE | Admit: 2017-11-17 | Discharge: 2017-11-17 | Disposition: A | Discharge status: Home / Self Care | Payer: Medicare Other | Source: Ambulatory Visit | Attending: Cardiology | Admitting: Cardiology

## 2017-11-17 DIAGNOSIS — Z955 Presence of coronary angioplasty implant and graft: Secondary | ICD-10-CM | POA: Diagnosis not present

## 2017-11-18 ENCOUNTER — Encounter (HOSPITAL_COMMUNITY): Payer: Self-pay

## 2017-11-18 ENCOUNTER — Encounter: Payer: Self-pay | Admitting: Cardiology

## 2017-11-18 ENCOUNTER — Ambulatory Visit (INDEPENDENT_AMBULATORY_CARE_PROVIDER_SITE_OTHER): Payer: Medicare Other | Admitting: Cardiology

## 2017-11-18 VITALS — BP 125/75 | HR 76 | Ht 62.5 in | Wt 117.4 lb

## 2017-11-18 DIAGNOSIS — I050 Rheumatic mitral stenosis: Secondary | ICD-10-CM | POA: Diagnosis not present

## 2017-11-18 DIAGNOSIS — I313 Pericardial effusion (noninflammatory): Secondary | ICD-10-CM | POA: Diagnosis not present

## 2017-11-18 DIAGNOSIS — I441 Atrioventricular block, second degree: Secondary | ICD-10-CM | POA: Diagnosis not present

## 2017-11-18 DIAGNOSIS — I3139 Other pericardial effusion (noninflammatory): Secondary | ICD-10-CM

## 2017-11-18 DIAGNOSIS — R0602 Shortness of breath: Secondary | ICD-10-CM

## 2017-11-18 DIAGNOSIS — I2510 Atherosclerotic heart disease of native coronary artery without angina pectoris: Secondary | ICD-10-CM | POA: Diagnosis not present

## 2017-11-18 DIAGNOSIS — Z950 Presence of cardiac pacemaker: Secondary | ICD-10-CM | POA: Diagnosis not present

## 2017-11-18 DIAGNOSIS — Z9861 Coronary angioplasty status: Secondary | ICD-10-CM | POA: Diagnosis not present

## 2017-11-18 NOTE — Assessment & Plan Note (Signed)
Unclear etiology. She did have a pericardial effusion in April, will re check her echo.

## 2017-11-18 NOTE — Patient Instructions (Signed)
Medication Instructions:  Your physician recommends that you continue on your current medications as directed. Please refer to the Current Medication list given to you today.  Labwork: None   Testing/Procedures: Your physician has requested that you have an echocardiogram. Echocardiography is a painless test that uses sound waves to create images of your heart. It provides your doctor with information about the size and shape of your heart and how well your heart's chambers and valves are working. This procedure takes approximately one hour. There are no restrictions for this procedure. [place]  Follow-Up: FOLLOW UP AS SCHEDULED  Any Other Special Instructions Will Be Listed Below (If Applicable). If you need a refill on your cardiac medications before your next appointment, please call your pharmacy.

## 2017-11-18 NOTE — Assessment & Plan Note (Signed)
MDT implanted 08/11/17- lead revision 10/13/17

## 2017-11-18 NOTE — Progress Notes (Signed)
11/18/2017 Elizabeth Estrada   03-30-1941  [id number]  Primary Physician [name], MD Primary Cardiologist: Dr Martinique - Dr Curt Bears EP  HPI:  Pleasant 77 y/o female with h/o MVP and PSVT, seen in the clinic in April 2019 with complaints of exertional dyspnea, palpitations, and vague chest tightness. While being evaluated in clinic on 07/14/17, she was noted to have a brief run of PSVT. She was set up for an echo which showed normal LVF and moderate MS. An exercise tolerance test and 2 week cardiac monitor were arranged. Cardiac monitor showed NSR with periods of intermittent complete heart block and type 2 second degree AVB w/ 2:1 conduction. ETT was limited due to poor exercise tolerance, pt only able to exercise for 1 min. She complained of chest discomfort during study and had hypertensive response to exercise. She developed 2:1 AV block w/ exertion that correlated with her chest pain and resolved during recovery. Based on these findings she was set up for definitive LHC. On 08/08/17 she had cath and RCA PCI with DES.  Post PCI her arrhythmia did not resolve and she had a MDT PTVD placed 08/11/17. She was just admitted 10/13/17 for lead revision and is in the office today for follow up. She notes intermittent SOB, not related to activity. She was concerned because she was told she had "fluid around her heart" at one point. I reviewed her echo from April and she had a small to moderate pericardial effusion then.      Current Outpatient Medications  Medication Sig Dispense Refill  . abatacept (ORENCIA) 250 MG injection Inject 250 mg into the vein every 30 (thirty) days.     Marland Kitchen aspirin 81 MG tablet Take 81 mg by mouth every other day.     Marland Kitchen atorvastatin (LIPITOR) 80 MG tablet Take 1 tablet (80 mg total) by mouth daily at 6 PM. 30 tablet 5  . cetirizine (ZYRTEC) 10 MG tablet Take 10 mg by mouth daily as needed for allergies.    . cholecalciferol (VITAMIN D) 1000 UNITS tablet Take 1,000 Units by  mouth daily.    . clopidogrel (PLAVIX) 75 MG tablet Take 1 tablet (75 mg total) by mouth daily with breakfast. 30 tablet 11  . escitalopram (LEXAPRO) 10 MG tablet Take 10 mg by mouth at bedtime.     . fluticasone (FLONASE) 50 MCG/ACT nasal spray Place 1-2 sprays into both nostrils daily as needed for allergies or rhinitis.    . Polyvinyl Alcohol-Povidone (REFRESH OP) Place 1-2 drops into both eyes daily as needed (for dry eyes).     No current facility-administered medications for this visit.     Allergies  Allergen Reactions  . Methotrexate Derivatives Itching and Swelling  . Plaquenil [Hydroxychloroquine Sulfate] Other (See Comments)    Unknown  . Remicade [Infliximab] Nausea And Vomiting and Swelling  . Ridaura [Auranofin] Other (See Comments)    unknown  . Sudafed [Pseudoephedrine] Other (See Comments)    nervous    Past Medical History:  Diagnosis Date  . 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 Chronic depression   . Coronary artery disease   . Family history of adverse reaction to anesthesia    "dad had PONV"  . Fibromyalgia   . GERD (gastroesophageal reflux disease)   . Heart murmur   . History of kidney stones   . Hypertension    "before stent and pacemaker" (10/13/2017)  . Melanoma (  [place])    removed from back  . MVP (mitral valve prolapse)   . Osteoarthritis   . Osteoporosis   . 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 RA (rheumatoid arthritis) ([place])    "all over; mainly hands and knees" (10/13/2017)  . Rib fracture     Social History   Socioeconomic History  . Marital status: Married    Spouse name: Not on file  . Number of children: 0  . Years of education: Not on file  . Highest education level: High school graduate  Occupational History  . Occupation: Retired  Scientific laboratory technician  . Financial resource strain: Not on file  . Food insecurity:    Worry: Never true    Inability: Never true  . Transportation needs:     Medical: No    Non-medical: No  Tobacco Use  . Smoking status: Never Smoker  . Smokeless tobacco: Never Used  Substance and Sexual Activity  . Alcohol use: Never    Frequency: Never  . Drug use: Never  . Sexual activity: Not on file  Lifestyle  . Physical activity:    Days per week: 5 days    Minutes per session: 30 min  . Stress: To some extent  Relationships  . Social connections:    Talks on phone: Not on file    Gets together: Not on file    Attends religious service: Not on file    Active member of club or organization: Not on file    Attends meetings of clubs or organizations: Not on file    Relationship status: Not on file  . Intimate partner violence:    Fear of current or ex partner: Not on file    Emotionally abused: Not on file    Physically abused: Not on file    Forced sexual activity: Not on file  Other Topics Concern  . Not on file  Social History Narrative  . Not on file     Family History  Problem Relation Age of Onset  . Coronary artery disease Mother   . Pancreatic cancer Father   . Heart disease Father        bypass  . Hypertension Father   . CAD Brother 71       CABG and AVR     Review of Systems: General: negative for chills, fever, night sweats or weight changes.  Cardiovascular: negative for chest pain, dyspnea on exertion, edema, orthopnea, palpitations, paroxysmal nocturnal dyspnea  Dermatological: negative for rash Respiratory: negative for cough or wheezing Urologic: negative for hematuria Abdominal: negative for nausea, vomiting, diarrhea, bright red blood per rectum, melena, or hematemesis Neurologic: negative for visual changes, syncope, or dizziness All other systems reviewed and are otherwise negative except as noted above.    Blood pressure 125/75, pulse 76, height 5' 2.5" (1.588 m), weight 117 lb 6.4 oz (53.3 kg), last menstrual period 04/15/1990.  General appearance: alert, cooperative, no distress and thin Neck: no  carotid bruit and no JVD Lungs: clear to auscultation bilaterally Heart: regular rate and rhythm Extremities: extremities normal, atraumatic, no cyanosis or edema Skin: Skin color, texture, turgor normal. No rashes or lesions Neurologic: Grossly normal   ASSESSMENT AND PLAN:   SOB (shortness of breath) Unclear etiology. She did have a pericardial effusion in April, will re check her echo.   Second degree AV block Symptomatic, did not resolve after her PCI- PTVDP 08/11/17  CAD S/P percutaneous coronary angioplasty RCA PCI DES-08/08/17 after an  abnormal treadmill  Mitral stenosis-mild Moderate by echo, mild at cath  Pacemaker MDT implanted 08/11/17- lead revision 10/13/17   PLAN  I'm not sure of the etiology of her SOB. It's not exertional. She did have a prior pericardial effusion and a recent lead revision. I'll check an echo. She has a f/u scheduled with Dr Martinique in Sept.   [name]-C 11/18/2017 2:08 PM

## 2017-11-18 NOTE — Assessment & Plan Note (Signed)
Symptomatic, did not resolve after her PCI- PTVDP 08/11/17

## 2017-11-18 NOTE — Assessment & Plan Note (Signed)
RCA PCI DES-08/08/17 after an abnormal treadmill

## 2017-11-18 NOTE — Assessment & Plan Note (Signed)
Moderate by echo, mild at cath

## 2017-11-19 ENCOUNTER — Encounter (HOSPITAL_COMMUNITY): Payer: Medicare Other

## 2017-11-19 ENCOUNTER — Encounter (HOSPITAL_COMMUNITY)
Admission: RE | Admit: 2017-11-19 | Discharge: 2017-11-19 | Disposition: A | Discharge status: Home / Self Care | Payer: Medicare Other | Source: Ambulatory Visit | Attending: Cardiology | Admitting: Cardiology

## 2017-11-19 DIAGNOSIS — Z955 Presence of coronary angioplasty implant and graft: Secondary | ICD-10-CM | POA: Diagnosis not present

## 2017-11-19 NOTE — Progress Notes (Signed)
Cardiac Individual Treatment Plan  Patient Details  Name: Elizabeth Estrada MRN: [id number] Date of Birth: 11-20-1940 Referring Provider:   Flowsheet Row CARDIAC REHAB PHASE II ORIENTATION from 11/06/2017 in [facility]  Referring Provider  [place], Peter MD      Initial Encounter Date:  [facility] PHASE II ORIENTATION from 11/06/2017 in [facility]  Date  11/06/17      Visit Diagnosis: 08/08/2017 Stented coronary artery, S/P DES RCA  Patient's Home Medications on Admission:  Current Outpatient Medications:  .  abatacept (ORENCIA) 250 MG injection, Inject 250 mg into the vein every 30 (thirty) days. , Disp: , Rfl:  .  aspirin 81 MG tablet, Take 81 mg by mouth every other day. , Disp: , Rfl:  .  atorvastatin (LIPITOR) 80 MG tablet, Take 1 tablet (80 mg total) by mouth daily at 6 PM., Disp: 30 tablet, Rfl: 5 .  cetirizine (ZYRTEC) 10 MG tablet, Take 10 mg by mouth daily as needed for allergies., Disp: , Rfl:  .  cholecalciferol (VITAMIN D) 1000 UNITS tablet, Take 1,000 Units by mouth daily., Disp: , Rfl:  .  clopidogrel (PLAVIX) 75 MG tablet, Take 1 tablet (75 mg total) by mouth daily with breakfast., Disp: 30 tablet, Rfl: 11 .  escitalopram (LEXAPRO) 10 MG tablet, Take 10 mg by mouth at bedtime. , Disp: , Rfl:  .  fluticasone (FLONASE) 50 MCG/ACT nasal spray, Place 1-2 sprays into both nostrils daily as needed for allergies or rhinitis., Disp: , Rfl:  .  Polyvinyl Alcohol-Povidone (REFRESH OP), Place 1-2 drops into both eyes daily as needed (for dry eyes)., Disp: , Rfl:   Past Medical History: Past Medical History:  Diagnosis Date  . 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 Chronic depression   . Coronary artery disease   . Family history of adverse reaction to anesthesia    "dad had PONV"  . Fibromyalgia   . GERD (gastroesophageal reflux disease)   . Heart murmur   . History of  kidney stones   . Hypertension    "before stent and pacemaker" (10/13/2017)  . Melanoma ([place])    removed from back  . MVP (mitral valve prolapse)   . Osteoarthritis   . Osteoporosis   . 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 RA (rheumatoid arthritis) ([facility])    "all over; mainly hands and knees" (10/13/2017)  . Rib fracture     Tobacco Use: Social History   Tobacco Use  Smoking Status Never Smoker  Smokeless Tobacco Never Used    Labs: Recent Review Flowsheet Data    Labs for ITP Cardiac and Pulmonary Rehab Latest Ref Rng & Units 07/14/2017 08/08/2017 08/08/2017   Cholestrol 100 - 199 mg/dL 227(H) - -   LDLCALC 0 - 99 mg/dL 127(H) - -   HDL >39 mg/dL 91 - -   Trlycerides 0 - 149 mg/dL 43 - -   PHART 7.350 - 7.450 - - 7.333(L)   PCO2ART 32.0 - 48.0 mmHg - - 42.1   HCO3 20.0 - 28.0 mmol/L - 21.9 22.3   TCO2 22 - 32 mmol/L - 23 24   ACIDBASEDEF 0.0 - 2.0 mmol/L - 5.0(H) 3.0(H)   O2SAT % - 64.0 93.0      Capillary Blood Glucose: No results found for: GLUCAP   Exercise Target Goals:    Exercise Program Goal: Individual exercise  prescription set using results from initial 6 min walk test and THRR while considering  patient's activity barriers and safety.   Exercise Prescription Goal: Initial exercise prescription builds to 30-45 minutes a day of aerobic activity, 2-3 days per week.  Home exercise guidelines will be given to patient during program as part of exercise prescription that the participant will acknowledge.  Activity Barriers & Risk Stratification: Activity Barriers & Cardiac Risk Stratification - 11/06/17 1436    Activity Barriers & Cardiac Risk Stratification          Activity Barriers  Joint Problems;Deconditioning;Muscular Weakness;Shortness of Breath;Balance Concerns;History of Falls;Other (comment);Arthritis    Comments  Rheumatoid arthritis    Cardiac Risk Stratification  High           6 Minute Walk: 6 Minute Walk    6 Minute Walk     Row Name 11/06/17 1411 11/06/17 1435 11/06/17 1452   Phase  no documentation  Initial  no documentation   Distance  no documentation  1278 feet  no documentation   Walk Time  no documentation  6 minutes  no documentation   # of Rest Breaks  no documentation  0  no documentation   MPH  no documentation  2.4  no documentation   METS  no documentation  2.6  no documentation   RPE  no documentation  11  no documentation   VO2 Peak  no documentation  9.1  no documentation   Symptoms  no documentation  No  no documentation   Resting HR  no documentation  78 bpm  no documentation   Resting BP  no documentation  118/72  no documentation   Resting Oxygen Saturation   no documentation  99 %  no documentation   Exercise Oxygen Saturation  during 6 min walk  no documentation  100 %  no documentation   Max Ex. HR  no documentation  86 bpm  no documentation   Max Ex. BP  no documentation  124/76  no documentation   2 Minute Post BP  no documentation  no documentation  102/60       Interval Oxygen    Row Name 11/06/17 1411 11/06/17 1435 11/06/17 1452   Interval Oxygen?  Yes  no documentation  no documentation          Oxygen Initial Assessment:   Oxygen Re-Evaluation:   Oxygen Discharge (Final Oxygen Re-Evaluation):   Initial Exercise Prescription: Initial Exercise Prescription - 11/06/17 1300    Date of Initial Exercise RX and Referring Provider          Date  11/06/17    Referring Provider  Martinique, Peter MD        Treadmill          MPH  1.8    Grade  0    Minutes  10    METs  2.38        Recumbant Bike          Level  1.5    Minutes  10    METs  1.8        NuStep          Level  2    SPM  80    Minutes  10    METs  1.8        Prescription Details          Frequency (times per week)  3    Duration  Progress to 30  minutes of continuous aerobic without signs/symptoms of physical distress        Intensity          THRR 40-80% of Max Heartrate  58-115     Ratings of Perceived Exertion  11-13    Perceived Dyspnea  0-4        Progression          Progression  Continue to progress workloads to maintain intensity without signs/symptoms of physical distress.        Resistance Training          Training Prescription  Yes    Weight  2lbs    Reps  10-15           Perform Capillary Blood Glucose checks as needed.  Exercise Prescription Changes: Exercise Prescription Changes    Response to Exercise    Row Name 11/10/17 1605   Blood Pressure (Admit)  106/78   Blood Pressure (Exercise)  142/80   Blood Pressure (Exit)  110/60   Heart Rate (Admit)  70 bpm   Heart Rate (Exercise)  104 bpm   Heart Rate (Exit)  70 bpm   Rating of Perceived Exertion (Exercise)  13   Symptoms  none   Comments  pt was oriented to exercise equipment today   Duration  Continue with 30 min of aerobic exercise without signs/symptoms of physical distress.   Intensity  THRR unchanged       Progression    Row Name 11/10/17 1605   Progression  Continue to progress workloads to maintain intensity without signs/symptoms of physical distress.   Average METs  2.1       Resistance Training    Row Name 11/10/17 1605   Training Prescription  Yes   Weight  1lbs   Reps  10-15   Time  10 Minutes       Treadmill    Row Name 11/10/17 1605   MPH  1.8   Grade  0   Minutes  10   METs  2.38       Recumbant Bike    Row Name 11/10/17 1605   Level  1.5   Minutes  10   METs  2       NuStep    Row Name 11/10/17 1605   Level  2   SPM  80   Minutes  10   METs  1.9          Exercise Comments: Exercise Comments    Row Name 11/14/17 1607   Exercise Comments  Pt completed first week of exercise at cardiac rehab. Pt was able to exercise for 30 minutes without signs/symptoms of physical distress.       Exercise Goals and Review: Exercise Goals    Exercise Goals    Row Name 11/06/17 1412   Increase Physical Activity  Yes   Intervention  Provide advice,  education, support and counseling about physical activity/exercise needs.;Develop an individualized exercise prescription for aerobic and resistive training based on initial evaluation findings, risk stratification, comorbidities and participant's personal goals.   Expected Outcomes  Short Term: Attend rehab on a regular basis to increase amount of physical activity.;Long Term: Exercising regularly at least 3-5 days a week.;Long Term: Add in home exercise to make exercise part of routine and to increase amount of physical activity.   Increase Strength and Stamina  Yes improve functional fitness   Intervention  Provide advice, education, support and counseling about physical activity/exercise needs.;Develop  an individualized exercise prescription for aerobic and resistive training based on initial evaluation findings, risk stratification, comorbidities and participant's personal goals.   Expected Outcomes  Short Term: Increase workloads from initial exercise prescription for resistance, speed, and METs.;Short Term: Perform resistance training exercises routinely during rehab and add in resistance training at home;Long Term: Improve cardiorespiratory fitness, muscular endurance and strength as measured by increased METs and functional capacity (6MWT)   Able to understand and use rate of perceived exertion (RPE) scale  Yes   Intervention  Provide education and explanation on how to use RPE scale   Expected Outcomes  Short Term: Able to use RPE daily in rehab to express subjective intensity level;Long Term:  Able to use RPE to guide intensity level when exercising independently   Knowledge and understanding of Target Heart Rate Range (THRR)  Yes   Intervention  Provide education and explanation of THRR including how the numbers were predicted and where they are located for reference   Expected Outcomes  Short Term: Able to state/look up THRR;Short Term: Able to use daily as guideline for intensity in  rehab;Long Term: Able to use THRR to govern intensity when exercising independently   Able to check pulse independently  Yes   Intervention  Provide education and demonstration on how to check pulse in carotid and radial arteries.;Review the importance of being able to check your own pulse for safety during independent exercise   Expected Outcomes  Short Term: Able to explain why pulse checking is important during independent exercise;Long Term: Able to check pulse independently and accurately   Understanding of Exercise Prescription  Yes   Intervention  Provide education, explanation, and written materials on patient's individual exercise prescription   Expected Outcomes  Short Term: Able to explain program exercise prescription;Long Term: Able to explain home exercise prescription to exercise independently          Exercise Goals Re-Evaluation : Exercise Goals Re-Evaluation    Exercise Goal Re-Evaluation    Row Name 11/14/17 1608   Exercise Goals Review  Increase Physical Activity;Able to understand and use rate of perceived exertion (RPE) scale;Increase Strength and Stamina   Comments  Pt able to exercise for 30 minutes without difficulty. Pt also has a good understanding of RPE scale and exercise routine in cardiac rehab.   Expected Outcomes  Pt will continue to improve in cardiorespiratory fitness and understand exercise flow/routine in cardiac rehab.           Discharge Exercise Prescription (Final Exercise Prescription Changes): Exercise Prescription Changes - 11/10/17 1605    Response to Exercise          Blood Pressure (Admit)  106/78    Blood Pressure (Exercise)  142/80    Blood Pressure (Exit)  110/60    Heart Rate (Admit)  70 bpm    Heart Rate (Exercise)  104 bpm    Heart Rate (Exit)  70 bpm    Rating of Perceived Exertion (Exercise)  13    Symptoms  none    Comments  pt was oriented to exercise equipment today    Duration  Continue with 30 min of aerobic exercise  without signs/symptoms of physical distress.    Intensity  THRR unchanged        Progression          Progression  Continue to progress workloads to maintain intensity without signs/symptoms of physical distress.    Average METs  2.1  Resistance Training          Training Prescription  Yes    Weight  1lbs    Reps  10-15    Time  10 Minutes        Treadmill          MPH  1.8    Grade  0    Minutes  10    METs  2.38        Recumbant Bike          Level  1.5    Minutes  10    METs  2        NuStep          Level  2    SPM  80    Minutes  10    METs  1.9           Nutrition:  Target Goals: Understanding of nutrition guidelines, daily intake of sodium 1500mg , cholesterol 200mg , calories 30% from fat and 7% or less from saturated fats, daily to have 5 or more servings of fruits and vegetables.  Biometrics: Pre Biometrics - 11/06/17 1452    Pre Biometrics          Height  5' 2.75" (1.594 m)    Weight  117 lb 8.1 oz (53.3 kg)    Waist Circumference  31 inches    Hip Circumference  40.5 inches    Waist to Hip Ratio  0.77 %    BMI (Calculated)  20.98    Triceps Skinfold  23 mm    % Body Fat  33.9 %    Grip Strength  18 kg    Flexibility  13.5 in    Single Leg Stand  8.34 seconds            Nutrition Therapy Plan and Nutrition Goals: Nutrition Therapy & Goals - 11/06/17 0743    Nutrition Therapy          Diet  heart healthy        Personal Nutrition Goals          Nutrition Goal  pt to identify and limit food sources of saturated fat, trans fat, sodium    Personal Goal #2  --    Personal Goal #3  --        Intervention Plan          Intervention  Prescribe, educate and counsel regarding individualized specific dietary modifications aiming towards targeted core components such as weight, hypertension, lipid management, diabetes, heart failure and other comorbidities.    Expected Outcomes  Short Term Goal: Understand basic principles of  dietary content, such as calories, fat, sodium, cholesterol and nutrients.           Nutrition Assessments: Nutrition Assessments - 11/06/17 0756    MEDFICTS Scores          Pre Score  --           Nutrition Goals Re-Evaluation:   Nutrition Goals Re-Evaluation:   Nutrition Goals Discharge (Final Nutrition Goals Re-Evaluation):   Psychosocial: Target Goals: Acknowledge presence or absence of significant depression and/or stress, maximize coping skills, provide positive support system. Participant is able to verbalize types and ability to use techniques and skills needed for reducing stress and depression.  Initial Review & Psychosocial Screening: Initial Psych Review & Screening - 11/06/17 1534    Initial Review          Current issues with  None  Identified;History of Depression        Family Dynamics          Good Support System?  Yes Shateka has her husband, daughter and sisters for support        Barriers          Psychosocial barriers to participate in program  There are no identifiable barriers or psychosocial needs.        Screening Interventions          Interventions  Encouraged to exercise    Expected Outcomes  Long Term Goal: Stressors or current issues are controlled or eliminated.;Short Term goal: Identification and review with participant of any Quality of Life or Depression concerns found by scoring the questionnaire.           Quality of Life Scores: Quality of Life - 11/06/17 1342    Quality of Life          Select  Quality of Life        Quality of Life Scores          Health/Function Pre  19.25 %    Socioeconomic Pre  22 %    Psych/Spiritual Pre  22.93 %    Family Pre  25.2 %    GLOBAL Pre  21.53 %          Scores of 19 and below usually indicate a poorer quality of life in these areas.  A difference of  2-3 points is a clinically meaningful difference.  A difference of 2-3 points in the total score of the Quality of Life Index  has been associated with significant improvement in overall quality of life, self-image, physical symptoms, and general health in studies assessing change in quality of life.  PHQ-9: Recent Review Flowsheet Data    Depression screen [facility] 2/9 11/11/2017   Decreased Interest 1   Down, Depressed, Hopeless 3   PHQ - 2 Score 4   Altered sleeping 2   Tired, decreased energy 2   PHQ-9 Score 8   Difficult doing work/chores Somewhat difficult     Interpretation of Total Score  Total Score Depression Severity:  1-4 = Minimal depression, 5-9 = Mild depression, 10-14 = Moderate depression, 15-19 = Moderately severe depression, 20-27 = Severe depression   Psychosocial Evaluation and Intervention: Psychosocial Evaluation - 11/11/17 0710    Psychosocial Evaluation & Interventions          Interventions  Therapist referral;Stress management education;Relaxation education;Encouraged to exercise with the program and follow exercise prescription    Comments  pt with known depression/anxiety treated with lexapro with multiple stressors. 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 In addition, [place] neighborhood, which has isolated her from her friends and love of gardening. Her recent cardiac event has increased her anxiety. pt is accepting to schedule [facility] appt.      Expected Outcomes  pt will exhibit improved coping skills and outlook.    Continue Psychosocial Services   Follow up required by staff           Psychosocial Re-Evaluation: Psychosocial Re-Evaluation    Psychosocial Re-Evaluation    [place] Name 11/18/17 1224   Current issues with  None Identified   Comments  pt with known depression history accepting of offer to seek counseling.  Scheduled appt with Jeanella Craze 11/24/2017.     Expected Outcomes   pt will exhibit improved outlook with good coping skills.  Interventions  Stress management education;Encouraged to attend Cardiac  Rehabilitation for the exercise;Relaxation education          Psychosocial Discharge (Final Psychosocial Re-Evaluation): Psychosocial Re-Evaluation - 11/18/17 1224    Psychosocial Re-Evaluation          Current issues with  None Identified    Comments  pt with known depression history accepting of offer to seek counseling.  Scheduled appt with Jeanella Craze 11/24/2017.      Expected Outcomes   pt will exhibit improved outlook with good coping skills.     Interventions  Stress management education;Encouraged to attend Cardiac Rehabilitation for the exercise;Relaxation education           Vocational Rehabilitation: Provide vocational rehab assistance to qualifying candidates.   Vocational Rehab Evaluation & Intervention: Vocational Rehab - 11/06/17 1532    Initial Vocational Rehab Evaluation & Intervention          Assessment shows need for Vocational Rehabilitation  No Azaliah is retired and does not need vocational rehab at this time           Education: Education Goals: Education classes will be provided on a weekly basis, covering required topics. Participant will state understanding/return demonstration of topics presented.  Learning Barriers/Preferences: Learning Barriers/Preferences - 11/06/17 1406    Learning Barriers/Preferences          Learning Barriers  Hearing    Learning Preferences  Skilled Demonstration;Video;Pictoral;Written Material           Education Topics: Count Your Pulse:  -Group instruction provided by verbal instruction, demonstration, patient participation and written materials to support subject.  Instructors address importance of being able to find your pulse and how to count your pulse when at home without a heart monitor.  Patients get hands on experience counting their pulse with staff help and individually.   Heart Attack, Angina, and Risk Factor Modification:  -Group instruction provided by verbal instruction, video, and written  materials to support subject.  Instructors address signs and symptoms of angina and heart attacks.    Also discuss risk factors for heart disease and how to make changes to improve heart health risk factors.   Functional Fitness:  -Group instruction provided by verbal instruction, demonstration, patient participation, and written materials to support subject.  Instructors address safety measures for doing things around the house.  Discuss how to get up and down off the floor, how to pick things up properly, how to safely get out of a chair without assistance, and balance training.   Meditation and Mindfulness:  -Group instruction provided by verbal instruction, patient participation, and written materials to support subject.  Instructor addresses importance of mindfulness and meditation practice to help reduce stress and improve awareness.  Instructor also leads participants through a meditation exercise.    Stretching for Flexibility and Mobility:  -Group instruction provided by verbal instruction, patient participation, and written materials to support subject.  Instructors lead participants through series of stretches that are designed to increase flexibility thus improving mobility.  These stretches are additional exercise for major muscle groups that are typically performed during regular warm up and cool down.   Hands Only CPR:  -Group verbal, video, and participation provides a basic overview of AHA guidelines for community CPR. Role-play of emergencies allow participants the opportunity to practice calling for help and chest compression technique with discussion of AED use.   Hypertension: -Group verbal and written instruction that provides a basic overview of hypertension including the most  recent diagnostic guidelines, risk factor reduction with self-care instructions and medication management.    Nutrition I class: Heart Healthy Eating:  -Group instruction provided by PowerPoint  slides, verbal discussion, and written materials to support subject matter. The instructor gives an explanation and review of the Therapeutic Lifestyle Changes diet recommendations, which includes a discussion on lipid goals, dietary fat, sodium, fiber, plant stanol/sterol esters, sugar, and the components of a well-balanced, healthy diet.   Nutrition II class: Lifestyle Skills:  -Group instruction provided by PowerPoint slides, verbal discussion, and written materials to support subject matter. The instructor gives an explanation and review of label reading, grocery shopping for heart health, heart healthy recipe modifications, and ways to make healthier choices when eating out.   Diabetes Question & Answer:  -Group instruction provided by PowerPoint slides, verbal discussion, and written materials to support subject matter. The instructor gives an explanation and review of diabetes co-morbidities, pre- and post-prandial blood glucose goals, pre-exercise blood glucose goals, signs, symptoms, and treatment of hypoglycemia and hyperglycemia, and foot care basics.   Diabetes Blitz:  -Group instruction provided by PowerPoint slides, verbal discussion, and written materials to support subject matter. The instructor gives an explanation and review of the physiology behind type 1 and type 2 diabetes, diabetes medications and rational behind using different medications, pre- and post-prandial blood glucose recommendations and Hemoglobin A1c goals, diabetes diet, and exercise including blood glucose guidelines for exercising safely.    Portion Distortion:  -Group instruction provided by PowerPoint slides, verbal discussion, written materials, and food models to support subject matter. The instructor gives an explanation of serving size versus portion size, changes in portions sizes over the last 20 years, and what consists of a serving from each food group.   Stress Management:  -Group instruction  provided by verbal instruction, video, and written materials to support subject matter.  Instructors review role of stress in heart disease and how to cope with stress positively.     Exercising on Your Own:  -Group instruction provided by verbal instruction, power point, and written materials to support subject.  Instructors discuss benefits of exercise, components of exercise, frequency and intensity of exercise, and end points for exercise.  Also discuss use of nitroglycerin and activating EMS.  Review options of places to exercise outside of rehab.  Review guidelines for sex with heart disease.   Cardiac Drugs I:  -Group instruction provided by verbal instruction and written materials to support subject.  Instructor reviews cardiac drug classes: antiplatelets, anticoagulants, beta blockers, and statins.  Instructor discusses reasons, side effects, and lifestyle considerations for each drug class.   Cardiac Drugs II:  -Group instruction provided by verbal instruction and written materials to support subject.  Instructor reviews cardiac drug classes: angiotensin converting enzyme inhibitors (ACE-I), angiotensin II receptor blockers (ARBs), nitrates, and calcium channel blockers.  Instructor discusses reasons, side effects, and lifestyle considerations for each drug class.   Anatomy and Physiology of the Circulatory System:  Group verbal and written instruction and models provide basic cardiac anatomy and physiology, with the coronary electrical and arterial systems. Review of: AMI, Angina, Valve disease, Heart Failure, Peripheral Artery Disease, Cardiac Arrhythmia, Pacemakers, and the ICD.   Other Education:  -Group or individual verbal, written, or video instructions that support the educational goals of the cardiac rehab program.   Holiday Eating Survival Tips:  -Group instruction provided by PowerPoint slides, verbal discussion, and written materials to support subject matter. The  instructor gives patients tips, tricks, and techniques  to help them not only survive but enjoy the holidays despite the onslaught of food that accompanies the holidays.   Knowledge Questionnaire Score: Knowledge Questionnaire Score - 11/06/17 1342    Knowledge Questionnaire Score          Pre Score  23/24           Core Components/Risk Factors/Patient Goals at Admission: Personal Goals and Risk Factors at Admission - 11/06/17 1453    Core Components/Risk Factors/Patient Goals on Admission           Weight Management  Yes;Weight Maintenance;Weight Loss    Intervention  Weight Management: Develop a combined nutrition and exercise program designed to reach desired caloric intake, while maintaining appropriate intake of nutrient and fiber, sodium and fats, and appropriate energy expenditure required for the weight goal.;Weight Management: Provide education and appropriate resources to help participant work on and attain dietary goals.    Admit Weight  117 lb 8.1 oz (53.3 kg)    Goal Weight: Short Term  110 lb (49.9 kg)    Goal Weight: Long Term  110 lb (49.9 kg)    Expected Outcomes  Short Term: Continue to assess and modify interventions until short term weight is achieved;Long Term: Adherence to nutrition and physical activity/exercise program aimed toward attainment of established weight goal;Weight Maintenance: Understanding of the daily nutrition guidelines, which includes 25-35% calories from fat, 7% or less cal from saturated fats, less than 200mg  cholesterol, less than 1.5gm of sodium, & 5 or more servings of fruits and vegetables daily;Weight Loss: Understanding of general recommendations for a balanced deficit meal plan, which promotes 1-2 lb weight loss per week and includes a negative energy balance of [id number] kcal/d;Understanding recommendations for meals to include 15-35% energy as protein, 25-35% energy from fat, 35-60% energy from carbohydrates, less than 200mg  of dietary  cholesterol, 20-35 gm of total fiber daily;Understanding of distribution of calorie intake throughout the day with the consumption of 4-5 meals/snacks           Core Components/Risk Factors/Patient Goals Review:  Goals and Risk Factor Review    Core Components/Risk Factors/Patient Goals Review    Row Name 11/06/17 1532 11/11/17 0705 11/18/17 1224   Personal Goals Review  Weight Management/Obesity;Hypertension;Lipids  Weight Management/Obesity;Hypertension;Lipids;Stress  Weight Management/Obesity;Hypertension;Lipids;Stress   Review  no documentation  pt with multiple CAD RF demonstrates willingness to participate in CR program.pt personal goals are to increase socialization in addition to build strength/stamina.  pt encouraged to engage in all aspects of CR program including peer interaction, education, exercise and nutrition offerings.   pt with multiple CAD RF demonstrates willingness to participate in CR program.pt personal goals are to increase socialization in addition to build strength/stamina.  pt encouraged to engage in all aspects of CR program including peer interaction, education, exercise and nutrition offerings.    Expected Outcomes  no documentation  pt will participate in CR exercise, nutrition, and lifestyle modification education opportunites while building community with peers.    pt will participate in CR exercise, nutrition, and lifestyle modification education opportunites while building community with peers.            Core Components/Risk Factors/Patient Goals at Discharge (Final Review):  Goals and Risk Factor Review - 11/18/17 1224    Core Components/Risk Factors/Patient Goals Review          Personal Goals Review  Weight Management/Obesity;Hypertension;Lipids;Stress    Review  pt with multiple CAD RF demonstrates willingness to participate in CR program.pt personal goals  are to increase socialization in addition to build strength/stamina.  pt encouraged to engage in  all aspects of CR program including peer interaction, education, exercise and nutrition offerings.     Expected Outcomes  pt will participate in CR exercise, nutrition, and lifestyle modification education opportunites while building community with peers.             ITP Comments: ITP Comments    Row Name 11/05/17 1226 11/10/17 0703 11/18/17 1213   ITP Comments  Dr. Fransico Him, Medical Director   pt started group exercise program.  pt tolerated light activity without difficulty. pt oriented to exercise equipment and safety routine.   30 day ITP review. pt demonstrates willingness to participate in CR activities.       Comments:

## 2017-11-21 ENCOUNTER — Encounter (HOSPITAL_COMMUNITY): Payer: Medicare Other

## 2017-11-21 ENCOUNTER — Encounter (HOSPITAL_COMMUNITY)
Admission: RE | Admit: 2017-11-21 | Discharge: 2017-11-21 | Disposition: A | Discharge status: Home / Self Care | Payer: Medicare Other | Source: Ambulatory Visit | Attending: Cardiology | Admitting: Cardiology

## 2017-11-21 DIAGNOSIS — Z955 Presence of coronary angioplasty implant and graft: Secondary | ICD-10-CM

## 2017-11-24 ENCOUNTER — Encounter (HOSPITAL_COMMUNITY): Payer: Medicare Other

## 2017-11-24 ENCOUNTER — Encounter (HOSPITAL_COMMUNITY)
Admission: RE | Admit: 2017-11-24 | Discharge: 2017-11-24 | Disposition: A | Discharge status: Home / Self Care | Payer: Medicare Other | Source: Ambulatory Visit | Attending: Cardiology | Admitting: Cardiology

## 2017-11-24 DIAGNOSIS — Z955 Presence of coronary angioplasty implant and graft: Secondary | ICD-10-CM

## 2017-11-25 NOTE — Progress Notes (Signed)
Reviewed home exercise with pt today.  Pt plans to walk for exercise.  Reviewed THR, pulse, RPE, sign and symptoms, NTG use, and when to call 911 or MD.  Also discussed weather considerations and indoor options.  Pt voiced understanding.

## 2017-11-26 ENCOUNTER — Encounter (HOSPITAL_COMMUNITY): Payer: Medicare Other

## 2017-11-26 ENCOUNTER — Encounter (HOSPITAL_COMMUNITY)
Admission: RE | Admit: 2017-11-26 | Discharge: 2017-11-26 | Disposition: A | Discharge status: Home / Self Care | Payer: Medicare Other | Source: Ambulatory Visit | Attending: Cardiology | Admitting: Cardiology

## 2017-11-26 DIAGNOSIS — Z955 Presence of coronary angioplasty implant and graft: Secondary | ICD-10-CM

## 2017-11-28 ENCOUNTER — Encounter (HOSPITAL_COMMUNITY)
Admission: RE | Admit: 2017-11-28 | Discharge: 2017-11-28 | Disposition: A | Discharge status: Home / Self Care | Payer: Medicare Other | Source: Ambulatory Visit | Attending: Cardiology | Admitting: Cardiology

## 2017-11-28 ENCOUNTER — Other Ambulatory Visit (HOSPITAL_COMMUNITY): Payer: Medicare Other

## 2017-11-28 ENCOUNTER — Encounter (HOSPITAL_COMMUNITY): Payer: Medicare Other

## 2017-11-28 DIAGNOSIS — Z955 Presence of coronary angioplasty implant and graft: Secondary | ICD-10-CM

## 2017-12-01 ENCOUNTER — Encounter (HOSPITAL_COMMUNITY)
Admission: RE | Admit: 2017-12-01 | Discharge: 2017-12-01 | Disposition: A | Discharge status: Home / Self Care | Payer: Medicare Other | Source: Ambulatory Visit | Attending: Cardiology | Admitting: Cardiology

## 2017-12-01 ENCOUNTER — Encounter (HOSPITAL_COMMUNITY): Payer: Medicare Other

## 2017-12-01 DIAGNOSIS — Z955 Presence of coronary angioplasty implant and graft: Secondary | ICD-10-CM | POA: Diagnosis not present

## 2017-12-02 DIAGNOSIS — Z6821 Body mass index (BMI) 21.0-21.9, adult: Secondary | ICD-10-CM | POA: Diagnosis not present

## 2017-12-02 DIAGNOSIS — M0579 Rheumatoid arthritis with rheumatoid factor of multiple sites without organ or systems involvement: Secondary | ICD-10-CM | POA: Diagnosis not present

## 2017-12-02 DIAGNOSIS — M150 Primary generalized (osteo)arthritis: Secondary | ICD-10-CM | POA: Diagnosis not present

## 2017-12-02 DIAGNOSIS — M810 Age-related osteoporosis without current pathological fracture: Secondary | ICD-10-CM | POA: Diagnosis not present

## 2017-12-02 DIAGNOSIS — M25461 Effusion, right knee: Secondary | ICD-10-CM | POA: Diagnosis not present

## 2017-12-02 DIAGNOSIS — M5136 Other intervertebral disc degeneration, lumbar region: Secondary | ICD-10-CM | POA: Diagnosis not present

## 2017-12-03 ENCOUNTER — Encounter (HOSPITAL_COMMUNITY): Payer: Medicare Other

## 2017-12-03 ENCOUNTER — Encounter (HOSPITAL_COMMUNITY)
Admission: RE | Admit: 2017-12-03 | Discharge: 2017-12-03 | Disposition: A | Discharge status: Home / Self Care | Payer: Medicare Other | Source: Ambulatory Visit | Attending: Cardiology | Admitting: Cardiology

## 2017-12-03 DIAGNOSIS — Z955 Presence of coronary angioplasty implant and graft: Secondary | ICD-10-CM | POA: Diagnosis not present

## 2017-12-04 DIAGNOSIS — M0579 Rheumatoid arthritis with rheumatoid factor of multiple sites without organ or systems involvement: Secondary | ICD-10-CM | POA: Diagnosis not present

## 2017-12-04 DIAGNOSIS — Z79899 Other long term (current) drug therapy: Secondary | ICD-10-CM | POA: Diagnosis not present

## 2017-12-05 ENCOUNTER — Encounter (HOSPITAL_COMMUNITY)
Admission: RE | Admit: 2017-12-05 | Discharge: 2017-12-05 | Disposition: A | Discharge status: Home / Self Care | Payer: Medicare Other | Source: Ambulatory Visit | Attending: Cardiology | Admitting: Cardiology

## 2017-12-05 ENCOUNTER — Ambulatory Visit (HOSPITAL_COMMUNITY): Payer: Medicare Other | Attending: Cardiology

## 2017-12-05 ENCOUNTER — Encounter (HOSPITAL_COMMUNITY): Payer: Medicare Other

## 2017-12-05 ENCOUNTER — Other Ambulatory Visit: Payer: Self-pay

## 2017-12-05 DIAGNOSIS — I081 Rheumatic disorders of both mitral and tricuspid valves: Secondary | ICD-10-CM | POA: Diagnosis not present

## 2017-12-05 DIAGNOSIS — Z950 Presence of cardiac pacemaker: Secondary | ICD-10-CM | POA: Insufficient documentation

## 2017-12-05 DIAGNOSIS — Z955 Presence of coronary angioplasty implant and graft: Secondary | ICD-10-CM

## 2017-12-05 DIAGNOSIS — I313 Pericardial effusion (noninflammatory): Secondary | ICD-10-CM

## 2017-12-05 DIAGNOSIS — R0602 Shortness of breath: Secondary | ICD-10-CM | POA: Insufficient documentation

## 2017-12-05 DIAGNOSIS — I341 Nonrheumatic mitral (valve) prolapse: Secondary | ICD-10-CM | POA: Diagnosis not present

## 2017-12-05 DIAGNOSIS — I3139 Other pericardial effusion (noninflammatory): Secondary | ICD-10-CM

## 2017-12-08 ENCOUNTER — Encounter (HOSPITAL_COMMUNITY): Payer: Medicare Other

## 2017-12-08 ENCOUNTER — Encounter (HOSPITAL_COMMUNITY)
Admission: RE | Admit: 2017-12-08 | Discharge: 2017-12-08 | Disposition: A | Discharge status: Home / Self Care | Payer: Medicare Other | Source: Ambulatory Visit | Attending: Cardiology | Admitting: Cardiology

## 2017-12-08 DIAGNOSIS — Z955 Presence of coronary angioplasty implant and graft: Secondary | ICD-10-CM

## 2017-12-09 ENCOUNTER — Telehealth: Payer: Self-pay | Admitting: Cardiology

## 2017-12-09 NOTE — Telephone Encounter (Signed)
Called patient to discuss her test results and let her know I asked Dr Martinique to review as well. She is doing well- only has SOB "occasionally". I told her to continue to monitor her symptoms and keep her f/u with Dr Martinique in September. If she has increasing symptoms she'll let us know. At that point I would refer her to pulmonary for evaluation.   [name]-C 12/09/2017 4:07 PM

## 2017-12-10 ENCOUNTER — Encounter (HOSPITAL_COMMUNITY)
Admission: RE | Admit: 2017-12-10 | Discharge: 2017-12-10 | Disposition: A | Discharge status: Home / Self Care | Payer: Medicare Other | Source: Ambulatory Visit | Attending: Cardiology | Admitting: Cardiology

## 2017-12-10 ENCOUNTER — Encounter (HOSPITAL_COMMUNITY): Payer: Medicare Other

## 2017-12-10 DIAGNOSIS — Z955 Presence of coronary angioplasty implant and graft: Secondary | ICD-10-CM | POA: Diagnosis not present

## 2017-12-12 ENCOUNTER — Encounter (HOSPITAL_COMMUNITY)
Admission: RE | Admit: 2017-12-12 | Discharge: 2017-12-12 | Disposition: A | Discharge status: Home / Self Care | Payer: Medicare Other | Source: Ambulatory Visit | Attending: Cardiology | Admitting: Cardiology

## 2017-12-12 ENCOUNTER — Encounter (HOSPITAL_COMMUNITY): Payer: Medicare Other

## 2017-12-12 DIAGNOSIS — Z955 Presence of coronary angioplasty implant and graft: Secondary | ICD-10-CM

## 2017-12-17 ENCOUNTER — Encounter (HOSPITAL_COMMUNITY): Payer: Medicare Other

## 2017-12-17 ENCOUNTER — Encounter (HOSPITAL_COMMUNITY)
Admission: RE | Admit: 2017-12-17 | Discharge: 2017-12-17 | Disposition: A | Discharge status: Home / Self Care | Payer: Medicare Other | Source: Ambulatory Visit | Attending: Cardiology | Admitting: Cardiology

## 2017-12-17 DIAGNOSIS — Z955 Presence of coronary angioplasty implant and graft: Secondary | ICD-10-CM | POA: Insufficient documentation

## 2017-12-18 NOTE — Progress Notes (Signed)
Cardiac Individual Treatment Plan  Patient Details  Name: Elizabeth Estrada MRN: [id number] Date of Birth: Feb 23, 1941 Referring Provider:   Flowsheet Row CARDIAC REHAB PHASE II ORIENTATION from 11/06/2017 in [place]  Referring Provider  Martinique, Peter MD      Initial Encounter Date:  [place] PHASE II ORIENTATION from 11/06/2017 in [place]  Date  11/06/17      Visit Diagnosis: 08/08/2017 Stented coronary artery, S/P DES RCA  Patient's Home Medications on Admission:  Current Outpatient Medications:  .  abatacept (ORENCIA) 250 MG injection, Inject 250 mg into the vein every 30 (thirty) days. , Disp: , Rfl:  .  aspirin 81 MG tablet, Take 81 mg by mouth every other day. , Disp: , Rfl:  .  atorvastatin (LIPITOR) 80 MG tablet, Take 1 tablet (80 mg total) by mouth daily at 6 PM., Disp: 30 tablet, Rfl: 5 .  cetirizine (ZYRTEC) 10 MG tablet, Take 10 mg by mouth daily as needed for allergies., Disp: , Rfl:  .  cholecalciferol (VITAMIN D) 1000 UNITS tablet, Take 1,000 Units by mouth daily., Disp: , Rfl:  .  clopidogrel (PLAVIX) 75 MG tablet, Take 1 tablet (75 mg total) by mouth daily with breakfast., Disp: 30 tablet, Rfl: 11 .  escitalopram (LEXAPRO) 10 MG tablet, Take 10 mg by mouth at bedtime. , Disp: , Rfl:  .  fluticasone (FLONASE) 50 MCG/ACT nasal spray, Place 1-2 sprays into both nostrils daily as needed for allergies or rhinitis., Disp: , Rfl:  .  Polyvinyl Alcohol-Povidone (REFRESH OP), Place 1-2 drops into both eyes daily as needed (for dry eyes)., Disp: , Rfl:   Past Medical History: Past Medical History:  Diagnosis Date  . 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 Chronic depression   . Coronary artery disease   . Family history of adverse reaction to anesthesia    "dad had PONV"  . Fibromyalgia   . GERD (gastroesophageal reflux disease)   . Heart murmur   . History of  kidney stones   . Hypertension    "before stent and pacemaker" (10/13/2017)  . Melanoma ([place])    removed from back  . MVP (mitral valve prolapse)   . Osteoarthritis   . Osteoporosis   . 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 RA (rheumatoid arthritis) ([place])    "all over; mainly hands and knees" (10/13/2017)  . Rib fracture     Tobacco Use: Social History   Tobacco Use  Smoking Status Never Smoker  Smokeless Tobacco Never Used    Labs: Recent Review Flowsheet Data    Labs for ITP Cardiac and Pulmonary Rehab Latest Ref Rng & Units 07/14/2017 08/08/2017 08/08/2017   Cholestrol 100 - 199 mg/dL 227(H) - -   LDLCALC 0 - 99 mg/dL 127(H) - -   HDL >39 mg/dL 91 - -   Trlycerides 0 - 149 mg/dL 43 - -   PHART 7.350 - 7.450 - - 7.333(L)   PCO2ART 32.0 - 48.0 mmHg - - 42.1   HCO3 20.0 - 28.0 mmol/L - 21.9 22.3   TCO2 22 - 32 mmol/L - 23 24   ACIDBASEDEF 0.0 - 2.0 mmol/L - 5.0(H) 3.0(H)   O2SAT % - 64.0 93.0      Capillary Blood Glucose: No results found for: GLUCAP   Exercise Target Goals: Exercise Program Goal: Individual exercise prescription set using  results from initial 6 min walk test and THRR while considering  patient's activity barriers and safety.   Exercise Prescription Goal: Initial exercise prescription builds to 30-45 minutes a day of aerobic activity, 2-3 days per week.  Home exercise guidelines will be given to patient during program as part of exercise prescription that the participant will acknowledge.  Activity Barriers & Risk Stratification: Activity Barriers & Cardiac Risk Stratification - 11/06/17 1436    Activity Barriers & Cardiac Risk Stratification          Activity Barriers  Joint Problems;Deconditioning;Muscular Weakness;Shortness of Breath;Balance Concerns;History of Falls;Other (comment);Arthritis    Comments  Rheumatoid arthritis    Cardiac Risk Stratification  High           6 Minute Walk: 6 Minute Walk    6 Minute Walk    Row  Name 11/06/17 1411 11/06/17 1435 11/06/17 1452   Phase  no documentation  Initial  no documentation   Distance  no documentation  1278 feet  no documentation   Walk Time  no documentation  6 minutes  no documentation   # of Rest Breaks  no documentation  0  no documentation   MPH  no documentation  2.4  no documentation   METS  no documentation  2.6  no documentation   RPE  no documentation  11  no documentation   VO2 Peak  no documentation  9.1  no documentation   Symptoms  no documentation  No  no documentation   Resting HR  no documentation  78 bpm  no documentation   Resting BP  no documentation  118/72  no documentation   Resting Oxygen Saturation   no documentation  99 %  no documentation   Exercise Oxygen Saturation  during 6 min walk  no documentation  100 %  no documentation   Max Ex. HR  no documentation  86 bpm  no documentation   Max Ex. BP  no documentation  124/76  no documentation   2 Minute Post BP  no documentation  no documentation  102/60       Interval Oxygen    Row Name 11/06/17 1411 11/06/17 1435 11/06/17 1452   Interval Oxygen?  Yes  no documentation  no documentation          Oxygen Initial Assessment:   Oxygen Re-Evaluation:   Oxygen Discharge (Final Oxygen Re-Evaluation):   Initial Exercise Prescription: Initial Exercise Prescription - 11/06/17 1300    Date of Initial Exercise RX and Referring Provider          Date  11/06/17    Referring Provider  Martinique, Peter MD        Treadmill          MPH  1.8    Grade  0    Minutes  10    METs  2.38        Recumbant Bike          Level  1.5    Minutes  10    METs  1.8        NuStep          Level  2    SPM  80    Minutes  10    METs  1.8        Prescription Details          Frequency (times per week)  3    Duration  Progress to 30 minutes of continuous  aerobic without signs/symptoms of physical distress        Intensity          THRR 40-80% of Max Heartrate  58-115    Ratings  of Perceived Exertion  11-13    Perceived Dyspnea  0-4        Progression          Progression  Continue to progress workloads to maintain intensity without signs/symptoms of physical distress.        Resistance Training          Training Prescription  Yes    Weight  2lbs    Reps  10-15           Perform Capillary Blood Glucose checks as needed.  Exercise Prescription Changes: Exercise Prescription Changes    Response to Exercise    Row Name 11/10/17 1605 11/26/17 0755 12/17/17 1341   Blood Pressure (Admit)  106/78  102/62  112/72   Blood Pressure (Exercise)  142/80  118/72  160/80   Blood Pressure (Exit)  110/60  100/64  108/80   Heart Rate (Admit)  70 bpm  60 bpm  76 bpm   Heart Rate (Exercise)  104 bpm  107 bpm  117 bpm   Heart Rate (Exit)  70 bpm  60 bpm  76 bpm   Rating of Perceived Exertion (Exercise)  13  13  13    Symptoms  none  none  none   Comments  pt was oriented to exercise equipment today  pt was oriented to exercise equipment today  no documentation   Duration  Continue with 30 min of aerobic exercise without signs/symptoms of physical distress.  Continue with 30 min of aerobic exercise without signs/symptoms of physical distress.  Continue with 30 min of aerobic exercise without signs/symptoms of physical distress.   Intensity  THRR unchanged  THRR unchanged  THRR unchanged       Progression    Row Name 11/10/17 1605 11/26/17 0755 12/17/17 1341   Progression  Continue to progress workloads to maintain intensity without signs/symptoms of physical distress.  Continue to progress workloads to maintain intensity without signs/symptoms of physical distress.  Continue to progress workloads to maintain intensity without signs/symptoms of physical distress.   Average METs  2.1  2.6  3       Resistance Training    Row Name 11/10/17 1605 11/26/17 0755 12/17/17 1341   Training Prescription  Yes  No relaxation day  No relaxation day   Weight  1lbs  no documentation   no documentation   Reps  10-15  no documentation  no documentation   Time  10 Minutes  10 Minutes  10 Minutes       Treadmill    Row Name 11/10/17 1605 11/26/17 0755 12/17/17 1341   MPH  1.8  2.2  2.2   Grade  0  1  1   Minutes  10  10  10    METs  2.38  2.99  2.99       Recumbant [facility] Name 11/10/17 1605 11/26/17 0755 12/17/17 1341   Level  1.5  2.5  3   Minutes  10  10  10    METs  2  2.4  no documentation       [place] Name 11/10/17 1605 11/26/17 0755 12/17/17 1341   Level  2  3  4    SPM  80  80  80   Minutes  10  10  10    METs  1.9  2.3  3.1       [place] Name 11/10/17 1605 11/26/17 0755 12/17/17 1341   Plans to continue exercise at  no documentation  Home (comment) walking  Home (comment) walking   Frequency  no documentation  Add 2 additional days to program exercise sessions.  Add 2 additional days to program exercise sessions.   Initial Home Exercises Provided  no documentation  11/17/17  11/17/17          Exercise Comments: Exercise Comments    Row Name 11/14/17 1607 12/17/17 1341   Exercise Comments  Pt completed first week of exercise at cardiac rehab. Pt was able to exercise for 30 minutes without signs/symptoms of physical distress.   Reviewed METs and goals with patient.      Exercise Goals and Review: Exercise Goals    Exercise Goals    Row Name 11/06/17 1412   Increase Physical Activity  Yes   Intervention  Provide advice, education, support and counseling about physical activity/exercise needs.;Develop an individualized exercise prescription for aerobic and resistive training based on initial evaluation findings, risk stratification, comorbidities and participant's personal goals.   Expected Outcomes  Short Term: Attend rehab on a regular basis to increase amount of physical activity.;Long Term: Exercising regularly at least 3-5 days a week.;Long Term: Add in home exercise to make exercise part of routine and to increase  amount of physical activity.   Increase Strength and Stamina  Yes improve functional fitness   Intervention  Provide advice, education, support and counseling about physical activity/exercise needs.;Develop an individualized exercise prescription for aerobic and resistive training based on initial evaluation findings, risk stratification, comorbidities and participant's personal goals.   Expected Outcomes  Short Term: Increase workloads from initial exercise prescription for resistance, speed, and METs.;Short Term: Perform resistance training exercises routinely during rehab and add in resistance training at home;Long Term: Improve cardiorespiratory fitness, muscular endurance and strength as measured by increased METs and functional capacity (6MWT)   Able to understand and use rate of perceived exertion (RPE) scale  Yes   Intervention  Provide education and explanation on how to use RPE scale   Expected Outcomes  Short Term: Able to use RPE daily in rehab to express subjective intensity level;Long Term:  Able to use RPE to guide intensity level when exercising independently   Knowledge and understanding of Target Heart Rate Range (THRR)  Yes   Intervention  Provide education and explanation of THRR including how the numbers were predicted and where they are located for reference   Expected Outcomes  Short Term: Able to state/look up THRR;Short Term: Able to use daily as guideline for intensity in rehab;Long Term: Able to use THRR to govern intensity when exercising independently   Able to check pulse independently  Yes   Intervention  Provide education and demonstration on how to check pulse in carotid and radial arteries.;Review the importance of being able to check your own pulse for safety during independent exercise   Expected Outcomes  Short Term: Able to explain why pulse checking is important during independent exercise;Long Term: Able to check pulse independently and accurately   Understanding  of Exercise Prescription  Yes   Intervention  Provide education, explanation, and written materials on patient's individual exercise prescription   Expected Outcomes  Short Term: Able to explain program exercise prescription;Long Term: Able to explain home  exercise prescription to exercise independently          Exercise Goals Re-Evaluation : Exercise Goals Re-Evaluation    Exercise Goal Re-Evaluation    Row Name 11/14/17 1608 12/17/17 1341   Exercise Goals Review  Increase Physical Activity;Able to understand and use rate of perceived exertion (RPE) scale;Increase Strength and Stamina  Increase Physical Activity   Comments  Pt able to exercise for 30 minutes without difficulty. Pt also has a good understanding of RPE scale and exercise routine in cardiac rehab.  Patient is walking 25-30 minutes 3 days/week. Patient's goal is to walk daily.   Expected Outcomes  Pt will continue to improve in cardiorespiratory fitness and understand exercise flow/routine in cardiac rehab.  Patient will increase workloads at cardiac rehab to help achieve personal health and fitness goals. Pt will add an additional day to home exercise routine.          Discharge Exercise Prescription (Final Exercise Prescription Changes): Exercise Prescription Changes - 12/17/17 1341    Response to Exercise          Blood Pressure (Admit)  112/72    Blood Pressure (Exercise)  160/80    Blood Pressure (Exit)  108/80    Heart Rate (Admit)  76 bpm    Heart Rate (Exercise)  117 bpm    Heart Rate (Exit)  76 bpm    Rating of Perceived Exertion (Exercise)  13    Symptoms  none    Duration  Continue with 30 min of aerobic exercise without signs/symptoms of physical distress.    Intensity  THRR unchanged        Progression          Progression  Continue to progress workloads to maintain intensity without signs/symptoms of physical distress.    Average METs  3        Resistance Training          Training Prescription   No   relaxation day   Time  10 Minutes        Treadmill          MPH  2.2    Grade  1    Minutes  10    METs  2.99        Recumbant Bike          Level  3    Minutes  10        NuStep          Level  4    SPM  80    Minutes  10    METs  3.1        Home Exercise Plan          Plans to continue exercise at  Home (comment)   walking   Frequency  Add 2 additional days to program exercise sessions.    Initial Home Exercises Provided  11/17/17           Nutrition:  Target Goals: Understanding of nutrition guidelines, daily intake of sodium 1500mg , cholesterol 200mg , calories 30% from fat and 7% or less from saturated fats, daily to have 5 or more servings of fruits and vegetables.  Biometrics: Pre Biometrics - 11/06/17 1452    Pre Biometrics          Height  5' 2.75" (1.594 m)    Weight  53.3 kg    Waist Circumference  31 inches    Hip Circumference  40.5 inches  Waist to Hip Ratio  0.77 %    BMI (Calculated)  20.98    Triceps Skinfold  23 mm    % Body Fat  33.9 %    Grip Strength  18 kg    Flexibility  13.5 in    Single Leg Stand  8.34 seconds            Nutrition Therapy Plan and Nutrition Goals: Nutrition Therapy & Goals - 11/06/17 0743    Nutrition Therapy          Diet  heart healthy        Personal Nutrition Goals          Nutrition Goal  pt to identify and limit food sources of saturated fat, trans fat, sodium    Personal Goal #2  --    Personal Goal #3  --        Intervention Plan          Intervention  Prescribe, educate and counsel regarding individualized specific dietary modifications aiming towards targeted core components such as weight, hypertension, lipid management, diabetes, heart failure and other comorbidities.    Expected Outcomes  Short Term Goal: Understand basic principles of dietary content, such as calories, fat, sodium, cholesterol and nutrients.           Nutrition Assessments: Nutrition Assessments -  11/06/17 0756    MEDFICTS Scores          Pre Score  --           Nutrition Goals Re-Evaluation:   Nutrition Goals Re-Evaluation:   Nutrition Goals Discharge (Final Nutrition Goals Re-Evaluation):   Psychosocial: Target Goals: Acknowledge presence or absence of significant depression and/or stress, maximize coping skills, provide positive support system. Participant is able to verbalize types and ability to use techniques and skills needed for reducing stress and depression.  Initial Review & Psychosocial Screening: Initial Psych Review & Screening - 11/06/17 1534    Initial Review          Current issues with  None Identified;History of Depression        Family Dynamics          Good Support System?  Yes   Lynnox has her husband, daughter and sisters for support       Barriers          Psychosocial barriers to participate in program  There are no identifiable barriers or psychosocial needs.        Screening Interventions          Interventions  Encouraged to exercise    Expected Outcomes  Long Term Goal: Stressors or current issues are controlled or eliminated.;Short Term goal: Identification and review with participant of any Quality of Life or Depression concerns found by scoring the questionnaire.           Quality of Life Scores: Quality of Life - 11/06/17 1342    Quality of Life          Select  Quality of Life        Quality of Life Scores          Health/Function Pre  19.25 %    Socioeconomic Pre  22 %    Psych/Spiritual Pre  22.93 %    Family Pre  25.2 %    GLOBAL Pre  21.53 %          Scores of 19 and below usually indicate a poorer quality of life in  these areas.  A difference of  2-3 points is a clinically meaningful difference.  A difference of 2-3 points in the total score of the Quality of Life Index has been associated with significant improvement in overall quality of life, self-image, physical symptoms, and general health in studies  assessing change in quality of life.  PHQ-9: Recent Review Flowsheet Data    Depression screen [facility] 2/9 11/11/2017   Decreased Interest 1   Down, Depressed, Hopeless 3   PHQ - 2 Score 4   Altered sleeping 2   Tired, decreased energy 2   PHQ-9 Score 8   Difficult doing work/chores Somewhat difficult     Interpretation of Total Score  Total Score Depression Severity:  1-4 = Minimal depression, 5-9 = Mild depression, 10-14 = Moderate depression, 15-19 = Moderately severe depression, 20-27 = Severe depression   Psychosocial Evaluation and Intervention: Psychosocial Evaluation - 11/11/17 0710    Psychosocial Evaluation & Interventions          Interventions  Therapist referral;Stress management education;Relaxation education;Encouraged to exercise with the program and follow exercise prescription    Comments  pt with known depression/anxiety treated with lexapro with multiple stressors. 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 In addition, [place] neighborhood, which has isolated her from her friends and love of gardening. Her recent cardiac event has increased her anxiety. pt is accepting to schedule [facility] appt.      Expected Outcomes  pt will exhibit improved coping skills and outlook.    Continue Psychosocial Services   Follow up required by staff           Psychosocial Re-Evaluation: Psychosocial Re-Evaluation    Psychosocial Re-Evaluation    [place] Name 11/18/17 1224 12/18/17 1555   Current issues with  None Identified  None Identified   Comments  pt with known depression history accepting of offer to seek counseling.  Scheduled appt with Jeanella Craze 11/24/2017.    pt with known depression history accepting of offer to seek counseling.  counseling has been effective. pt demonstrates increased self confidence and decreased health related anxiety. pt able to drive to beach this weekend without difficulty.  this was overcoming a  huge emotional barrier.       Expected Outcomes   pt will exhibit improved outlook with good coping skills.    pt will exhibit improved outlook with good coping skills.    Interventions  Stress management education;Encouraged to attend Cardiac Rehabilitation for the exercise;Relaxation education  Stress management education;Encouraged to attend Cardiac Rehabilitation for the exercise;Relaxation education   Continue Psychosocial Services   no documentation  Follow up required by staff          Psychosocial Discharge (Final Psychosocial Re-Evaluation): Psychosocial Re-Evaluation - 12/18/17 1555    Psychosocial Re-Evaluation          Current issues with  None Identified    Comments  pt with known depression history accepting of offer to seek counseling.  counseling has been effective. pt demonstrates increased self confidence and decreased health related anxiety. pt able to drive to beach this weekend without difficulty.  this was overcoming a huge emotional barrier.        Expected Outcomes   pt will exhibit improved outlook with good coping skills.     Interventions  Stress management education;Encouraged to attend Cardiac Rehabilitation for the exercise;Relaxation education    Continue Psychosocial Services   Follow up required by  staff           Vocational Rehabilitation: Provide vocational rehab assistance to qualifying candidates.   Vocational Rehab Evaluation & Intervention: Vocational Rehab - 11/06/17 1532    Initial Vocational Rehab Evaluation & Intervention          Assessment shows need for Vocational Rehabilitation  No   Jennipher is retired and does not need vocational rehab at this time          Education: Education Goals: Education classes will be provided on a weekly basis, covering required topics. Participant will state understanding/return demonstration of topics presented.  Learning Barriers/Preferences: Learning Barriers/Preferences - 11/06/17 1406     Learning Barriers/Preferences          Learning Barriers  Hearing    Learning Preferences  Skilled Demonstration;Video;Pictoral;Written Material           Education Topics: Count Your Pulse:  -Group instruction provided by verbal instruction, demonstration, patient participation and written materials to support subject.  Instructors address importance of being able to find your pulse and how to count your pulse when at home without a heart monitor.  Patients get hands on experience counting their pulse with staff help and individually. Flowsheet Row CARDIAC REHAB PHASE II EXERCISE from 12/10/2017 in [place]  Date  11/21/17  Instruction Review Code  2- Demonstrated Understanding      Heart Attack, Angina, and Risk Factor Modification:  -Group instruction provided by verbal instruction, video, and written materials to support subject.  Instructors address signs and symptoms of angina and heart attacks.    Also discuss risk factors for heart disease and how to make changes to improve heart health risk factors.   Functional Fitness:  -Group instruction provided by verbal instruction, demonstration, patient participation, and written materials to support subject.  Instructors address safety measures for doing things around the house.  Discuss how to get up and down off the floor, how to pick things up properly, how to safely get out of a chair without assistance, and balance training.   Meditation and Mindfulness:  -Group instruction provided by verbal instruction, patient participation, and written materials to support subject.  Instructor addresses importance of mindfulness and meditation practice to help reduce stress and improve awareness.  Instructor also leads participants through a meditation exercise.  Flowsheet Row CARDIAC REHAB PHASE II EXERCISE from 12/10/2017 in [place]  Date  12/10/17  Educator  Jeanella Craze   Instruction Review Code  2- Demonstrated Understanding      Stretching for Flexibility and Mobility:  -Group instruction provided by verbal instruction, patient participation, and written materials to support subject.  Instructors lead participants through series of stretches that are designed to increase flexibility thus improving mobility.  These stretches are additional exercise for major muscle groups that are typically performed during regular warm up and cool down.   Hands Only CPR:  -Group verbal, video, and participation provides a basic overview of AHA guidelines for community CPR. Role-play of emergencies allow participants the opportunity to practice calling for help and chest compression technique with discussion of AED use.   Hypertension: -Group verbal and written instruction that provides a basic overview of hypertension including the most recent diagnostic guidelines, risk factor reduction with self-care instructions and medication management.    Nutrition I class: Heart Healthy Eating:  -Group instruction provided by PowerPoint slides, verbal discussion, and written materials to support subject matter. The instructor gives an  explanation and review of the Therapeutic Lifestyle Changes diet recommendations, which includes a discussion on lipid goals, dietary fat, sodium, fiber, plant stanol/sterol esters, sugar, and the components of a well-balanced, healthy diet.   Nutrition II class: Lifestyle Skills:  -Group instruction provided by PowerPoint slides, verbal discussion, and written materials to support subject matter. The instructor gives an explanation and review of label reading, grocery shopping for heart health, heart healthy recipe modifications, and ways to make healthier choices when eating out.   Diabetes Question & Answer:  -Group instruction provided by PowerPoint slides, verbal discussion, and written materials to support subject matter. The instructor gives  an explanation and review of diabetes co-morbidities, pre- and post-prandial blood glucose goals, pre-exercise blood glucose goals, signs, symptoms, and treatment of hypoglycemia and hyperglycemia, and foot care basics.   Diabetes Blitz:  -Group instruction provided by PowerPoint slides, verbal discussion, and written materials to support subject matter. The instructor gives an explanation and review of the physiology behind type 1 and type 2 diabetes, diabetes medications and rational behind using different medications, pre- and post-prandial blood glucose recommendations and Hemoglobin A1c goals, diabetes diet, and exercise including blood glucose guidelines for exercising safely.    Portion Distortion:  -Group instruction provided by PowerPoint slides, verbal discussion, written materials, and food models to support subject matter. The instructor gives an explanation of serving size versus portion size, changes in portions sizes over the last 20 years, and what consists of a serving from each food group.   Stress Management:  -Group instruction provided by verbal instruction, video, and written materials to support subject matter.  Instructors review role of stress in heart disease and how to cope with stress positively.     Exercising on Your Own:  -Group instruction provided by verbal instruction, power point, and written materials to support subject.  Instructors discuss benefits of exercise, components of exercise, frequency and intensity of exercise, and end points for exercise.  Also discuss use of nitroglycerin and activating EMS.  Review options of places to exercise outside of rehab.  Review guidelines for sex with heart disease. Flowsheet Row CARDIAC REHAB PHASE II EXERCISE from 12/10/2017 in [facility]  Date  12/03/17  Educator  EP  Instruction Review Code  2- Demonstrated Understanding      Cardiac Drugs I:  -Group instruction provided by verbal  instruction and written materials to support subject.  Instructor reviews cardiac drug classes: antiplatelets, anticoagulants, beta blockers, and statins.  Instructor discusses reasons, side effects, and lifestyle considerations for each drug class.   Cardiac Drugs II:  -Group instruction provided by verbal instruction and written materials to support subject.  Instructor reviews cardiac drug classes: angiotensin converting enzyme inhibitors (ACE-I), angiotensin II receptor blockers (ARBs), nitrates, and calcium channel blockers.  Instructor discusses reasons, side effects, and lifestyle considerations for each drug class.   Anatomy and Physiology of the Circulatory System:  Group verbal and written instruction and models provide basic cardiac anatomy and physiology, with the coronary electrical and arterial systems. Review of: AMI, Angina, Valve disease, Heart Failure, Peripheral Artery Disease, Cardiac Arrhythmia, Pacemakers, and the ICD.   Other Education:  -Group or individual verbal, written, or video instructions that support the educational goals of the cardiac rehab program.   Holiday Eating Survival Tips:  -Group instruction provided by PowerPoint slides, verbal discussion, and written materials to support subject matter. The instructor gives patients tips, tricks, and techniques to help them not only survive but  enjoy the holidays despite the onslaught of food that accompanies the holidays.   Knowledge Questionnaire Score: Knowledge Questionnaire Score - 11/06/17 1342    Knowledge Questionnaire Score          Pre Score  23/24           Core Components/Risk Factors/Patient Goals at Admission: Personal Goals and Risk Factors at Admission - 11/06/17 1453    Core Components/Risk Factors/Patient Goals on Admission           Weight Management  Yes;Weight Maintenance;Weight Loss    Intervention  Weight Management: Develop a combined nutrition and exercise program designed to  reach desired caloric intake, while maintaining appropriate intake of nutrient and fiber, sodium and fats, and appropriate energy expenditure required for the weight goal.;Weight Management: Provide education and appropriate resources to help participant work on and attain dietary goals.    Admit Weight  117 lb 8.1 oz (53.3 kg)    Goal Weight: Short Term  110 lb (49.9 kg)    Goal Weight: Long Term  110 lb (49.9 kg)    Expected Outcomes  Short Term: Continue to assess and modify interventions until short term weight is achieved;Long Term: Adherence to nutrition and physical activity/exercise program aimed toward attainment of established weight goal;Weight Maintenance: Understanding of the daily nutrition guidelines, which includes 25-35% calories from fat, 7% or less cal from saturated fats, less than 200mg  cholesterol, less than 1.5gm of sodium, & 5 or more servings of fruits and vegetables daily;Weight Loss: Understanding of general recommendations for a balanced deficit meal plan, which promotes 1-2 lb weight loss per week and includes a negative energy balance of [phone] kcal/d;Understanding recommendations for meals to include 15-35% energy as protein, 25-35% energy from fat, 35-60% energy from carbohydrates, less than 200mg  of dietary cholesterol, 20-35 gm of total fiber daily;Understanding of distribution of calorie intake throughout the day with the consumption of 4-5 meals/snacks           Core Components/Risk Factors/Patient Goals Review:  Goals and Risk Factor Review    Core Components/Risk Factors/Patient Goals Review    Row Name 11/06/17 1532 11/11/17 0705 11/18/17 1224 12/18/17 1554   Personal Goals Review  Weight Management/Obesity;Hypertension;Lipids  Weight Management/Obesity;Hypertension;Lipids;Stress  Weight Management/Obesity;Hypertension;Lipids;Stress  Weight Management/Obesity;Hypertension;Lipids;Stress   Review  no documentation  pt with multiple CAD RF demonstrates willingness  to participate in CR program.pt personal goals are to increase socialization in addition to build strength/stamina.  pt encouraged to engage in all aspects of CR program including peer interaction, education, exercise and nutrition offerings.   pt with multiple CAD RF demonstrates willingness to participate in CR program.pt personal goals are to increase socialization in addition to build strength/stamina.  pt encouraged to engage in all aspects of CR program including peer interaction, education, exercise and nutrition offerings.   pt with multiple CAD RF demonstrates willingness to participate in CR program.pt personal goals are to increase socialization in addition to build strength/stamina.  pt was able to go on beach trip this weekend without difficulty.   Expected Outcomes  no documentation  pt will participate in CR exercise, nutrition, and lifestyle modification education opportunites while building community with peers.    pt will participate in CR exercise, nutrition, and lifestyle modification education opportunites while building community with peers.    pt will participate in CR exercise, nutrition, and lifestyle modification education opportunites while building community with peers.            Core Components/Risk Factors/Patient  Goals at Discharge (Final Review):  Goals and Risk Factor Review - 12/18/17 1554    Core Components/Risk Factors/Patient Goals Review          Personal Goals Review  Weight Management/Obesity;Hypertension;Lipids;Stress    Review  pt with multiple CAD RF demonstrates willingness to participate in CR program.pt personal goals are to increase socialization in addition to build strength/stamina.  pt was able to go on beach trip this weekend without difficulty.    Expected Outcomes  pt will participate in CR exercise, nutrition, and lifestyle modification education opportunites while building community with peers.             ITP Comments: ITP Comments    Row  Name 11/05/17 1226 11/10/17 0703 11/18/17 1213 12/18/17 1554   ITP Comments  Dr. Fransico Him, Medical Director   pt started group exercise program.  pt tolerated light activity without difficulty. pt oriented to exercise equipment and safety routine.   30 day ITP review. pt demonstrates willingness to participate in CR activities.   30 day ITP review. pt demonstrates willingness to participate in CR activities. pt demonstrates increased eagerness to participate in CRprogram.      Comments:

## 2017-12-19 ENCOUNTER — Encounter (HOSPITAL_COMMUNITY): Payer: Medicare Other

## 2017-12-19 ENCOUNTER — Encounter (HOSPITAL_COMMUNITY)
Admission: RE | Admit: 2017-12-19 | Discharge: 2017-12-19 | Disposition: A | Discharge status: Home / Self Care | Payer: Medicare Other | Source: Ambulatory Visit | Attending: Cardiology | Admitting: Cardiology

## 2017-12-19 DIAGNOSIS — Z955 Presence of coronary angioplasty implant and graft: Secondary | ICD-10-CM

## 2017-12-22 ENCOUNTER — Encounter (HOSPITAL_COMMUNITY)
Admission: RE | Admit: 2017-12-22 | Discharge: 2017-12-22 | Disposition: A | Discharge status: Home / Self Care | Payer: Medicare Other | Source: Ambulatory Visit | Attending: Cardiology | Admitting: Cardiology

## 2017-12-22 ENCOUNTER — Encounter (HOSPITAL_COMMUNITY): Payer: Medicare Other

## 2017-12-22 DIAGNOSIS — Z955 Presence of coronary angioplasty implant and graft: Secondary | ICD-10-CM | POA: Diagnosis not present

## 2017-12-24 ENCOUNTER — Encounter (HOSPITAL_COMMUNITY): Payer: Medicare Other

## 2017-12-24 ENCOUNTER — Encounter (HOSPITAL_COMMUNITY)
Admission: RE | Admit: 2017-12-24 | Discharge: 2017-12-24 | Disposition: A | Discharge status: Home / Self Care | Payer: Medicare Other | Source: Ambulatory Visit | Attending: Cardiology | Admitting: Cardiology

## 2017-12-24 DIAGNOSIS — Z955 Presence of coronary angioplasty implant and graft: Secondary | ICD-10-CM

## 2017-12-26 ENCOUNTER — Encounter (HOSPITAL_COMMUNITY): Payer: Medicare Other

## 2017-12-26 ENCOUNTER — Encounter (HOSPITAL_COMMUNITY)
Admission: RE | Admit: 2017-12-26 | Discharge: 2017-12-26 | Disposition: A | Discharge status: Home / Self Care | Payer: Medicare Other | Source: Ambulatory Visit | Attending: Cardiology | Admitting: Cardiology

## 2017-12-26 DIAGNOSIS — Z955 Presence of coronary angioplasty implant and graft: Secondary | ICD-10-CM

## 2017-12-27 NOTE — Progress Notes (Signed)
Cardiology Office Note   Date:  12/27/2017   ID:  Elizabeth Estrada, DOB 22-Sep-1940, MRN [id number]  PCP:  [name], MD  Cardiologist:   Peter Martinique, MD   No chief complaint on file.     History of Present Illness: Elizabeth Estrada is a 77 y.o. female who is seen for follow up CAD and complete heart block.   She has a history of MVP and family history of CAD. She has a history of murmur. Echo in 2006 mentions mild prolapse but in 2013 she had an Echo showing a small pericardial effusion otherwise normal. No prolapse seen. She also had a normal stress Echo at that time.   She was seen in January 2019 with some palpitations. Seen again in April with symptoms of exertional chest pressure. Echo showed mild to moderate mitral stenosis. ETT was done and demonstrated very poor exercise tolerance with hypertensive BP response. There was 2:1 AV block noted. An event monitor showed episodes of complete heart block. She underwent right and left heart cath. This showed an 80% ostial RCA stenosis and 70% diagonal. There was mild pulmonary HTN without significant mitral stenosis. EDP was elevated c/w diastolic dysfunction. She underwent stenting of the ostial RCA with DES. She was placed on DAPT. Post stent she had persistent episodes of CHB despite correcting ischemia and underwent DDD pacemaker placement on 08/11/17.  Seen in the device clinic on 5/14. RV threshold was elevated. CXR showed no change in lead position.  She was seen by [name] in August. Had persistent SOB. Repeat Echo showed no change. 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   On follow up she is doing much better. Still notes some SOB and fatigue when walking up hill but able to walk on a treadmill without difficulty. She is participating in [place].   Denies any chest pain. No edema. Energy level is good.     Past Medical History:  Diagnosis Date  . AV block    a. s/p MDT dual chamber PPM (HIS  bundle lead)  . Chronic depression   . Coronary artery disease   . Family history of adverse reaction to anesthesia    "dad had PONV"  . Fibromyalgia   . GERD (gastroesophageal reflux disease)   . Heart murmur   . History of kidney stones   . Hypertension    "before stent and pacemaker" (10/13/2017)  . Melanoma ([place])    removed from back  . MVP (mitral valve prolapse)   . Osteoarthritis   . Osteoporosis   . 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 RA (rheumatoid arthritis) ([place])    "all over; mainly hands and knees" (10/13/2017)  . Rib fracture     Past Surgical History:  Procedure Laterality Date  . BREAST BIOPSY Left 11/2003   benign - Dr. Margot Chimes  . CARDIAC CATHETERIZATION    . CARDIOVASCULAR STRESS TEST  10/19/1999, 2013   EF 97%, NO EVIDENCE OF ISCHEMIA, 2013 NORMAL  . CATARACT EXTRACTION W/ INTRAOCULAR LENS  IMPLANT, BILATERAL Bilateral   . COLONOSCOPY    . CORONARY STENT INTERVENTION N/A 08/08/2017   Procedure: CORONARY STENT INTERVENTION;  Surgeon: [name], MD;  Location: [facility];  Service: Cardiovascular;  Laterality: N/A;  . DILATATION & CURRETTAGE/HYSTEROSCOPY WITH RESECTOCOPE N/A 07/06/2013   Procedure: DILATATION & CURETTAGE/HYSTEROSCOPY WITH RESECTOCOPE with removal of endometrial lesion;  Surgeon: [name], MD;  Location: [facility];  Service: Gynecology;  Laterality: N/A;  . LEAD REVISION/REPAIR N/A 10/13/2017   Procedure: LEAD REVISION/REPAIR;  Surgeon: [name], MD;  Location: [facility];  Service: Cardiovascular;  Laterality: N/A;  . MELANOMA EXCISION  1990s?   from back  . PACEMAKER IMPLANT N/A 08/11/2017   Procedure: PACEMAKER IMPLANT;  Surgeon: [name], MD;  Location: [facility];  Service: Cardiovascular;  Laterality: N/A;  . RIGHT/LEFT HEART CATH AND CORONARY ANGIOGRAPHY N/A 08/08/2017   Procedure: RIGHT/LEFT HEART CATH AND CORONARY ANGIOGRAPHY;  Surgeon: [name], MD;  Location:  [facility];  Service: Cardiovascular;  Laterality: N/A;     Current Outpatient Medications  Medication Sig Dispense Refill  . abatacept (ORENCIA) 250 MG injection Inject 250 mg into the vein every 30 (thirty) days.     Marland Kitchen aspirin 81 MG tablet Take 81 mg by mouth every other day.     Marland Kitchen atorvastatin (LIPITOR) 80 MG tablet Take 1 tablet (80 mg total) by mouth daily at 6 PM. 30 tablet 5  . cetirizine (ZYRTEC) 10 MG tablet Take 10 mg by mouth daily as needed for allergies.    . cholecalciferol (VITAMIN D) 1000 UNITS tablet Take 1,000 Units by mouth daily.    . clopidogrel (PLAVIX) 75 MG tablet Take 1 tablet (75 mg total) by mouth daily with breakfast. 30 tablet 11  . escitalopram (LEXAPRO) 10 MG tablet Take 10 mg by mouth at bedtime.     . fluticasone (FLONASE) 50 MCG/ACT nasal spray Place 1-2 sprays into both nostrils daily as needed for allergies or rhinitis.    . Polyvinyl Alcohol-Povidone (REFRESH OP) Place 1-2 drops into both eyes daily as needed (for dry eyes).     No current facility-administered medications for this visit.     Allergies:   Methotrexate derivatives; Plaquenil [hydroxychloroquine sulfate]; Remicade [infliximab]; Ridaura [auranofin]; and Sudafed [pseudoephedrine]    Social History:  The patient  reports that she has never smoked. She has never used smokeless tobacco. She reports that she does not drink alcohol or use drugs.   Family History:  The patient's family history includes CAD (age of onset: 5) in her brother; Coronary artery disease in her mother; Heart disease in her father; Hypertension in her father; Pancreatic cancer in her father.    ROS:  Please see the history of present illness.  . Otherwise, review of systems are positive for none.   All other systems are reviewed and negative.    PHYSICAL EXAM: VS:  LMP 04/15/1990  , BMI There is no height or weight on file to calculate BMI. GENERAL:  Well appearing WF in NAD HEENT:  PERRL, EOMI, sclera are  clear. Oropharynx is clear. NECK:  No jugular venous distention, carotid upstroke brisk and symmetric, no bruits, no thyromegaly or adenopathy LUNGS:  Clear to auscultation bilaterally CHEST:  Unremarkable HEART:  RRR,  PMI not displaced or sustained,S1 and S2 within normal limits, no S3, no S4: no clicks, no rubs, no murmurs ABD:  Soft, nontender. BS +, no masses or bruits. No hepatomegaly, no splenomegaly EXT:  2 + pulses throughout, no edema, no cyanosis no clubbing SKIN:  Warm and dry.  No rashes NEURO:  Alert and oriented x 3. Cranial nerves II through XII intact. PSYCH:  Cognitively intact       EKG:  EKG is not ordered today.    Recent Labs: Lab Results  Component Value Date   WBC 6.3 09/30/2017  HGB 13.1 09/30/2017   HCT 39.9 09/30/2017   PLT 161 09/30/2017   GLUCOSE 75 09/30/2017   CHOL 227 (H) 07/14/2017   TRIG 43 07/14/2017   HDL 91 07/14/2017   LDLCALC 127 (H) 07/14/2017   ALT 15 08/13/2017   AST 34 08/13/2017   NA 142 09/30/2017   K 4.2 09/30/2017   CL 106 09/30/2017   CREATININE 0.70 09/30/2017   BUN 15 09/30/2017   CO2 22 09/30/2017   TSH 1.610 07/14/2017   INR 1.03 08/13/2017     Lipid Panel    Component Value Date/Time   CHOL 227 (H) 07/14/2017 1214   TRIG 43 07/14/2017 1214   HDL 91 07/14/2017 1214   CHOLHDL 2.5 07/14/2017 1214   LDLCALC 127 (H) 07/14/2017 1214      Wt Readings from Last 3 Encounters:  11/18/17 117 lb 6.4 oz (53.3 kg)  11/06/17 117 lb 8.1 oz (53.3 kg)  10/13/17 118 lb (53.5 kg)      Other studies Reviewed: Additional studies/ records that were reviewed today include: Event monitor 07/24/17: Study Highlights    Normal sinus rhythm  Periods of intermittent complete heart block and Type 2 second degree AV block with 2:1 conduction   Echo 07/31/17: Study Conclusions  - Left ventricle: The cavity size was normal. Wall thickness was   normal. Systolic function was normal. The estimated ejection   fraction was in  the range of 60% to 65%. Wall motion was normal;   there were no regional wall motion abnormalities. Doppler   parameters are consistent with abnormal left ventricular   relaxation (grade 1 diastolic dysfunction). - Aortic valve: There was no stenosis. - Mitral valve: The findings are consistent with moderate stenosis.   There was no significant regurgitation. Mean gradient (D): 8 mm   Hg. Valve area by pressure half-time: 1.49 cm^2. - Left atrium: The atrium was moderately dilated. - Right ventricle: The cavity size was normal. Systolic function   was normal. - Tricuspid valve: Peak RV-RA gradient (S): 26 mm Hg. - Pulmonary arteries: PA peak pressure: 29 mm Hg (S). - Inferior vena cava: The vessel was normal in size. The   respirophasic diameter changes were in the normal range (>= 50%),   consistent with normal central venous pressure. - Pericardium, extracardiac: Small to moderate pericardial   effusion. < 25% respirophasic variation of mitral E inflow   velocity. IVC is not dilated. No significant RV diastolic   collapse. No pericardial tamponade.  Impressions:  - Normal LV size with EF 60-65%. Normal RV size and systolic   function. 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 There was a small to moderate pericardial   effusion without tamponade.  ETT 08/05/17: Study Highlights     Blood pressure demonstrated a hypertensive response to exercise.  There was no ST segment deviation noted during stress.   Abnormal ETT with poor exercise tolerance (1:01); pt complained of chest discomfort during the study; hypertensive BP response; pt developed 2:1 AV block with exertion (that correlated with chest pain) that resolved in recovery; no ST changes. Pt reviewed with Dr Curt Bears prior to DC.    Cardiac cath/PCI: 08/08/17: Conclusion    80% ostial RCA.  The RCA is dominant.  30 to 40% proximal and mid LAD.  70% proximal diagonal #1.  Widely  patent circumflex with tortuous obtuse marginals.  Normal left main  Calcified mitral valve and annulus.  No significant mitral stenosis is noted.  Mild pulmonary hypertension  Normal left ventricular systolic function with elevated end-diastolic and pulmonary wedge pressure in the 20 to 22 mmHg range consistent with diastolic heart failure, chronic.  Successful PCI and stent of the ostial RCA from 80% with TIMI grade III flow to less than 10% with TIMI grade III flow using a 3.0 x 12 mm Synergy postdilated to 3.5 mmHg pressure.  RECOMMENDATIONS:   Aspirin and Plavix for at least 6 months.  Pacemaker may be needed if continued prolonged episodes of second-degree heart block with bradycardia.  Aggressive risk factor modification.  Probable discharge in a.m. if no complications.      Pacemaker implant 08/11/17: CONCLUSIONS:   1. Successful implantation of a Medtronic Azure XT DR MRI SureScan dual-chamber pacemaker for symptomatic bradycardia  2. No early apparent complications.     Echo 12/05/17: Study Conclusions  - Left ventricle: The cavity size was normal. Systolic function was   vigorous. The estimated ejection fraction was in the range of 65%   to 70%. Wall motion was normal; there were no regional wall   motion abnormalities. Doppler parameters are consistent with   abnormal left ventricular relaxation (grade 1 diastolic   dysfunction). Doppler parameters are consistent with elevated   ventricular end-diastolic filling pressure. - Mitral valve: Moderately thickened, moderately calcified leaflets   . The findings are consistent with moderate stenosis. There was   mild regurgitation. Mean gradient (D): 7 mm Hg. Valve area by   continuity equation (using LVOT flow): 1.17 cm^2. - Left atrium: The atrium was mildly dilated. - Right ventricle: Systolic function was normal. - Right atrium: The atrium was mildly dilated. - Tricuspid valve: There was mild regurgitation. -  Pericardium, extracardiac: A mild to moderate circumferential   pericardial effusion was identified. Features were not consistent   with tamponade physiology.  Impressions:  - No significant change since the prior study on 07/31/2017.    ASSESSMENT AND PLAN:  1. CAD. S/p DES of ostial RCA on 08/08/17. Recommend continued DAPT for one year so will stop Plavix in April.  Continue statin. Encouraged continued exercise program. Will treat residual disease in a diagonal branch medically.  2. CHB s/p DDD pacemaker insertion on 08/11/17. Follow up with EP 3. Mild-moderate  Mitral stenosis on Echo. Right heart cath without significant stenosis/vavle gradient 4. Diastolic dysfunction- minimally symptomatic.  5. RA. Follow up with  Dr. Amil Amen.   Current medicines are reviewed at length with the patient today.  The patient does not have concerns regarding medicines.  The following changes have been made:  no change  Labs/ tests ordered today include:   No orders of the defined types were placed in this encounter.    Disposition:   FU with me 6 months  Signed, Peter Martinique, MD  12/27/2017 11:27 AM    [facility] [address], [place], [place], [address] Phone [phone], Fax [phone]

## 2017-12-29 ENCOUNTER — Encounter (HOSPITAL_COMMUNITY)
Admission: RE | Admit: 2017-12-29 | Discharge: 2017-12-29 | Disposition: A | Discharge status: Home / Self Care | Payer: Medicare Other | Source: Ambulatory Visit | Attending: Cardiology | Admitting: Cardiology

## 2017-12-29 ENCOUNTER — Encounter (HOSPITAL_COMMUNITY): Payer: Medicare Other

## 2017-12-29 DIAGNOSIS — Z955 Presence of coronary angioplasty implant and graft: Secondary | ICD-10-CM

## 2017-12-30 ENCOUNTER — Encounter: Payer: Self-pay | Admitting: Cardiology

## 2017-12-30 ENCOUNTER — Ambulatory Visit (INDEPENDENT_AMBULATORY_CARE_PROVIDER_SITE_OTHER): Payer: Medicare Other | Admitting: Cardiology

## 2017-12-30 VITALS — BP 115/72 | HR 59 | Ht 62.5 in | Wt 118.0 lb

## 2017-12-30 DIAGNOSIS — I2510 Atherosclerotic heart disease of native coronary artery without angina pectoris: Secondary | ICD-10-CM

## 2017-12-30 DIAGNOSIS — R0602 Shortness of breath: Secondary | ICD-10-CM | POA: Diagnosis not present

## 2017-12-30 DIAGNOSIS — Z9861 Coronary angioplasty status: Secondary | ICD-10-CM | POA: Diagnosis not present

## 2017-12-30 DIAGNOSIS — Z950 Presence of cardiac pacemaker: Secondary | ICD-10-CM

## 2017-12-30 DIAGNOSIS — I442 Atrioventricular block, complete: Secondary | ICD-10-CM

## 2017-12-30 DIAGNOSIS — I050 Rheumatic mitral stenosis: Secondary | ICD-10-CM | POA: Diagnosis not present

## 2017-12-30 NOTE — Patient Instructions (Signed)
Continue your current therapy  I will see you in 6 months.   

## 2017-12-31 ENCOUNTER — Encounter (HOSPITAL_COMMUNITY)
Admission: RE | Admit: 2017-12-31 | Discharge: 2017-12-31 | Disposition: A | Discharge status: Home / Self Care | Payer: Medicare Other | Source: Ambulatory Visit | Attending: Cardiology | Admitting: Cardiology

## 2017-12-31 ENCOUNTER — Encounter (HOSPITAL_COMMUNITY): Payer: Medicare Other

## 2017-12-31 DIAGNOSIS — Z955 Presence of coronary angioplasty implant and graft: Secondary | ICD-10-CM | POA: Diagnosis not present

## 2018-01-02 ENCOUNTER — Encounter (HOSPITAL_COMMUNITY): Payer: Medicare Other

## 2018-01-02 ENCOUNTER — Encounter (HOSPITAL_COMMUNITY)
Admission: RE | Admit: 2018-01-02 | Discharge: 2018-01-02 | Disposition: A | Discharge status: Home / Self Care | Payer: Medicare Other | Source: Ambulatory Visit | Attending: Cardiology | Admitting: Cardiology

## 2018-01-02 DIAGNOSIS — M0579 Rheumatoid arthritis with rheumatoid factor of multiple sites without organ or systems involvement: Secondary | ICD-10-CM | POA: Diagnosis not present

## 2018-01-02 DIAGNOSIS — Z955 Presence of coronary angioplasty implant and graft: Secondary | ICD-10-CM | POA: Diagnosis not present

## 2018-01-02 DIAGNOSIS — Z79899 Other long term (current) drug therapy: Secondary | ICD-10-CM | POA: Diagnosis not present

## 2018-01-05 ENCOUNTER — Encounter (HOSPITAL_COMMUNITY): Payer: Medicare Other

## 2018-01-05 ENCOUNTER — Encounter (HOSPITAL_COMMUNITY)
Admission: RE | Admit: 2018-01-05 | Discharge: 2018-01-05 | Disposition: A | Discharge status: Home / Self Care | Payer: Medicare Other | Source: Ambulatory Visit | Attending: Cardiology | Admitting: Cardiology

## 2018-01-05 DIAGNOSIS — Z955 Presence of coronary angioplasty implant and graft: Secondary | ICD-10-CM | POA: Diagnosis not present

## 2018-01-07 ENCOUNTER — Encounter (HOSPITAL_COMMUNITY): Payer: Medicare Other

## 2018-01-07 ENCOUNTER — Encounter (HOSPITAL_COMMUNITY)
Admission: RE | Admit: 2018-01-07 | Discharge: 2018-01-07 | Disposition: A | Discharge status: Home / Self Care | Payer: Medicare Other | Source: Ambulatory Visit | Attending: Cardiology | Admitting: Cardiology

## 2018-01-07 DIAGNOSIS — K219 Gastro-esophageal reflux disease without esophagitis: Secondary | ICD-10-CM | POA: Diagnosis not present

## 2018-01-07 DIAGNOSIS — E7800 Pure hypercholesterolemia, unspecified: Secondary | ICD-10-CM | POA: Diagnosis not present

## 2018-01-07 DIAGNOSIS — Z950 Presence of cardiac pacemaker: Secondary | ICD-10-CM | POA: Diagnosis not present

## 2018-01-07 DIAGNOSIS — Z955 Presence of coronary angioplasty implant and graft: Secondary | ICD-10-CM | POA: Diagnosis not present

## 2018-01-07 DIAGNOSIS — I442 Atrioventricular block, complete: Secondary | ICD-10-CM | POA: Diagnosis not present

## 2018-01-07 DIAGNOSIS — I2510 Atherosclerotic heart disease of native coronary artery without angina pectoris: Secondary | ICD-10-CM | POA: Diagnosis not present

## 2018-01-07 DIAGNOSIS — M1990 Unspecified osteoarthritis, unspecified site: Secondary | ICD-10-CM | POA: Diagnosis not present

## 2018-01-07 DIAGNOSIS — Z23 Encounter for immunization: Secondary | ICD-10-CM | POA: Diagnosis not present

## 2018-01-07 DIAGNOSIS — Z1389 Encounter for screening for other disorder: Secondary | ICD-10-CM | POA: Diagnosis not present

## 2018-01-07 DIAGNOSIS — I208 Other forms of angina pectoris: Secondary | ICD-10-CM | POA: Diagnosis not present

## 2018-01-07 DIAGNOSIS — F322 Major depressive disorder, single episode, severe without psychotic features: Secondary | ICD-10-CM | POA: Diagnosis not present

## 2018-01-07 DIAGNOSIS — D692 Other nonthrombocytopenic purpura: Secondary | ICD-10-CM | POA: Diagnosis not present

## 2018-01-07 DIAGNOSIS — M069 Rheumatoid arthritis, unspecified: Secondary | ICD-10-CM | POA: Diagnosis not present

## 2018-01-08 DIAGNOSIS — L821 Other seborrheic keratosis: Secondary | ICD-10-CM | POA: Diagnosis not present

## 2018-01-08 DIAGNOSIS — L814 Other melanin hyperpigmentation: Secondary | ICD-10-CM | POA: Diagnosis not present

## 2018-01-08 DIAGNOSIS — L738 Other specified follicular disorders: Secondary | ICD-10-CM | POA: Diagnosis not present

## 2018-01-08 DIAGNOSIS — Z85820 Personal history of malignant melanoma of skin: Secondary | ICD-10-CM | POA: Diagnosis not present

## 2018-01-08 DIAGNOSIS — D225 Melanocytic nevi of trunk: Secondary | ICD-10-CM | POA: Diagnosis not present

## 2018-01-08 DIAGNOSIS — D1801 Hemangioma of skin and subcutaneous tissue: Secondary | ICD-10-CM | POA: Diagnosis not present

## 2018-01-08 DIAGNOSIS — D2272 Melanocytic nevi of left lower limb, including hip: Secondary | ICD-10-CM | POA: Diagnosis not present

## 2018-01-08 DIAGNOSIS — D692 Other nonthrombocytopenic purpura: Secondary | ICD-10-CM | POA: Diagnosis not present

## 2018-01-09 ENCOUNTER — Encounter (HOSPITAL_COMMUNITY)
Admission: RE | Admit: 2018-01-09 | Discharge: 2018-01-09 | Disposition: A | Discharge status: Home / Self Care | Payer: Medicare Other | Source: Ambulatory Visit | Attending: Cardiology | Admitting: Cardiology

## 2018-01-09 ENCOUNTER — Encounter (HOSPITAL_COMMUNITY): Payer: Medicare Other

## 2018-01-09 DIAGNOSIS — Z955 Presence of coronary angioplasty implant and graft: Secondary | ICD-10-CM

## 2018-01-12 ENCOUNTER — Encounter (HOSPITAL_COMMUNITY)
Admission: RE | Admit: 2018-01-12 | Discharge: 2018-01-12 | Disposition: A | Discharge status: Home / Self Care | Payer: Medicare Other | Source: Ambulatory Visit | Attending: Cardiology | Admitting: Cardiology

## 2018-01-12 ENCOUNTER — Encounter (HOSPITAL_COMMUNITY): Payer: Medicare Other

## 2018-01-12 DIAGNOSIS — Z955 Presence of coronary angioplasty implant and graft: Secondary | ICD-10-CM

## 2018-01-14 ENCOUNTER — Encounter (HOSPITAL_COMMUNITY)
Admission: RE | Admit: 2018-01-14 | Discharge: 2018-01-14 | Disposition: A | Discharge status: Home / Self Care | Payer: Medicare Other | Source: Ambulatory Visit | Attending: Cardiology | Admitting: Cardiology

## 2018-01-14 ENCOUNTER — Encounter (HOSPITAL_COMMUNITY): Payer: Medicare Other

## 2018-01-14 DIAGNOSIS — Z955 Presence of coronary angioplasty implant and graft: Secondary | ICD-10-CM | POA: Diagnosis not present

## 2018-01-15 NOTE — Progress Notes (Signed)
Cardiac Individual Treatment Plan  Patient Details  Name: Elizabeth Estrada MRN: [id number] Date of Birth: [birth year] Referring Provider:   Flowsheet Row CARDIAC REHAB PHASE II ORIENTATION from 11/06/2017 in [place]  Referring Provider  Martinique, Peter MD      Initial Encounter Date:  [place] PHASE II ORIENTATION from 11/06/2017 in [facility]  Date  11/06/17      Visit Diagnosis: 08/08/2017 Stented coronary artery, S/P DES RCA  Patient's Home Medications on Admission:  Current Outpatient Medications:  .  abatacept (ORENCIA) 250 MG injection, Inject 250 mg into the vein every 30 (thirty) days. , Disp: , Rfl:  .  aspirin 81 MG tablet, Take 81 mg by mouth every other day. , Disp: , Rfl:  .  atorvastatin (LIPITOR) 80 MG tablet, Take 1 tablet (80 mg total) by mouth daily at 6 PM., Disp: 30 tablet, Rfl: 5 .  cetirizine (ZYRTEC) 10 MG tablet, Take 10 mg by mouth daily as needed for allergies., Disp: , Rfl:  .  cholecalciferol (VITAMIN D) 1000 UNITS tablet, Take 1,000 Units by mouth daily., Disp: , Rfl:  .  clopidogrel (PLAVIX) 75 MG tablet, Take 1 tablet (75 mg total) by mouth daily with breakfast., Disp: 30 tablet, Rfl: 11 .  escitalopram (LEXAPRO) 10 MG tablet, Take 10 mg by mouth at bedtime. , Disp: , Rfl:  .  fluticasone (FLONASE) 50 MCG/ACT nasal spray, Place 1-2 sprays into both nostrils daily as needed for allergies or rhinitis., Disp: , Rfl:  .  Polyvinyl Alcohol-Povidone (REFRESH OP), Place 1-2 drops into both eyes daily as needed (for dry eyes)., Disp: , Rfl:   Past Medical History: Past Medical History:  Diagnosis Date  . 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 Chronic depression   . Coronary artery disease   . Family history of adverse reaction to anesthesia    "dad had PONV"  . Fibromyalgia   . GERD (gastroesophageal reflux disease)   . Heart murmur   . History of  kidney stones   . Hypertension    "before stent and pacemaker" (10/13/2017)  . Melanoma ([place])    removed from back  . MVP (mitral valve prolapse)   . Osteoarthritis   . Osteoporosis   . 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 RA (rheumatoid arthritis) ([place])    "all over; mainly hands and knees" (10/13/2017)  . Rib fracture     Tobacco Use: Social History   Tobacco Use  Smoking Status Never Smoker  Smokeless Tobacco Never Used    Labs: Recent Review Flowsheet Data    Labs for ITP Cardiac and Pulmonary Rehab Latest Ref Rng & Units 07/14/2017 08/08/2017 08/08/2017   Cholestrol 100 - 199 mg/dL 227(H) - -   LDLCALC 0 - 99 mg/dL 127(H) - -   HDL >39 mg/dL 91 - -   Trlycerides 0 - 149 mg/dL 43 - -   PHART 7.350 - 7.450 - - 7.333(L)   PCO2ART 32.0 - 48.0 mmHg - - 42.1   HCO3 20.0 - 28.0 mmol/L - 21.9 22.3   TCO2 22 - 32 mmol/L - 23 24   ACIDBASEDEF 0.0 - 2.0 mmol/L - 5.0(H) 3.0(H)   O2SAT % - 64.0 93.0      Capillary Blood Glucose: No results found for: GLUCAP   Exercise Target Goals: Exercise Program Goal: Individual exercise prescription set using  results from initial 6 min walk test and THRR while considering  patient's activity barriers and safety.   Exercise Prescription Goal: Initial exercise prescription builds to 30-45 minutes a day of aerobic activity, 2-3 days per week.  Home exercise guidelines will be given to patient during program as part of exercise prescription that the participant will acknowledge.  Activity Barriers & Risk Stratification: Activity Barriers & Cardiac Risk Stratification - 11/06/17 1436    Activity Barriers & Cardiac Risk Stratification          Activity Barriers  Joint Problems;Deconditioning;Muscular Weakness;Shortness of Breath;Balance Concerns;History of Falls;Other (comment);Arthritis    Comments  Rheumatoid arthritis    Cardiac Risk Stratification  High           6 Minute Walk: 6 Minute Walk    6 Minute Walk    Row  Name 11/06/17 1411 11/06/17 1435 11/06/17 1452   Phase  no documentation  Initial  no documentation   Distance  no documentation  1278 feet  no documentation   Walk Time  no documentation  6 minutes  no documentation   # of Rest Breaks  no documentation  0  no documentation   MPH  no documentation  2.4  no documentation   METS  no documentation  2.6  no documentation   RPE  no documentation  11  no documentation   VO2 Peak  no documentation  9.1  no documentation   Symptoms  no documentation  No  no documentation   Resting HR  no documentation  78 bpm  no documentation   Resting BP  no documentation  118/72  no documentation   Resting Oxygen Saturation   no documentation  99 %  no documentation   Exercise Oxygen Saturation  during 6 min walk  no documentation  100 %  no documentation   Max Ex. HR  no documentation  86 bpm  no documentation   Max Ex. BP  no documentation  124/76  no documentation   2 Minute Post BP  no documentation  no documentation  102/60       Interval Oxygen    Row Name 11/06/17 1411 11/06/17 1435 11/06/17 1452   Interval Oxygen?  Yes  no documentation  no documentation          Oxygen Initial Assessment:   Oxygen Re-Evaluation:   Oxygen Discharge (Final Oxygen Re-Evaluation):   Initial Exercise Prescription: Initial Exercise Prescription - 11/06/17 1300    Date of Initial Exercise RX and Referring Provider          Date  11/06/17    Referring Provider  Martin[place], Peter MD        Treadmill          MPH  1.8    Grade  0    Minutes  10    METs  2.38        Recumbant Bike          Level  1.5    Minutes  10    METs  1.8        NuStep          Level  2    SPM  80    Minutes  10    METs  1.8        Prescription Details          Frequency (times per week)  3    Duration  Progress to 30 minutes of continuous  aerobic without signs/symptoms of physical distress        Intensity          THRR 40-80% of Max Heartrate  58-115    Ratings  of Perceived Exertion  11-13    Perceived Dyspnea  0-4        Progression          Progression  Continue to progress workloads to maintain intensity without signs/symptoms of physical distress.        Resistance Training          Training Prescription  Yes    Weight  2lbs    Reps  10-15           Perform Capillary Blood Glucose checks as needed.  Exercise Prescription Changes: Exercise Prescription Changes    Response to Exercise    Row Name 11/10/17 1605 11/26/17 0755 12/17/17 1341 12/31/17 1323 01/12/18 1326   Blood Pressure (Admit)  106/78  102/62  112/72  100/68  122/78   Blood Pressure (Exercise)  142/80  118/72  160/80  120/60  132/70   Blood Pressure (Exit)  110/60  100/64  108/80  100/60  122/82   Heart Rate (Admit)  70 bpm  60 bpm  76 bpm  78 bpm  64 bpm   Heart Rate (Exercise)  104 bpm  107 bpm  117 bpm  110 bpm  102 bpm   Heart Rate (Exit)  70 bpm  60 bpm  76 bpm  68 bpm  70 bpm   Rating of Perceived Exertion (Exercise)  13  13  13  13  13    Symptoms  none  none  none  none  none   Comments  pt was oriented to exercise equipment today  pt was oriented to exercise equipment today  no documentation  no documentation  no documentation   Duration  Continue with 30 min of aerobic exercise without signs/symptoms of physical distress.  Continue with 30 min of aerobic exercise without signs/symptoms of physical distress.  Continue with 30 min of aerobic exercise without signs/symptoms of physical distress.  Continue with 30 min of aerobic exercise without signs/symptoms of physical distress.  Continue with 30 min of aerobic exercise without signs/symptoms of physical distress.   Intensity  THRR unchanged  THRR unchanged  THRR unchanged  THRR unchanged  THRR unchanged       Progression    Row Name 11/10/17 1605 11/26/17 0755 12/17/17 1341 12/31/17 1323 01/12/18 1326   Progression  Continue to progress workloads to maintain intensity without signs/symptoms of physical  distress.  Continue to progress workloads to maintain intensity without signs/symptoms of physical distress.  Continue to progress workloads to maintain intensity without signs/symptoms of physical distress.  Continue to progress workloads to maintain intensity without signs/symptoms of physical distress.  Continue to progress workloads to maintain intensity without signs/symptoms of physical distress.   Average METs  2.1  2.6  3  3.2  3.3       Resistance Training    Row Name 11/10/17 1605 11/26/17 0755 12/17/17 1341 12/31/17 1323 01/12/18 1326   Training Prescription  Yes  No relaxation day  No relaxation day  No relaxation day  Yes   Weight  1lbs  no documentation  no documentation  no documentation  3lbs   Reps  10-15  no documentation  no documentation  no documentation  10-15   Time  10 Minutes  10 Minutes  10 Minutes  10  Minutes  10 Minutes       Interval Training    Row Name 11/10/17 1605 11/26/17 0755 12/17/17 1341 12/31/17 1323 01/12/18 1326   Interval Training  no documentation  no documentation  no documentation  No  No       Treadmill    Row Name 11/10/17 1605 11/26/17 0755 12/17/17 1341 12/31/17 1323 01/12/18 1326   MPH  1.8  2.2  2.2  2.5  2.5   Grade  0  1  1  2  2    Minutes  10  10  10  10  10    METs  2.38  2.99  2.99  3.6  3.6       Recumbant Bike    Row Name 11/10/17 1605 11/26/17 0755 12/17/17 1341 12/31/17 1323 01/12/18 1326   Level  1.5  2.5  3  3  3    Minutes  10  10  10  10  10    METs  2  2.4  no documentation  3  3.3       NuStep    Row Name 11/10/17 1605 11/26/17 0755 12/17/17 1341 12/31/17 1323 01/12/18 1326   Level  2  3  4  4  5    SPM  80  80  80  80  85   Minutes  10  10  10  10  10    METs  1.9  2.3  3.1  2.9  3.1       Home Exercise Plan    Row Name 11/10/17 1605 11/26/17 0755 12/17/17 1341 12/31/17 1323 01/12/18 1326   Plans to continue exercise at  no documentation  Home (comment) walking  Home (comment) walking  Home (comment) walking  Home  (comment) walking   Frequency  no documentation  Add 2 additional days to program exercise sessions.  Add 2 additional days to program exercise sessions.  Add 2 additional days to program exercise sessions.  Add 2 additional days to program exercise sessions.   Initial Home Exercises Provided  no documentation  11/17/17  11/17/17  11/17/17  11/17/17          Exercise Comments: Exercise Comments    Row Name 11/14/17 1607 12/17/17 1341 12/31/17 1417 01/12/18 1356   Exercise Comments  Pt completed first week of exercise at cardiac rehab. Pt was able to exercise for 30 minutes without signs/symptoms of physical distress.   Reviewed METs and goals with patient.  Reviewed METs with patient.  Reviewed METs and goals with patient.      Exercise Goals and Review: Exercise Goals    Exercise Goals    Row Name 11/06/17 1412   Increase Physical Activity  Yes   Intervention  Provide advice, education, support and counseling about physical activity/exercise needs.;Develop an individualized exercise prescription for aerobic and resistive training based on initial evaluation findings, risk stratification, comorbidities and participant's personal goals.   Expected Outcomes  Short Term: Attend rehab on a regular basis to increase amount of physical activity.;Long Term: Exercising regularly at least 3-5 days a week.;Long Term: Add in home exercise to make exercise part of routine and to increase amount of physical activity.   Increase Strength and Stamina  Yes improve functional fitness   Intervention  Provide advice, education, support and counseling about physical activity/exercise needs.;Develop an individualized exercise prescription for aerobic and resistive training based on initial evaluation findings, risk stratification, comorbidities and participant's personal goals.   Expected Outcomes  Short Term: Increase workloads from initial exercise prescription for resistance, speed, and METs.;Short Term:  Perform resistance training exercises routinely during rehab and add in resistance training at home;Long Term: Improve cardiorespiratory fitness, muscular endurance and strength as measured by increased METs and functional capacity (6MWT)   Able to understand and use rate of perceived exertion (RPE) scale  Yes   Intervention  Provide education and explanation on how to use RPE scale   Expected Outcomes  Short Term: Able to use RPE daily in rehab to express subjective intensity level;Long Term:  Able to use RPE to guide intensity level when exercising independently   Knowledge and understanding of Target Heart Rate Range (THRR)  Yes   Intervention  Provide education and explanation of THRR including how the numbers were predicted and where they are located for reference   Expected Outcomes  Short Term: Able to state/look up THRR;Short Term: Able to use daily as guideline for intensity in rehab;Long Term: Able to use THRR to govern intensity when exercising independently   Able to check pulse independently  Yes   Intervention  Provide education and demonstration on how to check pulse in carotid and radial arteries.;Review the importance of being able to check your own pulse for safety during independent exercise   Expected Outcomes  Short Term: Able to explain why pulse checking is important during independent exercise;Long Term: Able to check pulse independently and accurately   Understanding of Exercise Prescription  Yes   Intervention  Provide education, explanation, and written materials on patient's individual exercise prescription   Expected Outcomes  Short Term: Able to explain program exercise prescription;Long Term: Able to explain home exercise prescription to exercise independently          Exercise Goals Re-Evaluation : Exercise Goals Re-Evaluation    Exercise Goal Re-Evaluation    Row Name 11/14/17 1608 12/17/17 1341 01/12/18 1356   Exercise Goals Review  Increase Physical  Activity;Able to understand and use rate of perceived exertion (RPE) scale;Increase Strength and Stamina  Increase Physical Activity  Increase Physical Activity   Comments  Pt able to exercise for 30 minutes without difficulty. Pt also has a good understanding of RPE scale and exercise routine in cardiac rehab.  Patient is walking 25-30 minutes 3 days/week. Patient's goal is to walk daily.  Patient states she's walking 30 minutes 4 days/week in addition to exercise at cardiac rehab. Pt successfully added an additional day of home exercise to her routine.   Expected Outcomes  Pt will continue to improve in cardiorespiratory fitness and understand exercise flow/routine in cardiac rehab.  Patient will increase workloads at cardiac rehab to help achieve personal health and fitness goals. Pt will add an additional day to home exercise routine.  Patient will continue daily exercise 30 minutes to help improve aerobic capacity.          Discharge Exercise Prescription (Final Exercise Prescription Changes): Exercise Prescription Changes - 01/12/18 1326    Response to Exercise          Blood Pressure (Admit)  122/78    Blood Pressure (Exercise)  132/70    Blood Pressure (Exit)  122/82    Heart Rate (Admit)  64 bpm    Heart Rate (Exercise)  102 bpm    Heart Rate (Exit)  70 bpm    Rating of Perceived Exertion (Exercise)  13    Symptoms  none    Duration  Continue with 30 min of aerobic exercise without signs/symptoms of  physical distress.    Intensity  THRR unchanged        Progression          Progression  Continue to progress workloads to maintain intensity without signs/symptoms of physical distress.    Average METs  3.3        Resistance Training          Training Prescription  Yes    Weight  3lbs    Reps  10-15    Time  10 Minutes        Interval Training          Interval Training  No        Treadmill          MPH  2.5    Grade  2    Minutes  10    METs  3.6         Recumbant Bike          Level  3    Minutes  10    METs  3.3        NuStep          Level  5    SPM  85    Minutes  10    METs  3.1        Home Exercise Plan          Plans to continue exercise at  Home (comment)   walking   Frequency  Add 2 additional days to program exercise sessions.    Initial Home Exercises Provided  11/17/17           Nutrition:  Target Goals: Understanding of nutrition guidelines, daily intake of sodium 1500mg , cholesterol 200mg , calories 30% from fat and 7% or less from saturated fats, daily to have 5 or more servings of fruits and vegetables.  Biometrics: Pre Biometrics - 11/06/17 1452    Pre Biometrics          Height  5' 2.75" (1.594 m)    Weight  53.3 kg    Waist Circumference  31 inches    Hip Circumference  40.5 inches    Waist to Hip Ratio  0.77 %    BMI (Calculated)  20.98    Triceps Skinfold  23 mm    % Body Fat  33.9 %    Grip Strength  18 kg    Flexibility  13.5 in    Single Leg Stand  8.34 seconds            Nutrition Therapy Plan and Nutrition Goals: Nutrition Therapy & Goals - 11/06/17 0743    Nutrition Therapy          Diet  heart healthy        Personal Nutrition Goals          Nutrition Goal  pt to identify and limit food sources of saturated fat, trans fat, sodium    Personal Goal #2  --    Personal Goal #3  --        Intervention Plan          Intervention  Prescribe, educate and counsel regarding individualized specific dietary modifications aiming towards targeted core components such as weight, hypertension, lipid management, diabetes, heart failure and other comorbidities.    Expected Outcomes  Short Term Goal: Understand basic principles of dietary content, such as calories, fat, sodium, cholesterol and nutrients.           Nutrition Assessments:  Nutrition Assessments - 11/06/17 0756    MEDFICTS Scores          Pre Score  --           Nutrition Goals Re-Evaluation:   Nutrition  Goals Re-Evaluation:   Nutrition Goals Discharge (Final Nutrition Goals Re-Evaluation):   Psychosocial: Target Goals: Acknowledge presence or absence of significant depression and/or stress, maximize coping skills, provide positive support system. Participant is able to verbalize types and ability to use techniques and skills needed for reducing stress and depression.  Initial Review & Psychosocial Screening: Initial Psych Review & Screening - 11/06/17 1534    Initial Review          Current issues with  None Identified;History of Depression        Family Dynamics          Good Support System?  Yes   Rotha has her husband, daughter and sisters for support       Barriers          Psychosocial barriers to participate in program  There are no identifiable barriers or psychosocial needs.        Screening Interventions          Interventions  Encouraged to exercise    Expected Outcomes  Long Term Goal: Stressors or current issues are controlled or eliminated.;Short Term goal: Identification and review with participant of any Quality of Life or Depression concerns found by scoring the questionnaire.           Quality of Life Scores: Quality of Life - 11/06/17 1342    Quality of Life          Select  Quality of Life        Quality of Life Scores          Health/Function Pre  19.25 %    Socioeconomic Pre  22 %    Psych/Spiritual Pre  22.93 %    Family Pre  25.2 %    GLOBAL Pre  21.53 %          Scores of 19 and below usually indicate a poorer quality of life in these areas.  A difference of  2-3 points is a clinically meaningful difference.  A difference of 2-3 points in the total score of the Quality of Life Index has been associated with significant improvement in overall quality of life, self-image, physical symptoms, and general health in studies assessing change in quality of life.  PHQ-9: Recent Review Flowsheet Data    Depression screen [facility] 2/9 11/11/2017    Decreased Interest 1   Down, Depressed, Hopeless 3   PHQ - 2 Score 4   Altered sleeping 2   Tired, decreased energy 2   PHQ-9 Score 8   Difficult doing work/chores Somewhat difficult     Interpretation of Total Score  Total Score Depression Severity:  1-4 = Minimal depression, 5-9 = Mild depression, 10-14 = Moderate depression, 15-19 = Moderately severe depression, 20-27 = Severe depression   Psychosocial Evaluation and Intervention: Psychosocial Evaluation - 11/11/17 0710    Psychosocial Evaluation & Interventions          Interventions  Therapist referral;Stress management education;Relaxation education;Encouraged to exercise with the program and follow exercise prescription    Comments  pt with known depression/anxiety treated with lexapro with multiple stressors. 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 In addition, [place] neighborhood, which has isolated her from  her friends and love of gardening. Her recent cardiac event has increased her anxiety. pt is accepting to schedule [facility] appt.      Expected Outcomes  pt will exhibit improved coping skills and outlook.    Continue Psychosocial Services   Follow up required by staff           Psychosocial Re-Evaluation: Psychosocial Re-Evaluation    Psychosocial Re-Evaluation    [place] Name 11/18/17 1224 12/18/17 1555 01/15/18 0741   Current issues with  None Identified  None Identified  None Identified   Comments  pt with known depression history accepting of offer to seek counseling.  Scheduled appt with Jeanella Craze 11/24/2017.    pt with known depression history accepting of offer to seek counseling.  counseling has been effective. pt demonstrates increased self confidence and decreased health related anxiety. pt able to drive to beach this weekend without difficulty.  this was overcoming a huge emotional barrier.      pt with known depression history accepting of offer to seek  counseling.  counseling has been effective. pt demonstrates increased self confidence and decreased health related anxiety. pt verbalizes improvement in her ovarall well being.       Expected Outcomes   pt will exhibit improved outlook with good coping skills.    pt will exhibit improved outlook with good coping skills.    pt will exhibit improved outlook with good coping skills.    Interventions  Stress management education;Encouraged to attend Cardiac Rehabilitation for the exercise;Relaxation education  Stress management education;Encouraged to attend Cardiac Rehabilitation for the exercise;Relaxation education  Stress management education;Encouraged to attend Cardiac Rehabilitation for the exercise;Relaxation education   Continue Psychosocial Services   no documentation  Follow up required by staff  Follow up required by staff          Psychosocial Discharge (Final Psychosocial Re-Evaluation): Psychosocial Re-Evaluation - 01/15/18 0741    Psychosocial Re-Evaluation          Current issues with  None Identified    Comments  pt with known depression history accepting of offer to seek counseling.  counseling has been effective. pt demonstrates increased self confidence and decreased health related anxiety. pt verbalizes improvement in her ovarall well being.        Expected Outcomes   pt will exhibit improved outlook with good coping skills.     Interventions  Stress management education;Encouraged to attend Cardiac Rehabilitation for the exercise;Relaxation education    Continue Psychosocial Services   Follow up required by staff           Vocational Rehabilitation: Provide vocational rehab assistance to qualifying candidates.   Vocational Rehab Evaluation & Intervention: Vocational Rehab - 11/06/17 1532    Initial Vocational Rehab Evaluation & Intervention          Assessment shows need for Vocational Rehabilitation  No   Dejon is retired and does not need vocational rehab at this  time          Education: Education Goals: Education classes will be provided on a weekly basis, covering required topics. Participant will state understanding/return demonstration of topics presented.  Learning Barriers/Preferences: Learning Barriers/Preferences - 11/06/17 1406    Learning Barriers/Preferences          Learning Barriers  Hearing    Learning Preferences  Skilled Demonstration;Video;Pictoral;Written Material           Education Topics: Count Your Pulse:  -Group instruction provided by verbal instruction, demonstration, patient participation  and written materials to support subject.  Instructors address importance of being able to find your pulse and how to count your pulse when at home without a heart monitor.  Patients get hands on experience counting their pulse with staff help and individually. Flowsheet Row CARDIAC REHAB PHASE II EXERCISE from 12/31/2017 in [place]  Date  11/21/17  Instruction Review Code  2- Demonstrated Understanding      Heart Attack, Angina, and Risk Factor Modification:  -Group instruction provided by verbal instruction, video, and written materials to support subject.  Instructors address signs and symptoms of angina and heart attacks.    Also discuss risk factors for heart disease and how to make changes to improve heart health risk factors.   Functional Fitness:  -Group instruction provided by verbal instruction, demonstration, patient participation, and written materials to support subject.  Instructors address safety measures for doing things around the house.  Discuss how to get up and down off the floor, how to pick things up properly, how to safely get out of a chair without assistance, and balance training.   Meditation and Mindfulness:  -Group instruction provided by verbal instruction, patient participation, and written materials to support subject.  Instructor addresses importance of  mindfulness and meditation practice to help reduce stress and improve awareness.  Instructor also leads participants through a meditation exercise.  Flowsheet Row CARDIAC REHAB PHASE II EXERCISE from 12/31/2017 in [place]  Date  12/10/17  Educator  Jeanella Craze  Instruction Review Code  2- Demonstrated Understanding      Stretching for Flexibility and Mobility:  -Group instruction provided by verbal instruction, patient participation, and written materials to support subject.  Instructors lead participants through series of stretches that are designed to increase flexibility thus improving mobility.  These stretches are additional exercise for major muscle groups that are typically performed during regular warm up and cool down.   Hands Only CPR:  -Group verbal, video, and participation provides a basic overview of AHA guidelines for community CPR. Role-play of emergencies allow participants the opportunity to practice calling for help and chest compression technique with discussion of AED use.   Hypertension: -Group verbal and written instruction that provides a basic overview of hypertension including the most recent diagnostic guidelines, risk factor reduction with self-care instructions and medication management.    Nutrition I class: Heart Healthy Eating:  -Group instruction provided by PowerPoint slides, verbal discussion, and written materials to support subject matter. The instructor gives an explanation and review of the Therapeutic Lifestyle Changes diet recommendations, which includes a discussion on lipid goals, dietary fat, sodium, fiber, plant stanol/sterol esters, sugar, and the components of a well-balanced, healthy diet.   Nutrition II class: Lifestyle Skills:  -Group instruction provided by PowerPoint slides, verbal discussion, and written materials to support subject matter. The instructor gives an explanation and review of label reading,  grocery shopping for heart health, heart healthy recipe modifications, and ways to make healthier choices when eating out.   Diabetes Question & Answer:  -Group instruction provided by PowerPoint slides, verbal discussion, and written materials to support subject matter. The instructor gives an explanation and review of diabetes co-morbidities, pre- and post-prandial blood glucose goals, pre-exercise blood glucose goals, signs, symptoms, and treatment of hypoglycemia and hyperglycemia, and foot care basics.   Diabetes Blitz:  -Group instruction provided by PowerPoint slides, verbal discussion, and written materials to support subject matter. The instructor gives an explanation and  review of the physiology behind type 1 and type 2 diabetes, diabetes medications and rational behind using different medications, pre- and post-prandial blood glucose recommendations and Hemoglobin A1c goals, diabetes diet, and exercise including blood glucose guidelines for exercising safely.    Portion Distortion:  -Group instruction provided by PowerPoint slides, verbal discussion, written materials, and food models to support subject matter. The instructor gives an explanation of serving size versus portion size, changes in portions sizes over the last 20 years, and what consists of a serving from each food group.   Stress Management:  -Group instruction provided by verbal instruction, video, and written materials to support subject matter.  Instructors review role of stress in heart disease and how to cope with stress positively.   Flowsheet Row CARDIAC REHAB PHASE II EXERCISE from 12/31/2017 in [place]  Date  12/31/17  Instruction Review Code  2- Demonstrated Understanding      Exercising on Your Own:  -Group instruction provided by verbal instruction, power point, and written materials to support subject.  Instructors discuss benefits of exercise, components of exercise,  frequency and intensity of exercise, and end points for exercise.  Also discuss use of nitroglycerin and activating EMS.  Review options of places to exercise outside of rehab.  Review guidelines for sex with heart disease. Flowsheet Row CARDIAC REHAB PHASE II EXERCISE from 12/31/2017 in [place]  Date  12/03/17  Educator  EP  Instruction Review Code  2- Demonstrated Understanding      Cardiac Drugs I:  -Group instruction provided by verbal instruction and written materials to support subject.  Instructor reviews cardiac drug classes: antiplatelets, anticoagulants, beta blockers, and statins.  Instructor discusses reasons, side effects, and lifestyle considerations for each drug class.   Cardiac Drugs II:  -Group instruction provided by verbal instruction and written materials to support subject.  Instructor reviews cardiac drug classes: angiotensin converting enzyme inhibitors (ACE-I), angiotensin II receptor blockers (ARBs), nitrates, and calcium channel blockers.  Instructor discusses reasons, side effects, and lifestyle considerations for each drug class.   Anatomy and Physiology of the Circulatory System:  Group verbal and written instruction and models provide basic cardiac anatomy and physiology, with the coronary electrical and arterial systems. Review of: AMI, Angina, Valve disease, Heart Failure, Peripheral Artery Disease, Cardiac Arrhythmia, Pacemakers, and the ICD.   Other Education:  -Group or individual verbal, written, or video instructions that support the educational goals of the cardiac rehab program.   Holiday Eating Survival Tips:  -Group instruction provided by PowerPoint slides, verbal discussion, and written materials to support subject matter. The instructor gives patients tips, tricks, and techniques to help them not only survive but enjoy the holidays despite the onslaught of food that accompanies the holidays.   Knowledge  Questionnaire Score: Knowledge Questionnaire Score - 11/06/17 1342    Knowledge Questionnaire Score          Pre Score  23/24           Core Components/Risk Factors/Patient Goals at Admission: Personal Goals and Risk Factors at Admission - 11/06/17 1453    Core Components/Risk Factors/Patient Goals on Admission           Weight Management  Yes;Weight Maintenance;Weight Loss    Intervention  Weight Management: Develop a combined nutrition and exercise program designed to reach desired caloric intake, while maintaining appropriate intake of nutrient and fiber, sodium and fats, and appropriate energy expenditure required for the weight goal.;Weight  Management: Provide education and appropriate resources to help participant work on and attain dietary goals.    Admit Weight  117 lb 8.1 oz (53.3 kg)    Goal Weight: Short Term  110 lb (49.9 kg)    Goal Weight: Long Term  110 lb (49.9 kg)    Expected Outcomes  Short Term: Continue to assess and modify interventions until short term weight is achieved;Long Term: Adherence to nutrition and physical activity/exercise program aimed toward attainment of established weight goal;Weight Maintenance: Understanding of the daily nutrition guidelines, which includes 25-35% calories from fat, 7% or less cal from saturated fats, less than 200mg  cholesterol, less than 1.5gm of sodium, & 5 or more servings of fruits and vegetables daily;Weight Loss: Understanding of general recommendations for a balanced deficit meal plan, which promotes 1-2 lb weight loss per week and includes a negative energy balance of [phone] kcal/d;Understanding recommendations for meals to include 15-35% energy as protein, 25-35% energy from fat, 35-60% energy from carbohydrates, less than 200mg  of dietary cholesterol, 20-35 gm of total fiber daily;Understanding of distribution of calorie intake throughout the day with the consumption of 4-5 meals/snacks           Core Components/Risk  Factors/Patient Goals Review:  Goals and Risk Factor Review    Core Components/Risk Factors/Patient Goals Review    Row Name 11/06/17 1532 11/11/17 0705 11/18/17 1224 12/18/17 1554 01/15/18 0742   Personal Goals Review  Weight Management/Obesity;Hypertension;Lipids  Weight Management/Obesity;Hypertension;Lipids;Stress  Weight Management/Obesity;Hypertension;Lipids;Stress  Weight Management/Obesity;Hypertension;Lipids;Stress  Weight Management/Obesity;Hypertension;Lipids;Stress   Review  no documentation  pt with multiple CAD RF demonstrates willingness to participate in CR program.pt personal goals are to increase socialization in addition to build strength/stamina.  pt encouraged to engage in all aspects of CR program including peer interaction, education, exercise and nutrition offerings.   pt with multiple CAD RF demonstrates willingness to participate in CR program.pt personal goals are to increase socialization in addition to build strength/stamina.  pt encouraged to engage in all aspects of CR program including peer interaction, education, exercise and nutrition offerings.   pt with multiple CAD RF demonstrates willingness to participate in CR program.pt personal goals are to increase socialization in addition to build strength/stamina.  pt was able to go on beach trip this weekend without difficulty.  pt with multiple CAD RF demonstrates willingness to participate in CR program.pt personal goals are to increase socialization in addition to build strength/stamina.  pt is walking at home.   Expected Outcomes  no documentation  pt will participate in CR exercise, nutrition, and lifestyle modification education opportunites while building community with peers.    pt will participate in CR exercise, nutrition, and lifestyle modification education opportunites while building community with peers.    pt will participate in CR exercise, nutrition, and lifestyle modification education opportunites while building  community with peers.    pt will participate in CR exercise, nutrition, and lifestyle modification education opportunites while building community with peers.            Core Components/Risk Factors/Patient Goals at Discharge (Final Review):  Goals and Risk Factor Review - 01/15/18 0742    Core Components/Risk Factors/Patient Goals Review          Personal Goals Review  Weight Management/Obesity;Hypertension;Lipids;Stress    Review  pt with multiple CAD RF demonstrates willingness to participate in CR program.pt personal goals are to increase socialization in addition to build strength/stamina.  pt is walking at home.    Expected Outcomes  pt  will participate in CR exercise, nutrition, and lifestyle modification education opportunites while building community with peers.             ITP Comments: ITP Comments    Row Name 11/05/17 1226 11/10/17 0703 11/18/17 1213 12/18/17 1554 01/15/18 0740   ITP Comments  Dr. Fransico Him, Medical Director   pt started group exercise program.  pt tolerated light activity without difficulty. pt oriented to exercise equipment and safety routine.   30 day ITP review. pt demonstrates willingness to participate in CR activities.   30 day ITP review. pt demonstrates willingness to participate in CR activities. pt demonstrates increased eagerness to participate in CRprogram.  30 day ITP review. pt demonstrates willingness to participate in CR activities. pt demonstrates increased eagerness to participate in CRprogram.      Comments:

## 2018-01-16 ENCOUNTER — Encounter (HOSPITAL_COMMUNITY): Payer: Medicare Other

## 2018-01-16 ENCOUNTER — Encounter (HOSPITAL_COMMUNITY)
Admission: RE | Admit: 2018-01-16 | Discharge: 2018-01-16 | Disposition: A | Discharge status: Home / Self Care | Payer: Medicare Other | Source: Ambulatory Visit | Attending: Cardiology | Admitting: Cardiology

## 2018-01-16 DIAGNOSIS — Z955 Presence of coronary angioplasty implant and graft: Secondary | ICD-10-CM

## 2018-01-19 ENCOUNTER — Encounter: Payer: Medicare Other | Admitting: Cardiology

## 2018-01-19 ENCOUNTER — Encounter (HOSPITAL_COMMUNITY)
Admission: RE | Admit: 2018-01-19 | Discharge: 2018-01-19 | Disposition: A | Discharge status: Home / Self Care | Payer: Medicare Other | Source: Ambulatory Visit | Attending: Cardiology | Admitting: Cardiology

## 2018-01-19 ENCOUNTER — Encounter (HOSPITAL_COMMUNITY): Payer: Medicare Other

## 2018-01-19 VITALS — Ht 62.75 in

## 2018-01-19 DIAGNOSIS — Z955 Presence of coronary angioplasty implant and graft: Secondary | ICD-10-CM | POA: Diagnosis not present

## 2018-01-20 ENCOUNTER — Encounter: Payer: Self-pay | Admitting: Cardiology

## 2018-01-20 ENCOUNTER — Ambulatory Visit (INDEPENDENT_AMBULATORY_CARE_PROVIDER_SITE_OTHER): Payer: Medicare Other | Admitting: Cardiology

## 2018-01-20 VITALS — BP 110/64 | HR 62 | Ht 62.0 in | Wt 116.6 lb

## 2018-01-20 DIAGNOSIS — I495 Sick sinus syndrome: Secondary | ICD-10-CM

## 2018-01-20 DIAGNOSIS — I471 Supraventricular tachycardia: Secondary | ICD-10-CM

## 2018-01-20 DIAGNOSIS — I2510 Atherosclerotic heart disease of native coronary artery without angina pectoris: Secondary | ICD-10-CM | POA: Diagnosis not present

## 2018-01-20 DIAGNOSIS — Z9861 Coronary angioplasty status: Secondary | ICD-10-CM

## 2018-01-20 NOTE — Patient Instructions (Signed)
Medication Instructions:  Your physician recommends that you continue on your current medications as directed. Please refer to the Current Medication list given to you today.  If you need a refill on your cardiac medications before your next appointment, please call your pharmacy.   Lab work: None ordered  Testing/Procedures: None ordered  Follow-Up: Remote monitoring is used to monitor your Pacemaker from home. This monitoring reduces the number of office visits required to check your device to one time per year. It allows [place] to keep an eye on the functioning of your device to ensure it is working properly. You are scheduled for a device check from home on 04/21/2018. You may send your transmission at any time that day. If you have a wireless device, the transmission will be sent automatically. After your physician reviews your transmission, you will receive a postcard with your next transmission date.   At [facility], you and your health needs are our priority.  As part of our continuing mission to provide you with exceptional heart care, we have created designated Provider Care Teams.  These Care Teams include your primary Cardiologist (physician) and Advanced Practice Providers (APPs -  Physician Assistants and Nurse Practitioners) who all work together to provide you with the care you need, when you need it. You will need a follow up appointment in 9 months.  Please call our office 2 months in advance to schedule this appointment.  You may see Will Meredith Leeds, MD or one of the following Advanced Practice Providers on your designated Care Team:   Chanetta Marshall, NP . Tommye Standard, PA-C  Thank you for choosing [facility]!!   Trinidad Curet, RN [phone]

## 2018-01-20 NOTE — Progress Notes (Signed)
Electrophysiology Office Note   Date:  01/20/2018   ID:  [name], [name] 02-Mar-1941, MRN [id number]  PCP:  [name], MD  Cardiologist:  [place] Primary Electrophysiologist:  [name], MD    No chief complaint on file.    History of Present Illness: Elizabeth Estrada is a 77 y.o. female who is being seen today for the evaluation of complete heart block at the request of [name], MD. Presenting today for electrophysiology evaluation.  She has a history of coronary artery disease, hypertension, complete and complete heart block status post Medtronic dual-chamber pacemaker implanted April 2019.  She had a his lead implanted with a rising threshold.  Fortunately, she is now conducting. She had a lead revision for rising thresholds on 10/13/17.  Today, denies symptoms of palpitations, chest pain, shortness of breath, orthopnea, PND, lower extremity edema, claudication, dizziness, presyncope, syncope, bleeding, or neurologic sequela. The patient is tolerating medications without difficulties.  Overall she is feeling well.  She has no complaints of chest pain or shortness of breath.  Device is functioning appropriately.   Past Medical History:  Diagnosis Date  . 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 Chronic depression   . Coronary artery disease   . Family history of adverse reaction to anesthesia    "dad had PONV"  . Fibromyalgia   . GERD (gastroesophageal reflux disease)   . Heart murmur   . History of kidney stones   . Hypertension    "before stent and pacemaker" (10/13/2017)  . Melanoma ([place])    removed from back  . MVP (mitral valve prolapse)   . Osteoarthritis   . Osteoporosis   . 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 RA (rheumatoid arthritis) ([place])    "all over; mainly hands and knees" (10/13/2017)  . Rib fracture    Past Surgical History:  Procedure Laterality Date  . BREAST BIOPSY Left 11/2003   benign  - Dr. Margot Chimes  . CARDIAC CATHETERIZATION    . CARDIOVASCULAR STRESS TEST  10/19/1999, 2013   EF 97%, NO EVIDENCE OF ISCHEMIA, 2013 NORMAL  . CATARACT EXTRACTION W/ INTRAOCULAR LENS  IMPLANT, BILATERAL Bilateral   . COLONOSCOPY    . CORONARY STENT INTERVENTION N/A 08/08/2017   Procedure: CORONARY STENT INTERVENTION;  Surgeon: [name], MD;  Location: [facility];  Service: Cardiovascular;  Laterality: N/A;  . DILATATION & CURRETTAGE/HYSTEROSCOPY WITH RESECTOCOPE N/A 07/06/2013   Procedure: DILATATION & CURETTAGE/HYSTEROSCOPY WITH RESECTOCOPE with removal of endometrial lesion;  Surgeon: [name], MD;  Location: [facility];  Service: Gynecology;  Laterality: N/A;  . LEAD REVISION/REPAIR N/A 10/13/2017   Procedure: LEAD REVISION/REPAIR;  Surgeon: [name], MD;  Location: [place] [facility];  Service: Cardiovascular;  Laterality: N/A;  . MELANOMA EXCISION  1990s?   from back  . PACEMAKER IMPLANT N/A 08/11/2017   Procedure: PACEMAKER IMPLANT;  Surgeon: [name], MD;  Location: [place] [facility];  Service: Cardiovascular;  Laterality: N/A;  . RIGHT/LEFT HEART CATH AND CORONARY ANGIOGRAPHY N/A 08/08/2017   Procedure: RIGHT/LEFT HEART CATH AND CORONARY ANGIOGRAPHY;  Surgeon: [name], MD;  Location: [place] [facility];  Service: Cardiovascular;  Laterality: N/A;     Current Outpatient Medications  Medication Sig Dispense Refill  . abatacept (ORENCIA) 250 MG injection Inject 250 mg into the vein every 30 (thirty) days.     Marland Kitchen aspirin 81 MG  tablet Take 81 mg by mouth every other day.     Marland Kitchen atorvastatin (LIPITOR) 80 MG tablet Take 1 tablet (80 mg total) by mouth daily at 6 PM. 30 tablet 5  . cetirizine (ZYRTEC) 10 MG tablet Take 10 mg by mouth daily as needed for allergies.    . cholecalciferol (VITAMIN D) 1000 UNITS tablet Take 1,000 Units by mouth daily.    . clopidogrel (PLAVIX) 75 MG tablet Take 1 tablet (75 mg total) by mouth daily with breakfast. 30  tablet 11  . escitalopram (LEXAPRO) 10 MG tablet Take 10 mg by mouth at bedtime.     . fluticasone (FLONASE) 50 MCG/ACT nasal spray Place 1-2 sprays into both nostrils daily as needed for allergies or rhinitis.    . Polyvinyl Alcohol-Povidone (REFRESH OP) Place 1-2 drops into both eyes daily as needed (for dry eyes).     No current facility-administered medications for this visit.     Allergies:   Methotrexate derivatives; Plaquenil [hydroxychloroquine sulfate]; Remicade [infliximab]; Ridaura [auranofin]; and Sudafed [pseudoephedrine]   Social History:  The patient  reports that she has never smoked. She has never used smokeless tobacco. She reports that she does not drink alcohol or use drugs.   Family History:  The patient's family history includes CAD (age of onset: 74) in her brother; Coronary artery disease in her mother; Heart disease in her father; Hypertension in her father; Pancreatic cancer in her father.    ROS:  Please see the history of present illness.   Otherwise, review of systems is positive for depression, easy brusising, SOB.   All other systems are reviewed and negative.   PHYSICAL EXAM: VS:  BP 110/64   Pulse 62   Ht 5\' 2"  (1.575 m)   Wt 116 lb 9.6 oz (52.9 kg)   LMP 04/15/1990   SpO2 97%   BMI 21.33 kg/m  , BMI Body mass index is 21.33 kg/m. GEN: Well nourished, well developed, in no acute distress  HEENT: normal  Neck: no JVD, carotid bruits, or masses Cardiac: RRR; no murmurs, rubs, or gallops,no edema  Respiratory:  clear to auscultation bilaterally, normal work of breathing GI: soft, nontender, nondistended, + BS MS: no deformity or atrophy  Skin: warm and dry, device site well healed Neuro:  Strength and sensation are intact Psych: euthymic mood, full affect  EKG:  EKG is ordered today. Personal review of the ekg ordered shows anus rhythm, junctional escape  Personal review of the device interrogation today. Results in [place]: 07/14/2017: TSH 1.610 08/11/2017: Magnesium 2.0 08/13/2017: ALT 15 09/30/2017: BUN 15; Creatinine, Ser 0.70; Hemoglobin 13.1; NT-Pro BNP 417; Platelets 161; Potassium 4.2; Sodium 142    Lipid Panel     Component Value Date/Time   CHOL 227 (H) 07/14/2017 1214   TRIG 43 07/14/2017 1214   HDL 91 07/14/2017 1214   CHOLHDL 2.5 07/14/2017 1214   LDLCALC 127 (H) 07/14/2017 1214     Wt Readings from Last 3 Encounters:  01/20/18 116 lb 9.6 oz (52.9 kg)  12/30/17 118 lb (53.5 kg)  11/18/17 117 lb 6.4 oz (53.3 kg)      Other studies Reviewed: Additional studies/ records that were reviewed today include: TTE 12/05/17 Review of the above records today demonstrates:  - Left ventricle: The cavity size was normal. Systolic function was   vigorous. The estimated ejection fraction was in the range of 65%   to 70%. Wall motion was normal; there were no  regional wall   motion abnormalities. Doppler parameters are consistent with   abnormal left ventricular relaxation (grade 1 diastolic   dysfunction). Doppler parameters are consistent with elevated   ventricular end-diastolic filling pressure. - Mitral valve: Moderately thickened, moderately calcified leaflets   . The findings are consistent with moderate stenosis. There was   mild regurgitation. Mean gradient (D): 7 mm Hg. Valve area by   continuity equation (using LVOT flow): 1.17 cm^2. - Left atrium: The atrium was mildly dilated. - Right ventricle: Systolic function was normal. - Right atrium: The atrium was mildly dilated. - Tricuspid valve: There was mild regurgitation. - Pericardium, extracardiac: A mild to moderate circumferential   pericardial effusion was identified. Features were not consistent   with tamponade physiology.   LHC/RHC 08/08/17  80% ostial RCA.  The RCA is dominant.  30 to 40% proximal and mid LAD.  70% proximal diagonal #1.  Widely patent circumflex with tortuous obtuse marginals.  Normal left  main  Calcified mitral valve and annulus.  No significant mitral stenosis is noted.  Mild pulmonary hypertension  Normal left ventricular systolic function with elevated end-diastolic and pulmonary wedge pressure in the 20 to 22 mmHg range consistent with diastolic heart failure, chronic.  Successful PCI and stent of the ostial RCA from 80% with TIMI grade III flow to less than 10% with TIMI grade III flow using a 3.0 x 12 mm Synergy postdilated to 3.5 mmHg pressure.   ASSESSMENT AND PLAN:  1.  Complete heart block: Status post Medtronic dual-chamber pacemaker implanted 08/11/2017.  She had a lead revision due to increasing thresholds on 10/13/2017.  Device functioning appropriately.  No changes.    2.  Coronary artery disease: Status post drug-eluting stent to the RCA 07/2017.  Currently on aspirin and Plavix.  3.  Atrial tachycardia: None seen on recent device interrogation  4.  Sinus bradycardia: Status post Medtronic pacemaker    Current medicines are reviewed at length with the patient today.   The patient does not have concerns regarding her medicines.  The following changes were made today: None  Labs/ tests ordered today include:  Orders Placed This Encounter  Procedures  . EKG 12-Lead     Disposition:   FU with Will Camnitz 9 months  Signed, Will Meredith Leeds, MD  01/20/2018 11:44 AM     [facility] [address] [place] [address] [phone] (office) [phone] (fax)

## 2018-01-21 ENCOUNTER — Encounter (HOSPITAL_COMMUNITY)
Admission: RE | Admit: 2018-01-21 | Discharge: 2018-01-21 | Disposition: A | Discharge status: Home / Self Care | Payer: Medicare Other | Source: Ambulatory Visit | Attending: Cardiology | Admitting: Cardiology

## 2018-01-21 ENCOUNTER — Encounter (HOSPITAL_COMMUNITY): Payer: Medicare Other

## 2018-01-21 DIAGNOSIS — Z955 Presence of coronary angioplasty implant and graft: Secondary | ICD-10-CM | POA: Diagnosis not present

## 2018-01-23 ENCOUNTER — Encounter (HOSPITAL_COMMUNITY)
Admission: RE | Admit: 2018-01-23 | Discharge: 2018-01-23 | Disposition: A | Discharge status: Home / Self Care | Payer: Medicare Other | Source: Ambulatory Visit | Attending: Cardiology | Admitting: Cardiology

## 2018-01-23 ENCOUNTER — Encounter (HOSPITAL_COMMUNITY): Payer: Medicare Other

## 2018-01-23 DIAGNOSIS — Z955 Presence of coronary angioplasty implant and graft: Secondary | ICD-10-CM | POA: Diagnosis not present

## 2018-01-26 ENCOUNTER — Encounter (HOSPITAL_COMMUNITY): Payer: Medicare Other

## 2018-01-26 ENCOUNTER — Encounter (HOSPITAL_COMMUNITY)
Admission: RE | Admit: 2018-01-26 | Discharge: 2018-01-26 | Disposition: A | Discharge status: Home / Self Care | Payer: Medicare Other | Source: Ambulatory Visit | Attending: Cardiology | Admitting: Cardiology

## 2018-01-26 DIAGNOSIS — Z955 Presence of coronary angioplasty implant and graft: Secondary | ICD-10-CM

## 2018-01-28 ENCOUNTER — Encounter (HOSPITAL_COMMUNITY)
Admission: RE | Admit: 2018-01-28 | Discharge: 2018-01-28 | Disposition: A | Discharge status: Home / Self Care | Payer: Medicare Other | Source: Ambulatory Visit | Attending: Cardiology | Admitting: Cardiology

## 2018-01-28 ENCOUNTER — Encounter (HOSPITAL_COMMUNITY): Payer: Self-pay

## 2018-01-28 ENCOUNTER — Encounter (HOSPITAL_COMMUNITY): Payer: Medicare Other

## 2018-01-28 DIAGNOSIS — Z955 Presence of coronary angioplasty implant and graft: Secondary | ICD-10-CM

## 2018-01-30 ENCOUNTER — Encounter (HOSPITAL_COMMUNITY): Payer: Medicare Other

## 2018-01-30 ENCOUNTER — Encounter (HOSPITAL_COMMUNITY)
Admission: RE | Admit: 2018-01-30 | Discharge: 2018-01-30 | Disposition: A | Discharge status: Home / Self Care | Payer: Medicare Other | Source: Ambulatory Visit | Attending: Cardiology | Admitting: Cardiology

## 2018-01-30 DIAGNOSIS — Z955 Presence of coronary angioplasty implant and graft: Secondary | ICD-10-CM

## 2018-02-02 ENCOUNTER — Encounter (HOSPITAL_COMMUNITY): Payer: Medicare Other

## 2018-02-03 ENCOUNTER — Other Ambulatory Visit: Payer: Self-pay | Admitting: Cardiology

## 2018-02-03 DIAGNOSIS — M0579 Rheumatoid arthritis with rheumatoid factor of multiple sites without organ or systems involvement: Secondary | ICD-10-CM | POA: Diagnosis not present

## 2018-02-04 ENCOUNTER — Encounter (HOSPITAL_COMMUNITY): Payer: Medicare Other

## 2018-02-06 ENCOUNTER — Encounter (HOSPITAL_COMMUNITY): Payer: Medicare Other

## 2018-02-09 ENCOUNTER — Encounter (HOSPITAL_COMMUNITY): Payer: Medicare Other

## 2018-02-11 ENCOUNTER — Encounter (HOSPITAL_COMMUNITY): Payer: Medicare Other

## 2018-02-17 NOTE — Addendum Note (Signed)
Encounter addended by: Lowell Guitar, RN on: 02/17/2018 3:23 PM  Actions taken: Sign clinical note

## 2018-02-17 NOTE — Progress Notes (Signed)
Discharge Progress Report  Patient Details  Name: Elizabeth Estrada MRN: [id number] Date of Birth: 12/26/1940 Referring Provider:   Flowsheet Row CARDIAC REHAB PHASE II ORIENTATION from 11/06/2017 in [place]  Referring Provider  Martinique, Peter MD       Number of Visits: 36   Reason for Discharge:  Patient has met program and personal goals.  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, S/P DES RCA  ADL [facility]:   Initial Exercise Prescription: Initial Exercise Prescription - 11/06/17 1300    Date of Initial Exercise RX and Referring Provider          Date  11/06/17    Referring Provider  Martinique, Peter MD        Treadmill          MPH  1.8    Grade  0    Minutes  10    METs  2.38        Recumbant Bike          Level  1.5    Minutes  10    METs  1.8        NuStep          Level  2    SPM  80    Minutes  10    METs  1.8        Prescription Details          Frequency (times per week)  3    Duration  Progress to 30 minutes of continuous aerobic without signs/symptoms of physical distress        Intensity          THRR 40-80% of Max Heartrate  58-115    Ratings of Perceived Exertion  11-13    Perceived Dyspnea  0-4        Progression          Progression  Continue to progress workloads to maintain intensity without signs/symptoms of physical distress.        Resistance Training          Training Prescription  Yes    Weight  2lbs    Reps  10-15           Discharge Exercise Prescription (Final Exercise Prescription Changes): Exercise Prescription Changes - 01/26/18 1118    Response to Exercise          Blood Pressure (Admit)  118/82    Blood Pressure (Exercise)  130/80    Blood Pressure (Exit)  112/72    Heart Rate (Admit)  77 bpm    Heart Rate (Exercise)  96 bpm    Heart Rate (Exit)  73  bpm    Rating of Perceived Exertion (Exercise)  12    Symptoms  none    Duration  Continue with 30 min of aerobic exercise without signs/symptoms of physical distress.    Intensity  THRR unchanged        Progression          Progression  Continue to progress workloads to maintain intensity without signs/symptoms of physical distress.    Average METs  3.3        Resistance Training          Training Prescription  Yes    Weight  3lbs    Reps  10-15  Time  10 Minutes        Interval Training          Interval Training  No        Treadmill          MPH  2.7    Grade  2    Minutes  10    METs  3.81        Recumbant Bike          Level  3    Minutes  10    METs  2.8        NuStep          Level  5    SPM  85    Minutes  10    METs  3.1        Home Exercise Plan          Plans to continue exercise at  Home (comment)   walking   Frequency  Add 2 additional days to program exercise sessions.    Initial Home Exercises Provided  11/17/17           Functional Capacity: 6 Minute Walk    6 Minute Walk    Row Name 11/06/17 1411 11/06/17 1435 11/06/17 1452   Phase  no documentation  Initial  no documentation   Distance  no documentation  1278 feet  no documentation   Walk Time  no documentation  6 minutes  no documentation   # of Rest Breaks  no documentation  0  no documentation   MPH  no documentation  2.4  no documentation   METS  no documentation  2.6  no documentation   RPE  no documentation  11  no documentation   VO2 Peak  no documentation  9.1  no documentation   Symptoms  no documentation  No  no documentation   Resting HR  no documentation  78 bpm  no documentation   Resting BP  no documentation  118/72  no documentation   Resting Oxygen Saturation   no documentation  99 %  no documentation   Exercise Oxygen Saturation  during 6 min walk  no documentation  100 %  no documentation   Max Ex. HR  no documentation  86 bpm  no documentation   Max  Ex. BP  no documentation  124/76  no documentation   2 Minute Post BP  no documentation  no documentation  102/60       Interval Oxygen    Row Name 11/06/17 1411 11/06/17 1435 11/06/17 1452   Interval Oxygen?  Yes  no documentation  no documentation       6 Minute Walk    Row Name 01/19/18 1347   Phase  Discharge   Distance  1600 feet   Distance % Change  25.2 %   Walk Time  6 minutes   # of Rest Breaks  0   MPH  3.03   METS  3.26   RPE  9   Perceived Dyspnea   0   VO2 Peak  11.42   Symptoms  No   Resting HR  71 bpm   Resting BP  122/70   Max Ex. HR  92 bpm   Max Ex. BP  130/78   2 Minute Post BP  98/60          Psychological, QOL, Others - Outcomes: PHQ 2/9: Depression screen [facility] 2/9 01/28/2018 11/11/2017  Decreased Interest  0 1  Down, Depressed, Hopeless 1 3  PHQ - 2 Score 1 4  Altered sleeping - 2  Tired, decreased energy - 2  PHQ-9 Score - 8  Difficult doing work/chores - Somewhat difficult    Quality of Life: Quality of Life - 02/09/18 1139    Quality of Life          Select  Quality of Life        Quality of Life Scores          Health/Function Post  22.71 %    Socioeconomic Post  25.2 %    Psych/Spiritual Post  20.57 %    Family Post  24.6 %    GLOBAL Post  22.94 %           Personal Goals: Goals established at orientation with interventions provided to work toward goal. Personal Goals and Risk Factors at Admission - 11/06/17 1453    Core Components/Risk Factors/Patient Goals on Admission           Weight Management  Yes;Weight Maintenance;Weight Loss    Intervention  Weight Management: Develop a combined nutrition and exercise program designed to reach desired caloric intake, while maintaining appropriate intake of nutrient and fiber, sodium and fats, and appropriate energy expenditure required for the weight goal.;Weight Management: Provide education and appropriate resources to help participant work on and attain dietary goals.    Admit  Weight  117 lb 8.1 oz (53.3 kg)    Goal Weight: Short Term  110 lb (49.9 kg)    Goal Weight: Long Term  110 lb (49.9 kg)    Expected Outcomes  Short Term: Continue to assess and modify interventions until short term weight is achieved;Long Term: Adherence to nutrition and physical activity/exercise program aimed toward attainment of established weight goal;Weight Maintenance: Understanding of the daily nutrition guidelines, which includes 25-35% calories from fat, 7% or less cal from saturated fats, less than 264m cholesterol, less than 1.5gm of sodium, & 5 or more servings of fruits and vegetables daily;Weight Loss: Understanding of general recommendations for a balanced deficit meal plan, which promotes 1-2 lb weight loss per week and includes a negative energy balance of [phone] kcal/d;Understanding recommendations for meals to include 15-35% energy as protein, 25-35% energy from fat, 35-60% energy from carbohydrates, less than 2086mof dietary cholesterol, 20-35 gm of total fiber daily;Understanding of distribution of calorie intake throughout the day with the consumption of 4-5 meals/snacks            Personal Goals Discharge: Goals and Risk Factor Review    Core Components/Risk Factors/Patient Goals Review    Row Name 11/06/17 1532 11/11/17 0705 11/18/17 1224 12/18/17 1554 01/15/18 0742   Personal Goals Review  Weight Management/Obesity;Hypertension;Lipids  Weight Management/Obesity;Hypertension;Lipids;Stress  Weight Management/Obesity;Hypertension;Lipids;Stress  Weight Management/Obesity;Hypertension;Lipids;Stress  Weight Management/Obesity;Hypertension;Lipids;Stress   Review  no documentation  pt with multiple CAD RF demonstrates willingness to participate in CR program.pt personal goals are to increase socialization in addition to build strength/stamina.  pt encouraged to engage in all aspects of CR program including peer interaction, education, exercise and nutrition offerings.   pt with  multiple CAD RF demonstrates willingness to participate in CR program.pt personal goals are to increase socialization in addition to build strength/stamina.  pt encouraged to engage in all aspects of CR program including peer interaction, education, exercise and nutrition offerings.   pt with multiple CAD RF demonstrates willingness to participate in CR program.pt personal goals are to increase socialization in addition  to build strength/stamina.  pt was able to go on beach trip this weekend without difficulty.  pt with multiple CAD RF demonstrates willingness to participate in CR program.pt personal goals are to increase socialization in addition to build strength/stamina.  pt is walking at home.   Expected Outcomes  no documentation  pt will participate in CR exercise, nutrition, and lifestyle modification education opportunites while building community with peers.    pt will participate in CR exercise, nutrition, and lifestyle modification education opportunites while building community with peers.    pt will participate in CR exercise, nutrition, and lifestyle modification education opportunites while building community with peers.    pt will participate in CR exercise, nutrition, and lifestyle modification education opportunites while building community with peers.         Core Components/Risk Factors/Patient Goals Review    Row Name 01/28/18 1418   Personal Goals Review  Weight Management/Obesity;Hypertension;Lipids;Stress   Review  pt with multiple CAD RF demonstrates willingness to participate in CR program.pt personal goals are to increase socialization in addition to build strength/stamina.  pt exhibits increased self esteem and self confidence. pt has enjoyed the socialzation at CR. pt plans to continue walking on her own at home. pt eager to participate in housekeeping projects.     Expected Outcomes  pt will participate in exercise, nutrition, and lifestyle modification opportunites on her own  in the community.             Exercise Goals and Review: Exercise Goals    Exercise Goals    Row Name 11/06/17 1412   Increase Physical Activity  Yes   Intervention  Provide advice, education, support and counseling about physical activity/exercise needs.;Develop an individualized exercise prescription for aerobic and resistive training based on initial evaluation findings, risk stratification, comorbidities and participant's personal goals.   Expected Outcomes  Short Term: Attend rehab on a regular basis to increase amount of physical activity.;Long Term: Exercising regularly at least 3-5 days a week.;Long Term: Add in home exercise to make exercise part of routine and to increase amount of physical activity.   Increase Strength and Stamina  Yes improve functional fitness   Intervention  Provide advice, education, support and counseling about physical activity/exercise needs.;Develop an individualized exercise prescription for aerobic and resistive training based on initial evaluation findings, risk stratification, comorbidities and participant's personal goals.   Expected Outcomes  Short Term: Increase workloads from initial exercise prescription for resistance, speed, and METs.;Short Term: Perform resistance training exercises routinely during rehab and add in resistance training at home;Long Term: Improve cardiorespiratory fitness, muscular endurance and strength as measured by increased METs and functional capacity (6MWT)   Able to understand and use rate of perceived exertion (RPE) scale  Yes   Intervention  Provide education and explanation on how to use RPE scale   Expected Outcomes  Short Term: Able to use RPE daily in rehab to express subjective intensity level;Long Term:  Able to use RPE to guide intensity level when exercising independently   Knowledge and understanding of Target Heart Rate Range (THRR)  Yes   Intervention  Provide education and explanation of THRR including how the  numbers were predicted and where they are located for reference   Expected Outcomes  Short Term: Able to state/look up THRR;Short Term: Able to use daily as guideline for intensity in rehab;Long Term: Able to use THRR to govern intensity when exercising independently   Able to check pulse independently  Yes  Intervention  Provide education and demonstration on how to check pulse in carotid and radial arteries.;Review the importance of being able to check your own pulse for safety during independent exercise   Expected Outcomes  Short Term: Able to explain why pulse checking is important during independent exercise;Long Term: Able to check pulse independently and accurately   Understanding of Exercise Prescription  Yes   Intervention  Provide education, explanation, and written materials on patient's individual exercise prescription   Expected Outcomes  Short Term: Able to explain program exercise prescription;Long Term: Able to explain home exercise prescription to exercise independently          Nutrition & Weight - Outcomes: Pre Biometrics - 11/06/17 1452    Pre Biometrics          Height  5' 2.75" (1.594 m)    Weight  53.3 kg    Waist Circumference  31 inches    Hip Circumference  40.5 inches    Waist to Hip Ratio  0.77 %    BMI (Calculated)  20.98    Triceps Skinfold  23 mm    % Body Fat  33.9 %    Grip Strength  18 kg    Flexibility  13.5 in    Single Leg Stand  8.34 seconds          Post Biometrics - 02/09/18 1149     Post  Biometrics          % Body Fat  31 %           Nutrition: Nutrition Therapy & Goals - 02/13/18 0810    Nutrition Therapy          Diet  heart healthy        Personal Nutrition Goals          Nutrition Goal  pt to identify and limit food sources of saturated fat, trans fat, sodium        Intervention Plan          Intervention  Prescribe, educate and counsel regarding individualized specific dietary modifications aiming towards targeted  core components such as weight, hypertension, lipid management, diabetes, heart failure and other comorbidities.    Expected Outcomes  Short Term Goal: Understand basic principles of dietary content, such as calories, fat, sodium, cholesterol and nutrients.           Nutrition Discharge: Nutrition Assessments - 02/12/18 1703    MEDFICTS Scores          Pre Score  34    Post Score  21    Score Difference  -13           Education Questionnaire Score: Knowledge Questionnaire Score - 02/09/18 1146    Knowledge Questionnaire Score          Post Score  22/24           Goals reviewed with patient; copy given to patient.

## 2018-02-17 NOTE — Addendum Note (Signed)
Encounter addended by: Lowell Guitar, RN on: 02/17/2018 4:04 PM  Actions taken: Episode resolved

## 2018-03-03 DIAGNOSIS — M0579 Rheumatoid arthritis with rheumatoid factor of multiple sites without organ or systems involvement: Secondary | ICD-10-CM | POA: Diagnosis not present

## 2018-03-10 DIAGNOSIS — H40023 Open angle with borderline findings, high risk, bilateral: Secondary | ICD-10-CM | POA: Diagnosis not present

## 2018-03-10 DIAGNOSIS — H11153 Pinguecula, bilateral: Secondary | ICD-10-CM | POA: Diagnosis not present

## 2018-03-10 DIAGNOSIS — H40053 Ocular hypertension, bilateral: Secondary | ICD-10-CM | POA: Diagnosis not present

## 2018-03-10 DIAGNOSIS — Z961 Presence of intraocular lens: Secondary | ICD-10-CM | POA: Diagnosis not present

## 2018-03-10 DIAGNOSIS — Z9849 Cataract extraction status, unspecified eye: Secondary | ICD-10-CM | POA: Diagnosis not present

## 2018-03-10 DIAGNOSIS — H18413 Arcus senilis, bilateral: Secondary | ICD-10-CM | POA: Diagnosis not present

## 2018-03-10 DIAGNOSIS — H0102A Squamous blepharitis right eye, upper and lower eyelids: Secondary | ICD-10-CM | POA: Diagnosis not present

## 2018-03-10 DIAGNOSIS — H04123 Dry eye syndrome of bilateral lacrimal glands: Secondary | ICD-10-CM | POA: Diagnosis not present

## 2018-03-10 DIAGNOSIS — H40003 Preglaucoma, unspecified, bilateral: Secondary | ICD-10-CM | POA: Diagnosis not present

## 2018-03-10 DIAGNOSIS — H0102B Squamous blepharitis left eye, upper and lower eyelids: Secondary | ICD-10-CM | POA: Diagnosis not present

## 2018-03-26 DIAGNOSIS — M5136 Other intervertebral disc degeneration, lumbar region: Secondary | ICD-10-CM | POA: Diagnosis not present

## 2018-03-26 DIAGNOSIS — M25461 Effusion, right knee: Secondary | ICD-10-CM | POA: Diagnosis not present

## 2018-03-26 DIAGNOSIS — Z6820 Body mass index (BMI) 20.0-20.9, adult: Secondary | ICD-10-CM | POA: Diagnosis not present

## 2018-03-26 DIAGNOSIS — M150 Primary generalized (osteo)arthritis: Secondary | ICD-10-CM | POA: Diagnosis not present

## 2018-03-26 DIAGNOSIS — M810 Age-related osteoporosis without current pathological fracture: Secondary | ICD-10-CM | POA: Diagnosis not present

## 2018-03-26 DIAGNOSIS — M0579 Rheumatoid arthritis with rheumatoid factor of multiple sites without organ or systems involvement: Secondary | ICD-10-CM | POA: Diagnosis not present

## 2018-03-30 DIAGNOSIS — R1032 Left lower quadrant pain: Secondary | ICD-10-CM | POA: Diagnosis not present

## 2018-03-30 DIAGNOSIS — N200 Calculus of kidney: Secondary | ICD-10-CM | POA: Diagnosis not present

## 2018-03-30 DIAGNOSIS — N393 Stress incontinence (female) (male): Secondary | ICD-10-CM | POA: Diagnosis not present

## 2018-03-31 DIAGNOSIS — M0579 Rheumatoid arthritis with rheumatoid factor of multiple sites without organ or systems involvement: Secondary | ICD-10-CM | POA: Diagnosis not present

## 2018-04-21 ENCOUNTER — Ambulatory Visit (INDEPENDENT_AMBULATORY_CARE_PROVIDER_SITE_OTHER): Payer: Medicare Other

## 2018-04-21 DIAGNOSIS — I442 Atrioventricular block, complete: Secondary | ICD-10-CM

## 2018-04-22 LAB — CUP PACEART REMOTE DEVICE CHECK
Battery Remaining Longevity: 130 mo
Battery Voltage: 3.03 V
Brady Statistic AP VP Percent: 25.01 %
Brady Statistic AP VS Percent: 0.02 %
Brady Statistic AS VP Percent: 74.89 %
Brady Statistic AS VS Percent: 0.07 %
Brady Statistic RA Percent Paced: 24.98 %
Brady Statistic RV Percent Paced: 99.9 %
Date Time Interrogation Session: 20200107070856
Implantable Lead Implant Date: 20190429
Implantable Lead Implant Date: 20190429
Implantable Lead Location: 753859
Implantable Lead Location: 753860
Implantable Lead Model: 5076
Implantable Lead Model: 5076
Implantable Pulse Generator Implant Date: 20190429
Lead Channel Impedance Value: 342 Ohm
Lead Channel Impedance Value: 361 Ohm
Lead Channel Impedance Value: 494 Ohm
Lead Channel Impedance Value: 513 Ohm
Lead Channel Pacing Threshold Amplitude: 0.625 V
Lead Channel Pacing Threshold Amplitude: 0.625 V
Lead Channel Pacing Threshold Pulse Width: 0.4 ms
Lead Channel Pacing Threshold Pulse Width: 0.4 ms
Lead Channel Sensing Intrinsic Amplitude: 2.5 mV
Lead Channel Sensing Intrinsic Amplitude: 2.5 mV
Lead Channel Sensing Intrinsic Amplitude: 23.875 mV
Lead Channel Sensing Intrinsic Amplitude: 23.875 mV
Lead Channel Setting Pacing Amplitude: 1.5 V
Lead Channel Setting Pacing Amplitude: 2.5 V
Lead Channel Setting Pacing Pulse Width: 0.4 ms
Lead Channel Setting Sensing Sensitivity: 1.2 mV

## 2018-04-22 NOTE — Progress Notes (Signed)
Remote pacemaker transmission.   

## 2018-04-29 DIAGNOSIS — M0579 Rheumatoid arthritis with rheumatoid factor of multiple sites without organ or systems involvement: Secondary | ICD-10-CM | POA: Diagnosis not present

## 2018-05-27 DIAGNOSIS — M0579 Rheumatoid arthritis with rheumatoid factor of multiple sites without organ or systems involvement: Secondary | ICD-10-CM | POA: Diagnosis not present

## 2018-06-17 DIAGNOSIS — L243 Irritant contact dermatitis due to cosmetics: Secondary | ICD-10-CM | POA: Diagnosis not present

## 2018-06-17 DIAGNOSIS — D692 Other nonthrombocytopenic purpura: Secondary | ICD-10-CM | POA: Diagnosis not present

## 2018-06-24 DIAGNOSIS — M0579 Rheumatoid arthritis with rheumatoid factor of multiple sites without organ or systems involvement: Secondary | ICD-10-CM | POA: Diagnosis not present

## 2018-07-03 ENCOUNTER — Telehealth: Payer: Self-pay

## 2018-07-03 NOTE — Telephone Encounter (Signed)
   Primary Cardiologist:  Peter Martinique, MD   Patient contacted.  History reviewed.  No symptoms to suggest any unstable cardiac conditions.  Based on discussion, with current pandemic situation, we will be postponing this 07/08/18 appointment for Elizabeth Estrada.  If symptoms change, she has been instructed to contact our office.   Appointment rescheduled to 09/21/18 at 1:40 pm.  [name], LPN  0/87/1994 1:29 PM         .

## 2018-07-06 ENCOUNTER — Ambulatory Visit: Payer: Medicare Other | Admitting: Cardiology

## 2018-07-08 ENCOUNTER — Ambulatory Visit: Payer: Medicare Other | Admitting: Cardiology

## 2018-07-21 ENCOUNTER — Ambulatory Visit (INDEPENDENT_AMBULATORY_CARE_PROVIDER_SITE_OTHER): Payer: Medicare Other | Admitting: *Deleted

## 2018-07-21 ENCOUNTER — Other Ambulatory Visit: Payer: Self-pay

## 2018-07-21 DIAGNOSIS — I442 Atrioventricular block, complete: Secondary | ICD-10-CM | POA: Diagnosis not present

## 2018-07-21 DIAGNOSIS — I495 Sick sinus syndrome: Secondary | ICD-10-CM

## 2018-07-21 LAB — CUP PACEART REMOTE DEVICE CHECK
Battery Remaining Longevity: 123 mo
Battery Voltage: 3.02 V
Brady Statistic AP VP Percent: 19.71 %
Brady Statistic AP VS Percent: 0.05 %
Brady Statistic AS VP Percent: 80.13 %
Brady Statistic AS VS Percent: 0.11 %
Brady Statistic RA Percent Paced: 19.62 %
Brady Statistic RV Percent Paced: 99.84 %
Date Time Interrogation Session: 20200407035929
Implantable Lead Implant Date: 20190429
Implantable Lead Implant Date: 20190429
Implantable Lead Location: 753859
Implantable Lead Location: 753860
Implantable Lead Model: 5076
Implantable Lead Model: 5076
Implantable Pulse Generator Implant Date: 20190429
Lead Channel Impedance Value: 323 Ohm
Lead Channel Impedance Value: 361 Ohm
Lead Channel Impedance Value: 418 Ohm
Lead Channel Impedance Value: 513 Ohm
Lead Channel Pacing Threshold Amplitude: 0.75 V
Lead Channel Pacing Threshold Amplitude: 0.75 V
Lead Channel Pacing Threshold Pulse Width: 0.4 ms
Lead Channel Pacing Threshold Pulse Width: 0.4 ms
Lead Channel Sensing Intrinsic Amplitude: 20.75 mV
Lead Channel Sensing Intrinsic Amplitude: 20.75 mV
Lead Channel Sensing Intrinsic Amplitude: 3.125 mV
Lead Channel Sensing Intrinsic Amplitude: 3.125 mV
Lead Channel Setting Pacing Amplitude: 1.5 V
Lead Channel Setting Pacing Amplitude: 2.5 V
Lead Channel Setting Pacing Pulse Width: 0.4 ms
Lead Channel Setting Sensing Sensitivity: 1.2 mV

## 2018-07-22 DIAGNOSIS — M0579 Rheumatoid arthritis with rheumatoid factor of multiple sites without organ or systems involvement: Secondary | ICD-10-CM | POA: Diagnosis not present

## 2018-07-29 ENCOUNTER — Encounter: Payer: Self-pay | Admitting: Cardiology

## 2018-07-29 NOTE — Progress Notes (Signed)
Remote pacemaker transmission.   

## 2018-08-02 ENCOUNTER — Other Ambulatory Visit: Payer: Self-pay | Admitting: Cardiology

## 2018-08-03 NOTE — Telephone Encounter (Signed)
Clopidogrel refilled

## 2018-08-19 DIAGNOSIS — M0579 Rheumatoid arthritis with rheumatoid factor of multiple sites without organ or systems involvement: Secondary | ICD-10-CM | POA: Diagnosis not present

## 2018-09-16 ENCOUNTER — Telehealth: Payer: Self-pay | Admitting: Cardiology

## 2018-09-16 DIAGNOSIS — M0579 Rheumatoid arthritis with rheumatoid factor of multiple sites without organ or systems involvement: Secondary | ICD-10-CM | POA: Diagnosis not present

## 2018-09-16 NOTE — Telephone Encounter (Signed)
Mychart, smartphone, consent, pre reg complete 09/16/18 AF

## 2018-09-17 NOTE — Progress Notes (Signed)
Virtual Visit via Video Note   This visit type was conducted due to national recommendations for restrictions regarding the COVID-19 Pandemic (e.g. social distancing) in an effort to limit this patient's exposure and mitigate transmission in our community.  Due to her co-morbid illnesses, this patient is at least at moderate risk for complications without adequate follow up.  This format is felt to be most appropriate for this patient at this time.  All issues noted in this document were discussed and addressed.  A limited physical exam was performed with this format.  Please refer to the patient's chart for her consent to telehealth for [facility].   Date:  09/21/2018   ID:  Elizabeth Estrada, DOB March 03, 1941, MRN [id number]  Patient Location: Home Provider Location: Office  PCP:  [name], MD  Cardiologist:   [place], MD  Electrophysiologist:  [name], MD   Evaluation Performed:  Follow-Up Visit  Chief Complaint:  Follow up CAD  History of Present Illness:    Elizabeth Estrada is a 78 y.o. female with history of CAD and complete heart block. She has a history of MVP and family history of CAD. She has a history of murmur. Echo in 2006 mentions mild prolapse but in 2013 she had an Echo showing a small pericardial effusion otherwise normal. No prolapse seen. She also had a normal stress Echo at that time.   She was seen in January 2019 with some palpitations. Seen again in April with symptoms of exertional chest pressure. Echo showed mild to moderate mitral stenosis. ETT was done and demonstrated very poor exercise tolerance with hypertensive BP response. There was 2:1 AV block noted. An event monitor showed episodes of complete heart block. She underwent right and left heart cath. This showed an 80% ostial RCA stenosis and 70% diagonal. There was mild pulmonary HTN without significant mitral stenosis. EDP was elevated c/w diastolic dysfunction. She underwent  stenting of the ostial RCA with DES. She was placed on DAPT. Post stent she had persistent episodes of CHB despite correcting ischemia and underwent DDD pacemaker placement on 08/11/17.  Seen in the device clinic on 5/14. RV threshold was elevated. CXR showed no change in lead position.  She was seen by [name] in August 2019. Had persistent SOB. Repeat Echo showed no change. 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   She had a normal pacemaker check remotely on 07/21/18.   On our visit today she just notes that she is down in the dumps with the pandemic. She has not been getting any exercise. He appetite has been poor and she has lost weight. No nausea. Denies any chest pain, dyspnea, palpitations, dizziness, or edema  The patient does not have symptoms concerning for COVID-19 infection (fever, chills, cough, or new shortness of breath).    Past Medical History:  Diagnosis Date  . 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 Chronic depression   . Coronary artery disease   . Family history of adverse reaction to anesthesia    "dad had PONV"  . Fibromyalgia   . GERD (gastroesophageal reflux disease)   . Heart murmur   . History of kidney stones   . Hypertension    "before stent and pacemaker" (10/13/2017)  . Melanoma ([place])    removed from back  . MVP (mitral valve prolapse)   . Osteoarthritis   . Osteoporosis   . Presence of permanent  cardiac pacemaker 08/08/2017  . RA (rheumatoid arthritis) ([place])    "all over; mainly hands and knees" (10/13/2017)  . Rib fracture    Past Surgical History:  Procedure Laterality Date  . BREAST BIOPSY Left 11/2003   benign - Dr. Margot Chimes  . CARDIAC CATHETERIZATION    . CARDIOVASCULAR STRESS TEST  10/19/1999, 2013   EF 97%, NO EVIDENCE OF ISCHEMIA, 2013 NORMAL  . CATARACT EXTRACTION W/ INTRAOCULAR LENS  IMPLANT, BILATERAL Bilateral   . COLONOSCOPY    . CORONARY STENT INTERVENTION N/A 08/08/2017   Procedure: CORONARY  STENT INTERVENTION;  Surgeon: [name], MD;  Location: [place] [facility];  Service: Cardiovascular;  Laterality: N/A;  . DILATATION & CURRETTAGE/HYSTEROSCOPY WITH RESECTOCOPE N/A 07/06/2013   Procedure: DILATATION & CURETTAGE/HYSTEROSCOPY WITH RESECTOCOPE with removal of endometrial lesion;  Surgeon: [name], MD;  Location: [facility];  Service: Gynecology;  Laterality: N/A;  . LEAD REVISION/REPAIR N/A 10/13/2017   Procedure: LEAD REVISION/REPAIR;  Surgeon: [name], MD;  Location: [place] [facility];  Service: Cardiovascular;  Laterality: N/A;  . MELANOMA EXCISION  1990s?   from back  . PACEMAKER IMPLANT N/A 08/11/2017   Procedure: PACEMAKER IMPLANT;  Surgeon: [name], MD;  Location: [place] [facility];  Service: Cardiovascular;  Laterality: N/A;  . RIGHT/LEFT HEART CATH AND CORONARY ANGIOGRAPHY N/A 08/08/2017   Procedure: RIGHT/LEFT HEART CATH AND CORONARY ANGIOGRAPHY;  Surgeon: [name], MD;  Location: [place] [facility];  Service: Cardiovascular;  Laterality: N/A;     Current Meds  Medication Sig  . abatacept (ORENCIA) 250 MG injection Inject 250 mg into the vein every 30 (thirty) days.   Marland Kitchen aspirin 81 MG tablet Take 81 mg by mouth daily.  Marland Kitchen atorvastatin (LIPITOR) 80 MG tablet TAKE 1 TABLET(80 MG) BY MOUTH DAILY AT 6 PM  . cetirizine (ZYRTEC) 10 MG tablet Take 10 mg by mouth daily as needed for allergies.  . cholecalciferol (VITAMIN D) 1000 UNITS tablet Take 1,000 Units by mouth daily.  Marland Kitchen escitalopram (LEXAPRO) 20 MG tablet Take 20 mg by mouth at bedtime.   . fluticasone (FLONASE) 50 MCG/ACT nasal spray Place 1-2 sprays into both nostrils daily as needed for allergies or rhinitis.  . Polyvinyl Alcohol-Povidone (REFRESH OP) Place 1-2 drops into both eyes daily as needed (for dry eyes).  . [DISCONTINUED] clopidogrel (PLAVIX) 75 MG tablet TAKE 1 TABLET(75 MG) BY MOUTH DAILY WITH BREAKFAST     Allergies:   Methotrexate derivatives; Plaquenil  [hydroxychloroquine sulfate]; Remicade [infliximab]; Ridaura [auranofin]; and Sudafed [pseudoephedrine]   Social History   Tobacco Use  . Smoking status: Never Smoker  . Smokeless tobacco: Never Used  Substance Use Topics  . Alcohol use: Never    Frequency: Never  . Drug use: Never     Family Hx: The patient's family history includes CAD (age of onset: 79) in her brother; Coronary artery disease in her mother; Heart disease in her father; Hypertension in her father; Pancreatic cancer in her father.  ROS:   Please see the history of present illness.    All other systems reviewed and are negative.   Prior CV studies:   The following studies were reviewed today:  Event monitor 07/24/17: Study Highlights    Normal sinus rhythm  Periods of intermittent complete heart block and Type 2 second degree AV block with 2:1 conduction   Echo 07/31/17: Study Conclusions  - Left ventricle: The cavity size was normal. Wall thickness was normal. Systolic  function was normal. The estimated ejection fraction was in the range of 60% to 65%. Wall motion was normal; there were no regional wall motion abnormalities. Doppler parameters are consistent with abnormal left ventricular relaxation (grade 1 diastolic dysfunction). - Aortic valve: There was no stenosis. - Mitral valve: The findings are consistent with moderate stenosis. There was no significant regurgitation. Mean gradient (D): 8 mm Hg. Valve area by pressure half-time: 1.49 cm^2. - Left atrium: The atrium was moderately dilated. - Right ventricle: The cavity size was normal. Systolic function was normal. - Tricuspid valve: Peak RV-RA gradient (S): 26 mm Hg. - Pulmonary arteries: PA peak pressure: 29 mm Hg (S). - Inferior vena cava: The vessel was normal in size. The respirophasic diameter changes were in the normal range (>= 50%), consistent with normal central venous pressure. - Pericardium, extracardiac:  Small to moderate pericardial effusion. <25% respirophasic variation of mitral E inflow velocity. IVC is not dilated. No significant RV diastolic collapse. No pericardial tamponade.  Impressions:  - Normal LV size with EF 60-65%. Normal RV size and systolic function. 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 There was a small to moderate pericardial effusion without tamponade.  ETT 08/05/17: Study Highlights     Blood pressure demonstrated a hypertensive response to exercise.  There was no ST segment deviation noted during stress.  Abnormal ETT with poor exercise tolerance (1:01); pt complained of chest discomfort during the study; hypertensive BP response; pt developed 2:1 AV block with exertion (that correlated with chest pain) that resolved in recovery; no ST changes. Pt reviewed with Dr Curt Bears prior to DC.    Cardiac cath/PCI: 08/08/17: Conclusion    80% ostial RCA. The RCA is dominant.  30 to 40% proximal and mid LAD. 70% proximal diagonal #1.  Widely patent circumflex with tortuous obtuse marginals.  Normal left main  Calcified mitral valve and annulus. No significant mitral stenosis is noted.  Mild pulmonary hypertension  Normal left ventricular systolic function with elevated end-diastolic and pulmonary wedge pressure in the 20 to 22 mmHg range consistent with diastolic heart failure, chronic.  Successful PCI and stent of the ostial RCA from 80% with TIMI grade III flow to less than 10% with TIMI grade III flow using a 3.0 x 12 mm Synergy postdilated to 3.5 mmHg pressure.  RECOMMENDATIONS:   Aspirin and Plavix for at least 6 months.  Pacemaker may be needed if continued prolonged episodes of second-degree heart block with bradycardia.  Aggressive risk factor modification.  Probable discharge in a.m. if no complications.      Pacemaker implant 08/11/17: CONCLUSIONS:  1. Successful  implantation of a Medtronic Azure XT DR MRI SureScan dual-chamber pacemaker for symptomatic bradycardia 2. No early apparent complications.   Echo 12/05/17: Study Conclusions  - Left ventricle: The cavity size was normal. Systolic function was vigorous. The estimated ejection fraction was in the range of 65% to 70%. Wall motion was normal; there were no regional wall motion abnormalities. Doppler parameters are consistent with abnormal left ventricular relaxation (grade 1 diastolic dysfunction). Doppler parameters are consistent with elevated ventricular end-diastolic filling pressure. - Mitral valve: Moderately thickened, moderately calcified leaflets . The findings are consistent with moderate stenosis. There was mild regurgitation. Mean gradient (D): 7 mm Hg. Valve area by continuity equation (using LVOT flow): 1.17 cm^2. - Left atrium: The atrium was mildly dilated. - Right ventricle: Systolic function was normal. - Right atrium: The atrium was mildly dilated. - Tricuspid valve: There was mild  regurgitation. - Pericardium, extracardiac: A mild to moderate circumferential pericardial effusion was identified. Features were not consistent with tamponade physiology.  Impressions:  - No significant change since the prior study on 07/31/2017.    Labs/Other Tests and Data Reviewed:    EKG:  No ECG reviewed.  Recent Labs: 09/30/2017: BUN 15; Creatinine, Ser 0.70; Hemoglobin 13.1; NT-Pro BNP 417; Platelets 161; Potassium 4.2; Sodium 142   Recent Lipid Panel Lab Results  Component Value Date/Time   CHOL 227 (H) 07/14/2017 12:14 PM   TRIG 43 07/14/2017 12:14 PM   HDL 91 07/14/2017 12:14 PM   CHOLHDL 2.5 07/14/2017 12:14 PM   LDLCALC 127 (H) 07/14/2017 12:14 PM   Dated 01/07/17: cholesterol 147, triglycerides 37, HDL 75, LDL 65. CMET and CBC normal.  Wt Readings from Last 3 Encounters:  09/21/18 112 lb (50.8 kg)  01/20/18 116 lb 9.6 oz (52.9 kg)   12/30/17 118 lb (53.5 kg)     Objective:    Vital Signs:  BP 126/67   Pulse 71   Ht 5\' 2"  (1.575 m)   Wt 112 lb (50.8 kg)   LMP 04/15/1990   BMI 20.49 kg/m    VITAL SIGNS:  reviewed  General: no distress HEENT normal Respirations unlabored. Neuro alert and oriented x 3.  Mood normal  ASSESSMENT & PLAN:     1. CAD. S/p DES of ostial RCA on 08/08/17. Recommend stopping Plavix at this time.  Continue statin. Encouraged more aerobic exercise. Will treat residual disease in a diagonal branch medically.  2. CHB s/p DDD pacemaker insertion on 08/11/17. Follow up with EP 3. Mild-moderate  Mitral stenosis on Echo. Right heart cath without significant stenosis/vavle gradient 4. Diastolic dysfunction- asymptomatic.  5. RA. Follow up with  Dr. Amil Amen. On [place] 6. HLD will check fasting lab work  COVID-19 Education: The signs and symptoms of COVID-19 were discussed with the patient and how to seek care for testing (follow up with PCP or arrange E-visit).  The importance of social distancing was discussed today.  Time:   Today, I have spent 15 minutes with the patient with telehealth technology discussing the above problems.     Medication Adjustments/Labs and Tests Ordered: Current medicines are reviewed at length with the patient today.  Concerns regarding medicines are outlined above.   Tests Ordered: No orders of the defined types were placed in this encounter.   Medication Changes: No orders of the defined types were placed in this encounter.   Disposition:  Follow up in 1 year(s)  Signed,  [place], MD  09/21/2018 2:22 PM    [place]

## 2018-09-21 ENCOUNTER — Encounter: Payer: Self-pay | Admitting: Cardiology

## 2018-09-21 ENCOUNTER — Telehealth (INDEPENDENT_AMBULATORY_CARE_PROVIDER_SITE_OTHER): Payer: Medicare Other | Admitting: Cardiology

## 2018-09-21 VITALS — BP 126/67 | HR 71 | Ht 62.0 in | Wt 112.0 lb

## 2018-09-21 DIAGNOSIS — I050 Rheumatic mitral stenosis: Secondary | ICD-10-CM

## 2018-09-21 DIAGNOSIS — Z950 Presence of cardiac pacemaker: Secondary | ICD-10-CM

## 2018-09-21 DIAGNOSIS — I442 Atrioventricular block, complete: Secondary | ICD-10-CM

## 2018-09-21 DIAGNOSIS — Z9861 Coronary angioplasty status: Secondary | ICD-10-CM

## 2018-09-21 DIAGNOSIS — I2510 Atherosclerotic heart disease of native coronary artery without angina pectoris: Secondary | ICD-10-CM | POA: Diagnosis not present

## 2018-09-21 NOTE — Patient Instructions (Signed)
Medication Instructions:  Stop taking Plavix  Continue all other medications If you need a refill on your cardiac medications before your next appointment, please call your pharmacy.   Lab work: Fasting lab ( bmet,lipid and hepatic panels,cbc ) Lab open 8:00 am to 12:00 noon No appointment needed  Nothing to eat or drink after midnight   If you have labs (blood work) drawn today and your tests are completely normal, you will receive your results only by: Marland Kitchen MyChart Message (if you have MyChart) OR . A paper copy in the mail If you have any lab test that is abnormal or we need to change your treatment, we will call you to review the results.  Testing/Procedures: None ordered  Follow-Up: At [facility], you and your health needs are our priority.  As part of our continuing mission to provide you with exceptional heart care, we have created designated Provider Care Teams.  These Care Teams include your primary Cardiologist (physician) and Advanced Practice Providers (APPs -  Physician Assistants and Nurse Practitioners) who all work together to provide you with the care you need, when you need it. . Schedule follow up appointment in 1 year    Call 3 months before to schedule

## 2018-09-23 DIAGNOSIS — I050 Rheumatic mitral stenosis: Secondary | ICD-10-CM | POA: Diagnosis not present

## 2018-09-23 DIAGNOSIS — Z9861 Coronary angioplasty status: Secondary | ICD-10-CM | POA: Diagnosis not present

## 2018-09-23 DIAGNOSIS — I2510 Atherosclerotic heart disease of native coronary artery without angina pectoris: Secondary | ICD-10-CM | POA: Diagnosis not present

## 2018-09-23 DIAGNOSIS — Z950 Presence of cardiac pacemaker: Secondary | ICD-10-CM | POA: Diagnosis not present

## 2018-09-23 DIAGNOSIS — I442 Atrioventricular block, complete: Secondary | ICD-10-CM | POA: Diagnosis not present

## 2018-09-24 LAB — CBC WITH DIFFERENTIAL/PLATELET
Basophils Absolute: 0 10*3/uL (ref 0.0–0.2)
Basos: 0 %
EOS (ABSOLUTE): 0.2 10*3/uL (ref 0.0–0.4)
Eos: 2 %
Hematocrit: 41.1 % (ref 34.0–46.6)
Hemoglobin: 13.2 g/dL (ref 11.1–15.9)
Immature Grans (Abs): 0 10*3/uL (ref 0.0–0.1)
Immature Granulocytes: 0 %
Lymphocytes Absolute: 1.9 10*3/uL (ref 0.7–3.1)
Lymphs: 22 %
MCH: 28.4 pg (ref 26.6–33.0)
MCHC: 32.1 g/dL (ref 31.5–35.7)
MCV: 88 fL (ref 79–97)
Monocytes Absolute: 0.5 10*3/uL (ref 0.1–0.9)
Monocytes: 6 %
Neutrophils Absolute: 6.2 10*3/uL (ref 1.4–7.0)
Neutrophils: 70 %
Platelets: 165 10*3/uL (ref 150–450)
RBC: 4.65 x10E6/uL (ref 3.77–5.28)
RDW: 13.7 % (ref 11.7–15.4)
WBC: 8.8 10*3/uL (ref 3.4–10.8)

## 2018-09-24 LAB — HEPATIC FUNCTION PANEL
ALT: 24 IU/L (ref 0–32)
AST: 29 IU/L (ref 0–40)
Albumin: 4.2 g/dL (ref 3.7–4.7)
Alkaline Phosphatase: 87 IU/L (ref 39–117)
Bilirubin Total: 1 mg/dL (ref 0.0–1.2)
Bilirubin, Direct: 0.28 mg/dL (ref 0.00–0.40)
Total Protein: 6.5 g/dL (ref 6.0–8.5)

## 2018-09-24 LAB — BASIC METABOLIC PANEL
BUN/Creatinine Ratio: 27 (ref 12–28)
BUN: 20 mg/dL (ref 8–27)
CO2: 22 mmol/L (ref 20–29)
Calcium: 8.9 mg/dL (ref 8.7–10.3)
Chloride: 105 mmol/L (ref 96–106)
Creatinine, Ser: 0.75 mg/dL (ref 0.57–1.00)
GFR calc Af Amer: 89 mL/min/{1.73_m2} (ref >59)
GFR calc non Af Amer: 77 mL/min/{1.73_m2} (ref >59)
Glucose: 73 mg/dL (ref 65–99)
Potassium: 4.3 mmol/L (ref 3.5–5.2)
Sodium: 143 mmol/L (ref 134–144)

## 2018-09-24 LAB — LIPID PANEL
Chol/HDL Ratio: 2 ratio (ref 0.0–4.4)
Cholesterol, Total: 147 mg/dL (ref 100–199)
HDL: 73 mg/dL (ref >39)
LDL Calculated: 65 mg/dL (ref 0–99)
Triglycerides: 44 mg/dL (ref 0–149)
VLDL Cholesterol Cal: 9 mg/dL (ref 5–40)

## 2018-09-29 DIAGNOSIS — H0102A Squamous blepharitis right eye, upper and lower eyelids: Secondary | ICD-10-CM | POA: Diagnosis not present

## 2018-09-29 DIAGNOSIS — H18413 Arcus senilis, bilateral: Secondary | ICD-10-CM | POA: Diagnosis not present

## 2018-09-29 DIAGNOSIS — H11153 Pinguecula, bilateral: Secondary | ICD-10-CM | POA: Diagnosis not present

## 2018-09-29 DIAGNOSIS — H40053 Ocular hypertension, bilateral: Secondary | ICD-10-CM | POA: Diagnosis not present

## 2018-09-29 DIAGNOSIS — H04123 Dry eye syndrome of bilateral lacrimal glands: Secondary | ICD-10-CM | POA: Diagnosis not present

## 2018-09-29 DIAGNOSIS — H0102B Squamous blepharitis left eye, upper and lower eyelids: Secondary | ICD-10-CM | POA: Diagnosis not present

## 2018-09-29 DIAGNOSIS — H40023 Open angle with borderline findings, high risk, bilateral: Secondary | ICD-10-CM | POA: Diagnosis not present

## 2018-10-05 DIAGNOSIS — M810 Age-related osteoporosis without current pathological fracture: Secondary | ICD-10-CM | POA: Diagnosis not present

## 2018-10-05 DIAGNOSIS — M0579 Rheumatoid arthritis with rheumatoid factor of multiple sites without organ or systems involvement: Secondary | ICD-10-CM | POA: Diagnosis not present

## 2018-10-05 DIAGNOSIS — M150 Primary generalized (osteo)arthritis: Secondary | ICD-10-CM | POA: Diagnosis not present

## 2018-10-05 DIAGNOSIS — Z681 Body mass index (BMI) 19 or less, adult: Secondary | ICD-10-CM | POA: Diagnosis not present

## 2018-10-05 DIAGNOSIS — M5136 Other intervertebral disc degeneration, lumbar region: Secondary | ICD-10-CM | POA: Diagnosis not present

## 2018-10-05 DIAGNOSIS — M25461 Effusion, right knee: Secondary | ICD-10-CM | POA: Diagnosis not present

## 2018-10-14 DIAGNOSIS — M0579 Rheumatoid arthritis with rheumatoid factor of multiple sites without organ or systems involvement: Secondary | ICD-10-CM | POA: Diagnosis not present

## 2018-10-20 ENCOUNTER — Ambulatory Visit (INDEPENDENT_AMBULATORY_CARE_PROVIDER_SITE_OTHER): Payer: Medicare Other | Admitting: *Deleted

## 2018-10-20 DIAGNOSIS — I495 Sick sinus syndrome: Secondary | ICD-10-CM | POA: Diagnosis not present

## 2018-10-21 ENCOUNTER — Telehealth: Payer: Self-pay | Admitting: Cardiology

## 2018-10-21 LAB — CUP PACEART REMOTE DEVICE CHECK
Battery Remaining Longevity: 118 mo
Battery Voltage: 3.01 V
Brady Statistic AP VP Percent: 31.39 %
Brady Statistic AP VS Percent: 0.4 %
Brady Statistic AS VP Percent: 67.42 %
Brady Statistic AS VS Percent: 0.79 %
Brady Statistic RA Percent Paced: 31.98 %
Brady Statistic RV Percent Paced: 98.81 %
Date Time Interrogation Session: 20200707084137
Implantable Lead Implant Date: 20190429
Implantable Lead Implant Date: 20190429
Implantable Lead Location: 753859
Implantable Lead Location: 753860
Implantable Lead Model: 5076
Implantable Lead Model: 5076
Implantable Pulse Generator Implant Date: 20190429
Lead Channel Impedance Value: 323 Ohm
Lead Channel Impedance Value: 342 Ohm
Lead Channel Impedance Value: 399 Ohm
Lead Channel Impedance Value: 513 Ohm
Lead Channel Pacing Threshold Amplitude: 0.5 V
Lead Channel Pacing Threshold Amplitude: 0.875 V
Lead Channel Pacing Threshold Pulse Width: 0.4 ms
Lead Channel Pacing Threshold Pulse Width: 0.4 ms
Lead Channel Sensing Intrinsic Amplitude: 20 mV
Lead Channel Sensing Intrinsic Amplitude: 20 mV
Lead Channel Sensing Intrinsic Amplitude: 3.125 mV
Lead Channel Sensing Intrinsic Amplitude: 3.125 mV
Lead Channel Setting Pacing Amplitude: 1.75 V
Lead Channel Setting Pacing Amplitude: 2.5 V
Lead Channel Setting Pacing Pulse Width: 0.4 ms
Lead Channel Setting Sensing Sensitivity: 1.2 mV

## 2018-10-21 NOTE — Telephone Encounter (Signed)
New message    Spoke with pt about RS appt on 07.17.20 to 07.13.20 with Dr. Curt Bears. Pt has a smart phone but said she is not tech savy and would prefer a phone call. Pt phone number is listed in appt notes.       Virtual Visit Pre-Appointment Phone Call  "(Name), I am calling you today to discuss your upcoming appointment. We are currently trying to limit exposure to the virus that causes COVID-19 by seeing patients at home rather than in the office."  1. "What is the BEST phone number to call the day of the visit?" - include this in appointment notes  2. Do you have or have access to (through a family member/friend) a smartphone with video capability that we can use for your visit?" a. If yes - list this number in appt notes as cell (if different from BEST phone #) and list the appointment type as a VIDEO visit in appointment notes b. If no - list the appointment type as a PHONE visit in appointment notes  3. Confirm consent - "In the setting of the current Covid19 crisis, you are scheduled for a (phone or video) visit with your provider on (date) at (time).  Just as we do with many in-office visits, in order for you to participate in this visit, we must obtain consent.  If you'd like, I can send this to your mychart (if signed up) or email for you to review.  Otherwise, I can obtain your verbal consent now.  All virtual visits are billed to your insurance company just like a normal visit would be.  By agreeing to a virtual visit, we'd like you to understand that the technology does not allow for your provider to perform an examination, and thus may limit your provider's ability to fully assess your condition. If your provider identifies any concerns that need to be evaluated in person, we will make arrangements to do so.  Finally, though the technology is pretty good, we cannot assure that it will always work on either your or our end, and in the setting of a video visit, we may have to  convert it to a phone-only visit.  In either situation, we cannot ensure that we have a secure connection.  Are you willing to proceed?" STAFF: Did the patient verbally acknowledge consent to telehealth visit? Document YES/NO here: YES  4. Advise patient to be prepared - "Two hours prior to your appointment, go ahead and check your blood pressure, pulse, oxygen saturation, and your weight (if you have the equipment to check those) and write them all down. When your visit starts, your provider will ask you for this information. If you have an Apple Watch or Kardia device, please plan to have heart rate information ready on the day of your appointment. Please have a pen and paper handy nearby the day of the visit as well."  5. Give patient instructions for MyChart download to smartphone OR [email or web address] as below if video visit (depending on what platform provider is using)  6. Inform patient they will receive a phone call 15 minutes prior to their appointment time (may be from unknown caller ID) so they should be prepared to answer    TELEPHONE CALL NOTE  RUCHEL Estrada has been deemed a candidate for a follow-up tele-health visit to limit community exposure during the Covid-19 pandemic. I spoke with the patient via phone to ensure availability of phone/video source, confirm preferred email & phone number,  and discuss instructions and expectations.  I reminded Elizabeth Estrada to be prepared with any vital sign and/or heart rhythm information that could potentially be obtained via home monitoring, at the time of her visit. I reminded NITISHA Estrada to expect a phone call prior to her visit.  Elizabeth Estrada 10/21/2018 10:02 AM   INSTRUCTIONS FOR DOWNLOADING THE MYCHART APP TO SMARTPHONE  - The patient must first make sure to have activated MyChart and know their login information - If Apple, go to [employer] and type in MyChart in the search bar and download the app. If Android, ask  patient to go to [employer] and type in [place] in the search bar and download the app. The app is free but as with any other app downloads, their phone may require them to verify saved payment information or Apple/Android password.  - The patient will need to then log into the app with their MyChart username and password, and select [facility] as their healthcare provider to link the account. When it is time for your visit, go to the MyChart app, find appointments, and click Begin Video Visit. Be sure to Select Allow for your device to access the Microphone and Camera for your visit. You will then be connected, and your provider will be with you shortly.  **If they have any issues connecting, or need assistance please contact MyChart service desk [phone])**  **If using a computer, in order to ensure the best quality for their visit they will need to use either of the following Internet Browsers: [employer], or Google Chrome**  IF USING DOXIMITY or DOXY.ME - The patient will receive a link just prior to their visit by text.     FULL LENGTH CONSENT FOR TELE-HEALTH VISIT   I hereby voluntarily request, consent and authorize [facility] and its employed or contracted physicians, physician assistants, nurse practitioners or other licensed health care professionals (the Practitioner), to provide me with telemedicine health care services (the Services") as deemed necessary by the treating Practitioner. I acknowledge and consent to receive the Services by the Practitioner via telemedicine. I understand that the telemedicine visit will involve communicating with the Practitioner through live audiovisual communication technology and the disclosure of certain medical information by electronic transmission. I acknowledge that I have been given the opportunity to request an in-person assessment or other available alternative prior to the telemedicine visit and am voluntarily  participating in the telemedicine visit.  I understand that I have the right to withhold or withdraw my consent to the use of telemedicine in the course of my care at any time, without affecting my right to future care or treatment, and that the Practitioner or I may terminate the telemedicine visit at any time. I understand that I have the right to inspect all information obtained and/or recorded in the course of the telemedicine visit and may receive copies of available information for a reasonable fee.  I understand that some of the potential risks of receiving the Services via telemedicine include:   Delay or interruption in medical evaluation due to technological equipment failure or disruption;  Information transmitted may not be sufficient (e.g. poor resolution of images) to allow for appropriate medical decision making by the Practitioner; and/or   In rare instances, security protocols could fail, causing a breach of personal health information.  Furthermore, I acknowledge that it is my responsibility to provide information about my medical history, conditions and care that is complete  and accurate to the best of my ability. I acknowledge that Practitioner's advice, recommendations, and/or decision may be based on factors not within their control, such as incomplete or inaccurate data provided by me or distortions of diagnostic images or specimens that may result from electronic transmissions. I understand that the practice of medicine is not an exact science and that Practitioner makes no warranties or guarantees regarding treatment outcomes. I acknowledge that I will receive a copy of this consent concurrently upon execution via email to the email address I last provided but may also request a printed copy by calling the office of [facility].    I understand that my insurance will be billed for this visit.   I have read or had this consent read to me.  I understand the contents of this  consent, which adequately explains the benefits and risks of the Services being provided via telemedicine.   I have been provided ample opportunity to ask questions regarding this consent and the Services and have had my questions answered to my satisfaction.  I give my informed consent for the services to be provided through the use of telemedicine in my medical care  By participating in this telemedicine visit I agree to the above.

## 2018-10-26 ENCOUNTER — Telehealth (INDEPENDENT_AMBULATORY_CARE_PROVIDER_SITE_OTHER): Payer: Medicare Other | Admitting: Cardiology

## 2018-10-26 DIAGNOSIS — Z950 Presence of cardiac pacemaker: Secondary | ICD-10-CM

## 2018-10-26 NOTE — Progress Notes (Signed)
Electrophysiology TeleHealth Note   Due to national recommendations of social distancing due to COVID 19, an audio/video telehealth visit is felt to be most appropriate for this patient at this time.  See Epic message for the patient's consent to telehealth for [facility].   Date:  10/26/2018   ID:  Elizabeth Estrada, DOB [birth year], MRN [id number]  Location: patient's home  Provider location: [address], [place]  Evaluation Performed: Follow-up visit  PCP:  [name], MD  Cardiologist:  Peter Martinique, MD  Electrophysiologist:  Dr Curt Bears  Chief Complaint:  pacemaker  History of Present Illness:    Elizabeth Estrada is a 78 y.o. female who presents via audio/video conferencing for a telehealth visit today.  Since last being seen in our clinic, the patient reports doing very well.  Today, she denies symptoms of palpitations, chest pain, shortness of breath,  lower extremity edema, dizziness, presyncope, or syncope.  The patient is otherwise without complaint today.  The patient denies symptoms of fevers, chills, cough, or new SOB worrisome for COVID 19.  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  She had a lead revision for increasing his lead thresholds on 701 and 19.  Today, denies symptoms of palpitations, chest pain, shortness of breath, orthopnea, PND, lower extremity edema, claudication, dizziness, presyncope, syncope, bleeding, or neurologic sequela. The patient is tolerating medications without difficulties.    Past Medical History:  Diagnosis Date  . 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 Chronic depression   . Coronary artery disease   . Family history of adverse reaction to anesthesia    "dad had PONV"  . Fibromyalgia   . GERD (gastroesophageal reflux disease)   . Heart murmur   . History of  kidney stones   . Hypertension    "before stent and pacemaker" (10/13/2017)  . Melanoma ([place])    removed from back  . MVP (mitral valve prolapse)   . Osteoarthritis   . Osteoporosis   . 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 RA (rheumatoid arthritis) ([place])    "all over; mainly hands and knees" (10/13/2017)  . Rib fracture     Past Surgical History:  Procedure Laterality Date  . BREAST BIOPSY Left 11/2003   benign - Dr. Margot Chimes  . CARDIAC CATHETERIZATION    . CARDIOVASCULAR STRESS TEST  10/19/1999, 2013   EF 97%, NO EVIDENCE OF ISCHEMIA, 2013 NORMAL  . CATARACT EXTRACTION W/ INTRAOCULAR LENS  IMPLANT, BILATERAL Bilateral   . COLONOSCOPY    . CORONARY STENT INTERVENTION N/A 08/08/2017   Procedure: CORONARY STENT INTERVENTION;  Surgeon: [name], MD;  Location: [facility];  Service: Cardiovascular;  Laterality: N/A;  . DILATATION & CURRETTAGE/HYSTEROSCOPY WITH RESECTOCOPE N/A 07/06/2013   Procedure: DILATATION & CURETTAGE/HYSTEROSCOPY WITH RESECTOCOPE with removal of endometrial lesion;  Surgeon: [name], MD;  Location: [facility];  Service: Gynecology;  Laterality: N/A;  . LEAD REVISION/REPAIR N/A 10/13/2017   Procedure: LEAD REVISION/REPAIR;  Surgeon: [name], MD;  Location: [facility];  Service: Cardiovascular;  Laterality: N/A;  . MELANOMA EXCISION  1990s?   from back  . PACEMAKER IMPLANT N/A 08/11/2017   Procedure: PACEMAKER IMPLANT;  Surgeon: [name], MD;  Location: [facility];  Service: Cardiovascular;  Laterality: N/A;  . RIGHT/LEFT  HEART CATH AND CORONARY ANGIOGRAPHY N/A 08/08/2017   Procedure: RIGHT/LEFT HEART CATH AND CORONARY ANGIOGRAPHY;  Surgeon: [name], MD;  Location: [facility];  Service: Cardiovascular;  Laterality: N/A;    Current Outpatient Medications  Medication Sig Dispense Refill  . abatacept (ORENCIA) 250 MG injection Inject 250 mg into the vein every 30 (thirty) days.     Marland Kitchen  aspirin 81 MG tablet Take 81 mg by mouth daily.    Marland Kitchen atorvastatin (LIPITOR) 80 MG tablet TAKE 1 TABLET(80 MG) BY MOUTH DAILY AT 6 PM 30 tablet 11  . cetirizine (ZYRTEC) 10 MG tablet Take 10 mg by mouth daily as needed for allergies.    . cholecalciferol (VITAMIN D) 1000 UNITS tablet Take 1,000 Units by mouth daily.    Marland Kitchen escitalopram (LEXAPRO) 20 MG tablet Take 20 mg by mouth at bedtime.     . fluticasone (FLONASE) 50 MCG/ACT nasal spray Place 1-2 sprays into both nostrils daily as needed for allergies or rhinitis.    . Polyvinyl Alcohol-Povidone (REFRESH OP) Place 1-2 drops into both eyes daily as needed (for dry eyes).     No current facility-administered medications for this visit.     Allergies:   Methotrexate derivatives, Plaquenil [hydroxychloroquine sulfate], Remicade [infliximab], Ridaura [auranofin], and Sudafed [pseudoephedrine]   Social History:  The patient  reports that she has never smoked. She has never used smokeless tobacco. She reports that she does not drink alcohol or use drugs.   Family History:  The patient's  family history includes CAD (age of onset: 24) in her brother; Coronary artery disease in her mother; Heart disease in her father; Hypertension in her father; Pancreatic cancer in her father.   ROS:  Please see the history of present illness.   All other systems are personally reviewed and negative.    Exam:    Vital Signs:  BP 125/60   Pulse 60   LMP 04/15/1990   no acute distress, no shortness of breath.  Labs/Other Tests and Data Reviewed:    Recent Labs: 09/23/2018: ALT 24; BUN 20; Creatinine, Ser 0.75; Hemoglobin 13.2; Platelets 165; Potassium 4.3; Sodium 143   Wt Readings from Last 3 Encounters:  09/21/18 112 lb (50.8 kg)  01/20/18 116 lb 9.6 oz (52.9 kg)  12/30/17 118 lb (53.5 kg)     Other studies personally reviewed: Additional studies/ records that were reviewed today include: ECG 01/20/18 personally reviewed  Review of the above records  today demonstrates:  R, rate 62   Last device remote is reviewed from [facility] PDF dated 10/20/18 which reveals normal device function, no arrhythmias    ASSESSMENT & PLAN:    1.  Complete heart block: s/p Medtronic dual chamber pacemaker implanted 08/11/17.  Device functioning appropriately.  No changes.  2. Coronary artery disease: s/p DES 07/2017. Continue aspirin.   COVID 19 screen The patient denies symptoms of COVID 19 at this time.  The importance of social distancing was discussed today.  Follow-up:  1 year  Current medicines are reviewed at length with the patient today.   The patient does not have concerns regarding her medicines.  The following changes were made today:  none  Labs/ tests ordered today include:  No orders of the defined types were placed in this encounter.    Patient Risk:  after full review of this patients clinical status, I feel that they are at moderate risk at this time.  Today, I have spent 6 minutes with the  patient with telehealth technology discussing pacemaker .    Signed,  Meredith Leeds, MD  10/26/2018 3:44 PM     [facility] [address] [place] [address] [phone] (office) [phone] (fax)

## 2018-10-30 ENCOUNTER — Telehealth: Payer: Medicare Other | Admitting: Cardiology

## 2018-10-31 ENCOUNTER — Encounter: Payer: Self-pay | Admitting: Cardiology

## 2018-10-31 NOTE — Progress Notes (Signed)
Remote pacemaker transmission.   

## 2018-11-13 DIAGNOSIS — M0579 Rheumatoid arthritis with rheumatoid factor of multiple sites without organ or systems involvement: Secondary | ICD-10-CM | POA: Diagnosis not present

## 2018-11-13 DIAGNOSIS — Z79899 Other long term (current) drug therapy: Secondary | ICD-10-CM | POA: Diagnosis not present

## 2018-12-11 DIAGNOSIS — M0579 Rheumatoid arthritis with rheumatoid factor of multiple sites without organ or systems involvement: Secondary | ICD-10-CM | POA: Diagnosis not present

## 2018-12-16 DIAGNOSIS — Z1231 Encounter for screening mammogram for malignant neoplasm of breast: Secondary | ICD-10-CM | POA: Diagnosis not present

## 2019-01-08 DIAGNOSIS — M0579 Rheumatoid arthritis with rheumatoid factor of multiple sites without organ or systems involvement: Secondary | ICD-10-CM | POA: Diagnosis not present

## 2019-01-13 DIAGNOSIS — Z85820 Personal history of malignant melanoma of skin: Secondary | ICD-10-CM | POA: Diagnosis not present

## 2019-01-13 DIAGNOSIS — D1801 Hemangioma of skin and subcutaneous tissue: Secondary | ICD-10-CM | POA: Diagnosis not present

## 2019-01-13 DIAGNOSIS — D2262 Melanocytic nevi of left upper limb, including shoulder: Secondary | ICD-10-CM | POA: Diagnosis not present

## 2019-01-13 DIAGNOSIS — L821 Other seborrheic keratosis: Secondary | ICD-10-CM | POA: Diagnosis not present

## 2019-01-13 DIAGNOSIS — D225 Melanocytic nevi of trunk: Secondary | ICD-10-CM | POA: Diagnosis not present

## 2019-01-13 DIAGNOSIS — D224 Melanocytic nevi of scalp and neck: Secondary | ICD-10-CM | POA: Diagnosis not present

## 2019-01-13 DIAGNOSIS — D2272 Melanocytic nevi of left lower limb, including hip: Secondary | ICD-10-CM | POA: Diagnosis not present

## 2019-01-13 DIAGNOSIS — L814 Other melanin hyperpigmentation: Secondary | ICD-10-CM | POA: Diagnosis not present

## 2019-01-13 DIAGNOSIS — D2271 Melanocytic nevi of right lower limb, including hip: Secondary | ICD-10-CM | POA: Diagnosis not present

## 2019-01-13 DIAGNOSIS — L853 Xerosis cutis: Secondary | ICD-10-CM | POA: Diagnosis not present

## 2019-01-16 DIAGNOSIS — Z23 Encounter for immunization: Secondary | ICD-10-CM | POA: Diagnosis not present

## 2019-01-19 ENCOUNTER — Ambulatory Visit (INDEPENDENT_AMBULATORY_CARE_PROVIDER_SITE_OTHER): Payer: Medicare Other | Admitting: *Deleted

## 2019-01-19 DIAGNOSIS — I441 Atrioventricular block, second degree: Secondary | ICD-10-CM

## 2019-01-19 DIAGNOSIS — I471 Supraventricular tachycardia: Secondary | ICD-10-CM

## 2019-01-20 DIAGNOSIS — I442 Atrioventricular block, complete: Secondary | ICD-10-CM | POA: Diagnosis not present

## 2019-01-20 DIAGNOSIS — K219 Gastro-esophageal reflux disease without esophagitis: Secondary | ICD-10-CM | POA: Diagnosis not present

## 2019-01-20 DIAGNOSIS — M069 Rheumatoid arthritis, unspecified: Secondary | ICD-10-CM | POA: Diagnosis not present

## 2019-01-20 DIAGNOSIS — Z950 Presence of cardiac pacemaker: Secondary | ICD-10-CM | POA: Diagnosis not present

## 2019-01-20 DIAGNOSIS — M1990 Unspecified osteoarthritis, unspecified site: Secondary | ICD-10-CM | POA: Diagnosis not present

## 2019-01-20 DIAGNOSIS — Z1389 Encounter for screening for other disorder: Secondary | ICD-10-CM | POA: Diagnosis not present

## 2019-01-20 DIAGNOSIS — F3340 Major depressive disorder, recurrent, in remission, unspecified: Secondary | ICD-10-CM | POA: Diagnosis not present

## 2019-01-20 DIAGNOSIS — E7800 Pure hypercholesterolemia, unspecified: Secondary | ICD-10-CM | POA: Diagnosis not present

## 2019-01-20 DIAGNOSIS — I208 Other forms of angina pectoris: Secondary | ICD-10-CM | POA: Diagnosis not present

## 2019-01-20 DIAGNOSIS — I2510 Atherosclerotic heart disease of native coronary artery without angina pectoris: Secondary | ICD-10-CM | POA: Diagnosis not present

## 2019-01-20 LAB — CUP PACEART REMOTE DEVICE CHECK
Battery Remaining Longevity: 116 mo
Battery Voltage: 3.01 V
Brady Statistic AP VP Percent: 41.58 %
Brady Statistic AP VS Percent: 0.17 %
Brady Statistic AS VP Percent: 58.02 %
Brady Statistic AS VS Percent: 0.24 %
Brady Statistic RA Percent Paced: 41.75 %
Brady Statistic RV Percent Paced: 99.59 %
Date Time Interrogation Session: 20201007132342
Implantable Lead Implant Date: 20190429
Implantable Lead Implant Date: 20190429
Implantable Lead Location: 753859
Implantable Lead Location: 753860
Implantable Lead Model: 5076
Implantable Lead Model: 5076
Implantable Pulse Generator Implant Date: 20190429
Lead Channel Impedance Value: 361 Ohm
Lead Channel Impedance Value: 361 Ohm
Lead Channel Impedance Value: 399 Ohm
Lead Channel Impedance Value: 532 Ohm
Lead Channel Pacing Threshold Amplitude: 0.5 V
Lead Channel Pacing Threshold Amplitude: 0.75 V
Lead Channel Pacing Threshold Pulse Width: 0.4 ms
Lead Channel Pacing Threshold Pulse Width: 0.4 ms
Lead Channel Sensing Intrinsic Amplitude: 2.5 mV
Lead Channel Sensing Intrinsic Amplitude: 2.5 mV
Lead Channel Sensing Intrinsic Amplitude: 20.125 mV
Lead Channel Sensing Intrinsic Amplitude: 20.125 mV
Lead Channel Setting Pacing Amplitude: 1.5 V
Lead Channel Setting Pacing Amplitude: 2.5 V
Lead Channel Setting Pacing Pulse Width: 0.4 ms
Lead Channel Setting Sensing Sensitivity: 1.2 mV

## 2019-01-27 NOTE — Progress Notes (Signed)
Remote pacemaker transmission.   

## 2019-01-29 ENCOUNTER — Other Ambulatory Visit: Payer: Self-pay | Admitting: Cardiology

## 2019-02-07 ENCOUNTER — Other Ambulatory Visit: Payer: Self-pay | Admitting: Cardiology

## 2019-02-08 DIAGNOSIS — Z79899 Other long term (current) drug therapy: Secondary | ICD-10-CM | POA: Diagnosis not present

## 2019-02-08 DIAGNOSIS — M0579 Rheumatoid arthritis with rheumatoid factor of multiple sites without organ or systems involvement: Secondary | ICD-10-CM | POA: Diagnosis not present

## 2019-03-08 DIAGNOSIS — M0579 Rheumatoid arthritis with rheumatoid factor of multiple sites without organ or systems involvement: Secondary | ICD-10-CM | POA: Diagnosis not present

## 2019-03-29 DIAGNOSIS — H0288B Meibomian gland dysfunction left eye, upper and lower eyelids: Secondary | ICD-10-CM | POA: Diagnosis not present

## 2019-03-29 DIAGNOSIS — Z961 Presence of intraocular lens: Secondary | ICD-10-CM | POA: Diagnosis not present

## 2019-03-29 DIAGNOSIS — H0288A Meibomian gland dysfunction right eye, upper and lower eyelids: Secondary | ICD-10-CM | POA: Diagnosis not present

## 2019-03-29 DIAGNOSIS — H40023 Open angle with borderline findings, high risk, bilateral: Secondary | ICD-10-CM | POA: Diagnosis not present

## 2019-03-29 DIAGNOSIS — H0102B Squamous blepharitis left eye, upper and lower eyelids: Secondary | ICD-10-CM | POA: Diagnosis not present

## 2019-03-29 DIAGNOSIS — H0102A Squamous blepharitis right eye, upper and lower eyelids: Secondary | ICD-10-CM | POA: Diagnosis not present

## 2019-03-29 DIAGNOSIS — H11153 Pinguecula, bilateral: Secondary | ICD-10-CM | POA: Diagnosis not present

## 2019-03-29 DIAGNOSIS — H40053 Ocular hypertension, bilateral: Secondary | ICD-10-CM | POA: Diagnosis not present

## 2019-03-29 DIAGNOSIS — H18413 Arcus senilis, bilateral: Secondary | ICD-10-CM | POA: Diagnosis not present

## 2019-03-29 DIAGNOSIS — H04123 Dry eye syndrome of bilateral lacrimal glands: Secondary | ICD-10-CM | POA: Diagnosis not present

## 2019-04-20 ENCOUNTER — Ambulatory Visit (INDEPENDENT_AMBULATORY_CARE_PROVIDER_SITE_OTHER): Payer: Medicare Other | Admitting: *Deleted

## 2019-04-20 DIAGNOSIS — Z950 Presence of cardiac pacemaker: Secondary | ICD-10-CM | POA: Diagnosis not present

## 2019-04-20 LAB — CUP PACEART REMOTE DEVICE CHECK
Battery Remaining Longevity: 114 mo
Battery Voltage: 3 V
Brady Statistic AP VP Percent: 23.01 %
Brady Statistic AP VS Percent: 0 %
Brady Statistic AS VP Percent: 76.95 %
Brady Statistic AS VS Percent: 0.04 %
Brady Statistic RA Percent Paced: 22.96 %
Brady Statistic RV Percent Paced: 99.96 %
Date Time Interrogation Session: 20210105025359
Implantable Lead Implant Date: 20190429
Implantable Lead Implant Date: 20190429
Implantable Lead Location: 753859
Implantable Lead Location: 753860
Implantable Lead Model: 5076
Implantable Lead Model: 5076
Implantable Pulse Generator Implant Date: 20190429
Lead Channel Impedance Value: 361 Ohm
Lead Channel Impedance Value: 361 Ohm
Lead Channel Impedance Value: 418 Ohm
Lead Channel Impedance Value: 532 Ohm
Lead Channel Pacing Threshold Amplitude: 0.625 V
Lead Channel Pacing Threshold Amplitude: 0.75 V
Lead Channel Pacing Threshold Pulse Width: 0.4 ms
Lead Channel Pacing Threshold Pulse Width: 0.4 ms
Lead Channel Sensing Intrinsic Amplitude: 2.375 mV
Lead Channel Sensing Intrinsic Amplitude: 2.375 mV
Lead Channel Sensing Intrinsic Amplitude: 20.125 mV
Lead Channel Sensing Intrinsic Amplitude: 20.125 mV
Lead Channel Setting Pacing Amplitude: 1.75 V
Lead Channel Setting Pacing Amplitude: 2.5 V
Lead Channel Setting Pacing Pulse Width: 0.4 ms
Lead Channel Setting Sensing Sensitivity: 1.2 mV

## 2019-04-21 NOTE — Progress Notes (Signed)
PPM remote 

## 2019-05-03 DIAGNOSIS — Z79899 Other long term (current) drug therapy: Secondary | ICD-10-CM | POA: Diagnosis not present

## 2019-05-03 DIAGNOSIS — M0579 Rheumatoid arthritis with rheumatoid factor of multiple sites without organ or systems involvement: Secondary | ICD-10-CM | POA: Diagnosis not present

## 2019-06-01 DIAGNOSIS — M0579 Rheumatoid arthritis with rheumatoid factor of multiple sites without organ or systems involvement: Secondary | ICD-10-CM | POA: Diagnosis not present

## 2019-06-29 DIAGNOSIS — M0579 Rheumatoid arthritis with rheumatoid factor of multiple sites without organ or systems involvement: Secondary | ICD-10-CM | POA: Diagnosis not present

## 2019-07-20 ENCOUNTER — Ambulatory Visit (INDEPENDENT_AMBULATORY_CARE_PROVIDER_SITE_OTHER): Payer: Medicare Other | Admitting: *Deleted

## 2019-07-20 DIAGNOSIS — Z950 Presence of cardiac pacemaker: Secondary | ICD-10-CM

## 2019-07-20 LAB — CUP PACEART REMOTE DEVICE CHECK
Battery Remaining Longevity: 112 mo
Battery Voltage: 3 V
Brady Statistic AP VP Percent: 37.66 %
Brady Statistic AP VS Percent: 0.01 %
Brady Statistic AS VP Percent: 62.29 %
Brady Statistic AS VS Percent: 0.04 %
Brady Statistic RA Percent Paced: 37.6 %
Brady Statistic RV Percent Paced: 99.95 %
Date Time Interrogation Session: 20210405195212
Implantable Lead Implant Date: 20190429
Implantable Lead Implant Date: 20190429
Implantable Lead Location: 753859
Implantable Lead Location: 753860
Implantable Lead Model: 5076
Implantable Lead Model: 5076
Implantable Pulse Generator Implant Date: 20190429
Lead Channel Impedance Value: 361 Ohm
Lead Channel Impedance Value: 361 Ohm
Lead Channel Impedance Value: 437 Ohm
Lead Channel Impedance Value: 532 Ohm
Lead Channel Pacing Threshold Amplitude: 0.625 V
Lead Channel Pacing Threshold Amplitude: 0.75 V
Lead Channel Pacing Threshold Pulse Width: 0.4 ms
Lead Channel Pacing Threshold Pulse Width: 0.4 ms
Lead Channel Sensing Intrinsic Amplitude: 19.125 mV
Lead Channel Sensing Intrinsic Amplitude: 19.125 mV
Lead Channel Sensing Intrinsic Amplitude: 3.5 mV
Lead Channel Sensing Intrinsic Amplitude: 3.5 mV
Lead Channel Setting Pacing Amplitude: 1.5 V
Lead Channel Setting Pacing Amplitude: 2.5 V
Lead Channel Setting Pacing Pulse Width: 0.4 ms
Lead Channel Setting Sensing Sensitivity: 1.2 mV

## 2019-07-21 NOTE — Progress Notes (Signed)
PPM Remote  

## 2019-07-27 DIAGNOSIS — M0579 Rheumatoid arthritis with rheumatoid factor of multiple sites without organ or systems involvement: Secondary | ICD-10-CM | POA: Diagnosis not present

## 2019-08-24 DIAGNOSIS — M0579 Rheumatoid arthritis with rheumatoid factor of multiple sites without organ or systems involvement: Secondary | ICD-10-CM | POA: Diagnosis not present

## 2019-09-10 ENCOUNTER — Telehealth: Payer: Self-pay | Admitting: Cardiology

## 2019-09-10 NOTE — Telephone Encounter (Signed)
I called patient 09/10/19 to schedule follow up appointment from Dr. Doug Sou recall list. The patient didn't answer so I left message for patient to return call to be scheduled for appt

## 2019-09-21 DIAGNOSIS — M0579 Rheumatoid arthritis with rheumatoid factor of multiple sites without organ or systems involvement: Secondary | ICD-10-CM | POA: Diagnosis not present

## 2019-10-11 NOTE — Progress Notes (Deleted)
Virtual Visit via Video Note   This visit type was conducted due to national recommendations for restrictions regarding the COVID-19 Pandemic (e.g. social distancing) in an effort to limit this patient's exposure and mitigate transmission in our community.  Due to her co-morbid illnesses, this patient is at least at moderate risk for complications without adequate follow up.  This format is felt to be most appropriate for this patient at this time.  All issues noted in this document were discussed and addressed.  A limited physical exam was performed with this format.  Please refer to the patient's chart for her consent to telehealth for [facility].   Date:  10/11/2019   ID:  Elizabeth Estrada, DOB 1940-11-10, MRN [id number]  Patient Location: Home Provider Location: Office  PCP:  [name], MD  Cardiologist:  Keyon Liller Martinique, MD  Electrophysiologist:  [name], MD   Evaluation Performed:  Follow-Up Visit  Chief Complaint:  Follow up CAD  History of Present Illness:    Elizabeth Estrada is a 79 y.o. female with history of CAD and complete heart block. She has a history of MVP and family history of CAD. She has a history of murmur. Echo in 2006 mentions mild prolapse but in 2013 she had an Echo showing a small pericardial effusion otherwise normal. No prolapse seen. She also had a normal stress Echo at that time.   She was seen in January 2019 with some palpitations. Seen again in April with symptoms of exertional chest pressure. Echo showed mild to moderate mitral stenosis. ETT was done and demonstrated very poor exercise tolerance with hypertensive BP response. There was 2:1 AV block noted. An event monitor showed episodes of complete heart block. She underwent right and left heart cath. This showed an 80% ostial RCA stenosis and 70% diagonal. There was mild pulmonary HTN without significant mitral stenosis. EDP was elevated c/w diastolic dysfunction. She underwent  stenting of the ostial RCA with DES. She was placed on DAPT. Post stent she had persistent episodes of CHB despite correcting ischemia and underwent DDD pacemaker placement on 08/11/17.  Seen in the device clinic on 5/14. RV threshold was elevated. CXR showed no change in lead position.  She was seen by [name] in August 2019. Had persistent SOB. Repeat Echo showed no change. 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   She had a normal pacemaker check remotely on 07/19/19.   On our visit today she just notes that she is down in the dumps with the pandemic. She has not been getting any exercise. He appetite has been poor and she has lost weight. No nausea. Denies any chest pain, dyspnea, palpitations, dizziness, or edema  The patient does not have symptoms concerning for COVID-19 infection (fever, chills, cough, or new shortness of breath).    Past Medical History:  Diagnosis Date  . 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 Chronic depression   . Coronary artery disease   . Family history of adverse reaction to anesthesia    "dad had PONV"  . Fibromyalgia   . GERD (gastroesophageal reflux disease)   . Heart murmur   . History of kidney stones   . Hypertension    "before stent and pacemaker" (10/13/2017)  . Melanoma ([place])    removed from back  . MVP (mitral valve prolapse)   . Osteoarthritis   . Osteoporosis   . Presence of permanent  cardiac pacemaker 08/08/2017  . RA (rheumatoid arthritis) ([place])    "all over; mainly hands and knees" (10/13/2017)  . Rib fracture    Past Surgical History:  Procedure Laterality Date  . BREAST BIOPSY Left 11/2003   benign - Dr. Margot Chimes  . CARDIAC CATHETERIZATION    . CARDIOVASCULAR STRESS TEST  10/19/1999, 2013   EF 97%, NO EVIDENCE OF ISCHEMIA, 2013 NORMAL  . CATARACT EXTRACTION W/ INTRAOCULAR LENS  IMPLANT, BILATERAL Bilateral   . COLONOSCOPY    . CORONARY STENT INTERVENTION N/A 08/08/2017   Procedure: CORONARY  STENT INTERVENTION;  Surgeon: [name], MD;  Location: [facility];  Service: Cardiovascular;  Laterality: N/A;  . DILATATION & CURRETTAGE/HYSTEROSCOPY WITH RESECTOCOPE N/A 07/06/2013   Procedure: DILATATION & CURETTAGE/HYSTEROSCOPY WITH RESECTOCOPE with removal of endometrial lesion;  Surgeon: [name], MD;  Location: [facility];  Service: Gynecology;  Laterality: N/A;  . LEAD REVISION/REPAIR N/A 10/13/2017   Procedure: LEAD REVISION/REPAIR;  Surgeon: [name], MD;  Location: [facility];  Service: Cardiovascular;  Laterality: N/A;  . MELANOMA EXCISION  1990s?   from back  . PACEMAKER IMPLANT N/A 08/11/2017   Procedure: PACEMAKER IMPLANT;  Surgeon: [name], MD;  Location: [facility];  Service: Cardiovascular;  Laterality: N/A;  . RIGHT/LEFT HEART CATH AND CORONARY ANGIOGRAPHY N/A 08/08/2017   Procedure: RIGHT/LEFT HEART CATH AND CORONARY ANGIOGRAPHY;  Surgeon: [name], MD;  Location: [facility];  Service: Cardiovascular;  Laterality: N/A;     No outpatient medications have been marked as taking for the 10/13/19 encounter (Appointment) with Martinique, Keylie Beavers M, MD.     Allergies:   Methotrexate derivatives, Plaquenil [hydroxychloroquine sulfate], Remicade [infliximab], Ridaura [auranofin], and Sudafed [pseudoephedrine]   Social History   Tobacco Use  . Smoking status: Never Smoker  . Smokeless tobacco: Never Used  Vaping Use  . Vaping Use: Never used  Substance Use Topics  . Alcohol use: Never  . Drug use: Never     Family Hx: The patient's family history includes CAD (age of onset: 80) in her brother; Coronary artery disease in her mother; Heart disease in her father; Hypertension in her father; Pancreatic cancer in her father.  ROS:   Please see the history of present illness.    All other systems reviewed and are negative.   Prior CV studies:   The following studies were reviewed today:  Event monitor  07/24/17: Study Highlights    Normal sinus rhythm  Periods of intermittent complete heart block and Type 2 second degree AV block with 2:1 conduction   Echo 07/31/17: Study Conclusions  - Left ventricle: The cavity size was normal. Wall thickness was normal. Systolic function was normal. The estimated ejection fraction was in the range of 60% to 65%. Wall motion was normal; there were no regional wall motion abnormalities. Doppler parameters are consistent with abnormal left ventricular relaxation (grade 1 diastolic dysfunction). - Aortic valve: There was no stenosis. - Mitral valve: The findings are consistent with moderate stenosis. There was no significant regurgitation. Mean gradient (D): 8 mm Hg. Valve area by pressure half-time: 1.49 cm^2. - Left atrium: The atrium was moderately dilated. - Right ventricle: The cavity size was normal. Systolic function was normal. - Tricuspid valve: Peak RV-RA gradient (S): 26 mm Hg. - Pulmonary arteries: PA peak pressure: 29 mm Hg (S). - Inferior vena cava: The vessel was normal in size. The respirophasic diameter changes were in the normal  range (>= 50%), consistent with normal central venous pressure. - Pericardium, extracardiac: Small to moderate pericardial effusion. <25% respirophasic variation of mitral E inflow velocity. IVC is not dilated. No significant RV diastolic collapse. No pericardial tamponade.  Impressions:  - Normal LV size with EF 60-65%. Normal RV size and systolic function. 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 There was a small to moderate pericardial effusion without tamponade.  ETT 08/05/17: Study Highlights     Blood pressure demonstrated a hypertensive response to exercise.  There was no ST segment deviation noted during stress.  Abnormal ETT with poor exercise tolerance (1:01); pt complained of chest discomfort during the study;  hypertensive BP response; pt developed 2:1 AV block with exertion (that correlated with chest pain) that resolved in recovery; no ST changes. Pt reviewed with Dr Curt Bears prior to DC.    Cardiac cath/PCI: 08/08/17: Conclusion    80% ostial RCA. The RCA is dominant.  30 to 40% proximal and mid LAD. 70% proximal diagonal #1.  Widely patent circumflex with tortuous obtuse marginals.  Normal left main  Calcified mitral valve and annulus. No significant mitral stenosis is noted.  Mild pulmonary hypertension  Normal left ventricular systolic function with elevated end-diastolic and pulmonary wedge pressure in the 20 to 22 mmHg range consistent with diastolic heart failure, chronic.  Successful PCI and stent of the ostial RCA from 80% with TIMI grade III flow to less than 10% with TIMI grade III flow using a 3.0 x 12 mm Synergy postdilated to 3.5 mmHg pressure.  RECOMMENDATIONS:   Aspirin and Plavix for at least 6 months.  Pacemaker may be needed if continued prolonged episodes of second-degree heart block with bradycardia.  Aggressive risk factor modification.  Probable discharge in a.m. if no complications.      Pacemaker implant 08/11/17: CONCLUSIONS:  1. Successful implantation of a Medtronic Azure XT DR MRI SureScan dual-chamber pacemaker for symptomatic bradycardia 2. No early apparent complications.   Echo 12/05/17: Study Conclusions  - Left ventricle: The cavity size was normal. Systolic function was vigorous. The estimated ejection fraction was in the range of 65% to 70%. Wall motion was normal; there were no regional wall motion abnormalities. Doppler parameters are consistent with abnormal left ventricular relaxation (grade 1 diastolic dysfunction). Doppler parameters are consistent with elevated ventricular end-diastolic filling pressure. - Mitral valve: Moderately thickened, moderately calcified leaflets . The findings are consistent  with moderate stenosis. There was mild regurgitation. Mean gradient (D): 7 mm Hg. Valve area by continuity equation (using LVOT flow): 1.17 cm^2. - Left atrium: The atrium was mildly dilated. - Right ventricle: Systolic function was normal. - Right atrium: The atrium was mildly dilated. - Tricuspid valve: There was mild regurgitation. - Pericardium, extracardiac: A mild to moderate circumferential pericardial effusion was identified. Features were not consistent with tamponade physiology.  Impressions:  - No significant change since the prior study on 07/31/2017.    Labs/Other Tests and Data Reviewed:    EKG:  No ECG reviewed.  Recent Labs: No results found for requested labs within last 8760 hours.   Recent Lipid Panel Lab Results  Component Value Date/Time   CHOL 147 09/23/2018 11:57 AM   TRIG 44 09/23/2018 11:57 AM   HDL 73 09/23/2018 11:57 AM   CHOLHDL 2.0 09/23/2018 11:57 AM   LDLCALC 65 09/23/2018 11:57 AM   Dated 01/07/17: cholesterol 147, triglycerides 37, HDL 75, LDL 65. CMET and CBC normal. Dated 05/03/19: Normal CMET and CBC.  Wt  Readings from Last 3 Encounters:  09/21/18 112 lb (50.8 kg)  01/20/18 116 lb 9.6 oz (52.9 kg)  12/30/17 118 lb (53.5 kg)     Objective:    Vital Signs:  LMP 04/15/1990    VITAL SIGNS:  reviewed  General: no distress HEENT normal Respirations unlabored. Neuro alert and oriented x 3.  Mood normal  ASSESSMENT & PLAN:     1. CAD. S/p DES of ostial RCA on 08/08/17. Recommend stopping Plavix at this time.  Continue statin. Encouraged more aerobic exercise. Will treat residual disease in a diagonal branch medically.  2. CHB s/p DDD pacemaker insertion on 08/11/17. Follow up with EP 3. Mild-moderate  Mitral stenosis on Echo. Right heart cath without significant stenosis/vavle gradient 4. Diastolic dysfunction- asymptomatic.  5. RA. Follow up with  Dr. Amil Amen. On [place] 6. HLD will check fasting lab work  COVID-19  Education: The signs and symptoms of COVID-19 were discussed with the patient and how to seek care for testing (follow up with PCP or arrange E-visit).  The importance of social distancing was discussed today.  Time:   Today, I have spent 15 minutes with the patient with telehealth technology discussing the above problems.     Medication Adjustments/Labs and Tests Ordered: Current medicines are reviewed at length with the patient today.  Concerns regarding medicines are outlined above.   Tests Ordered: No orders of the defined types were placed in this encounter.   Medication Changes: No orders of the defined types were placed in this encounter.   Disposition:  Follow up in 1 year(s)  Signed, Suhaib Guzzo Martinique, MD  10/11/2019 1:13 PM    [place]

## 2019-10-12 ENCOUNTER — Telehealth: Payer: Self-pay | Admitting: Cardiology

## 2019-10-12 NOTE — Telephone Encounter (Signed)
New message   Patient's sister would like a call back. She has questions about this patient.

## 2019-10-12 NOTE — Telephone Encounter (Signed)
Returned call to patient's sister Blanch Media no answer.[place].

## 2019-10-13 ENCOUNTER — Telehealth: Payer: Medicare Other | Admitting: Cardiology

## 2019-10-13 ENCOUNTER — Telehealth (INDEPENDENT_AMBULATORY_CARE_PROVIDER_SITE_OTHER): Payer: Medicare Other | Admitting: Cardiology

## 2019-10-13 ENCOUNTER — Encounter: Payer: Self-pay | Admitting: Cardiology

## 2019-10-13 ENCOUNTER — Ambulatory Visit: Payer: Medicare Other | Admitting: Cardiology

## 2019-10-13 ENCOUNTER — Telehealth: Payer: Self-pay

## 2019-10-13 VITALS — BP 117/62 | HR 64 | Ht 62.5 in | Wt 107.0 lb

## 2019-10-13 DIAGNOSIS — I313 Pericardial effusion (noninflammatory): Secondary | ICD-10-CM

## 2019-10-13 DIAGNOSIS — I050 Rheumatic mitral stenosis: Secondary | ICD-10-CM | POA: Diagnosis not present

## 2019-10-13 DIAGNOSIS — Z950 Presence of cardiac pacemaker: Secondary | ICD-10-CM

## 2019-10-13 DIAGNOSIS — Z9861 Coronary angioplasty status: Secondary | ICD-10-CM

## 2019-10-13 DIAGNOSIS — I442 Atrioventricular block, complete: Secondary | ICD-10-CM | POA: Diagnosis not present

## 2019-10-13 DIAGNOSIS — I2510 Atherosclerotic heart disease of native coronary artery without angina pectoris: Secondary | ICD-10-CM | POA: Diagnosis not present

## 2019-10-13 DIAGNOSIS — I3139 Other pericardial effusion (noninflammatory): Secondary | ICD-10-CM

## 2019-10-13 NOTE — Addendum Note (Signed)
Addended by: [name] on: 10/13/2019 09:54 AM   Modules accepted: Orders

## 2019-10-13 NOTE — Patient Instructions (Signed)
Medication Instructions:  Continue same medications *If you need a refill on your cardiac medications before your next appointment, please call your pharmacy*   Lab Work: Bmet,lipid and hepatic panels     Testing/Procedures: None ordered   Follow-Up: At [facility], you and your health needs are our priority.  As part of our continuing mission to provide you with exceptional heart care, we have created designated Provider Care Teams.  These Care Teams include your primary Cardiologist (physician) and Advanced Practice Providers (APPs -  Physician Assistants and Nurse Practitioners) who all work together to provide you with the care you need, when you need it.  We recommend signing up for the patient portal called "MyChart".  Sign up information is provided on this After Visit Summary.  MyChart is used to connect with patients for Virtual Visits (Telemedicine).  Patients are able to view lab/test results, encounter notes, upcoming appointments, etc.  Non-urgent messages can be sent to your provider as well.   To learn more about what you can do with MyChart, go to [email or web address].      Your next appointment:  1 year  Call in March to schedule June appointment    The format for your next appointment: Office     Provider:  Dr.Jordan

## 2019-10-13 NOTE — Telephone Encounter (Signed)
  Patient Consent for Virtual Visit         Elizabeth Estrada has provided verbal consent on 10/13/2019 for a virtual visit (video or telephone).   CONSENT FOR VIRTUAL VISIT FOR:  Elizabeth Estrada  By participating in this virtual visit I agree to the following:  I hereby voluntarily request, consent and authorize [place] and its employed or contracted physicians, physician assistants, nurse practitioners or other licensed health care professionals (the Practitioner), to provide me with telemedicine health care services (the "Services") as deemed necessary by the treating Practitioner. I acknowledge and consent to receive the Services by the Practitioner via telemedicine. I understand that the telemedicine visit will involve communicating with the Practitioner through live audiovisual communication technology and the disclosure of certain medical information by electronic transmission. I acknowledge that I have been given the opportunity to request an in-person assessment or other available alternative prior to the telemedicine visit and am voluntarily participating in the telemedicine visit.  I understand that I have the right to withhold or withdraw my consent to the use of telemedicine in the course of my care at any time, without affecting my right to future care or treatment, and that the Practitioner or I may terminate the telemedicine visit at any time. I understand that I have the right to inspect all information obtained and/or recorded in the course of the telemedicine visit and may receive copies of available information for a reasonable fee.  I understand that some of the potential risks of receiving the Services via telemedicine include:  Marland Kitchen Delay or interruption in medical evaluation due to technological equipment failure or disruption; . Information transmitted may not be sufficient (e.g. poor resolution of images) to allow for appropriate medical decision making by the  Practitioner; and/or  . In rare instances, security protocols could fail, causing a breach of personal health information.  Furthermore, I acknowledge that it is my responsibility to provide information about my medical history, conditions and care that is complete and accurate to the best of my ability. I acknowledge that Practitioner's advice, recommendations, and/or decision may be based on factors not within their control, such as incomplete or inaccurate data provided by me or distortions of diagnostic images or specimens that may result from electronic transmissions. I understand that the practice of medicine is not an exact science and that Practitioner makes no warranties or guarantees regarding treatment outcomes. I acknowledge that a copy of this consent can be made available to me via my patient portal ([facility]), or I can request a printed copy by calling the office of [facility].    I understand that my insurance will be billed for this visit.   I have read or had this consent read to me. . I understand the contents of this consent, which adequately explains the benefits and risks of the Services being provided via telemedicine.  . I have been provided ample opportunity to ask questions regarding this consent and the Services and have had my questions answered to my satisfaction. . I give my informed consent for the services to be provided through the use of telemedicine in my medical care

## 2019-10-13 NOTE — Progress Notes (Signed)
Virtual Visit via Video Note   This visit type was conducted due to national recommendations for restrictions regarding the COVID-19 Pandemic (e.g. social distancing) in an effort to limit this patient's exposure and mitigate transmission in our community.  Due to her co-morbid illnesses, this patient is at least at moderate risk for complications without adequate follow up.  This format is felt to be most appropriate for this patient at this time. Attempted to do video visit but due to technical difficulties we changed to phone visit. All issues noted in this document were discussed and addressed.   Please refer to the patient's chart for her consent to telehealth for [facility].   Date:  10/13/2019   ID:  Elizabeth Estrada, DOB 10-04-1940, MRN [id number]  Patient Location: Home Provider location: Home  PCP:  Elizabeth Arabian, MD  Cardiologist:  Elizabeth Greenawalt Martinique, MD  Electrophysiologist:  Elizabeth Haw, MD   Evaluation Performed:  Follow-Up Visit  Chief Complaint:  Follow up CAD  History of Present Illness:    Elizabeth Estrada is a 79 y.o. female with history of CAD and complete heart block. She has a history of MVP and family history of CAD. She has a history of murmur. Echo in 2006 mentions mild prolapse but in 2013 she had an Echo showing a small pericardial effusion otherwise normal. No prolapse seen. She also had a normal stress Echo at that time.   She was seen in January 2019 with some palpitations. Seen again in April with symptoms of exertional chest pressure. Echo showed mild to moderate mitral stenosis. ETT was done and demonstrated very poor exercise tolerance with hypertensive BP response. There was 2:1 AV block noted. An event monitor showed episodes of complete heart block. She underwent right and left heart cath. This showed an 80% ostial RCA stenosis and 70% diagonal. There was mild pulmonary HTN without significant mitral stenosis. EDP was elevated c/w diastolic  dysfunction. She underwent stenting of the ostial RCA with DES. She was placed on DAPT. Post stent she had persistent episodes of CHB despite correcting ischemia and underwent DDD pacemaker placement on 08/11/17.  Seen in the device clinic on 5/14. RV threshold was elevated. CXR showed no change in lead position.   She was seen by [name] in August 2019. Had persistent SOB. Repeat Echo showed no change. 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   She had a normal pacemaker check remotely on 07/19/19   On our visit today she just notes that she is doing well. Admits she needs to get more exercise.  He appetite has been poor and she notes a change in her taste.  No nausea. Denies any chest pain, dyspnea, palpitations, dizziness, or edema  The patient does not have symptoms concerning for COVID-19 infection (fever, chills, cough, or new shortness of breath).    Past Medical History:  Diagnosis Date  . 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 Chronic depression   . Coronary artery disease   . Family history of adverse reaction to anesthesia    "dad had PONV"  . Fibromyalgia   . GERD (gastroesophageal reflux disease)   . Heart murmur   . History of kidney stones   . Hypertension    "before stent and pacemaker" (10/13/2017)  . Melanoma ([place])    removed from back  . MVP (mitral valve prolapse)   . Osteoarthritis   . Osteoporosis   .  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 RA (rheumatoid arthritis) ([place])    "all over; mainly hands and knees" (10/13/2017)  . Rib fracture    Past Surgical History:  Procedure Laterality Date  . BREAST BIOPSY Left 11/2003   benign - Dr. Margot Chimes  . CARDIAC CATHETERIZATION    . CARDIOVASCULAR STRESS TEST  10/19/1999, 2013   EF 97%, NO EVIDENCE OF ISCHEMIA, 2013 NORMAL  . CATARACT EXTRACTION W/ INTRAOCULAR LENS  IMPLANT, BILATERAL Bilateral   . COLONOSCOPY    . CORONARY STENT INTERVENTION N/A 08/08/2017    Procedure: CORONARY STENT INTERVENTION;  Surgeon: [name], MD;  Location: [facility];  Service: Cardiovascular;  Laterality: N/A;  . DILATATION & CURRETTAGE/HYSTEROSCOPY WITH RESECTOCOPE N/A 07/06/2013   Procedure: DILATATION & CURETTAGE/HYSTEROSCOPY WITH RESECTOCOPE with removal of endometrial lesion;  Surgeon: [name], MD;  Location: [facility];  Service: Gynecology;  Laterality: N/A;  . LEAD REVISION/REPAIR N/A 10/13/2017   Procedure: LEAD REVISION/REPAIR;  Surgeon: Elizabeth Haw, MD;  Location: [facility];  Service: Cardiovascular;  Laterality: N/A;  . MELANOMA EXCISION  1990s?   from back  . PACEMAKER IMPLANT N/A 08/11/2017   Procedure: PACEMAKER IMPLANT;  Surgeon: Elizabeth Haw, MD;  Location: [facility];  Service: Cardiovascular;  Laterality: N/A;  . RIGHT/LEFT HEART CATH AND CORONARY ANGIOGRAPHY N/A 08/08/2017   Procedure: RIGHT/LEFT HEART CATH AND CORONARY ANGIOGRAPHY;  Surgeon: [name], MD;  Location: [facility];  Service: Cardiovascular;  Laterality: N/A;     Current Meds  Medication Sig  . abatacept (ORENCIA) 250 MG injection Inject 250 mg into the vein every 30 (thirty) days.   Marland Kitchen aspirin 81 MG tablet Take 81 mg by mouth daily.  Marland Kitchen atorvastatin (LIPITOR) 80 MG tablet TAKE 1 TABLET(80 MG) BY MOUTH DAILY AT 6 PM  . cetirizine (ZYRTEC) 10 MG tablet Take 10 mg by mouth daily as needed for allergies.  . cholecalciferol (VITAMIN D) 1000 UNITS tablet Take 1,000 Units by mouth daily.  Marland Kitchen escitalopram (LEXAPRO) 20 MG tablet Take 20 mg by mouth at bedtime.   . fluticasone (FLONASE) 50 MCG/ACT nasal spray Place 1-2 sprays into both nostrils daily as needed for allergies or rhinitis.  Vladimir Faster Glycol-Propyl Glycol (SYSTANE OP) Apply to eye.     Allergies:   Methotrexate derivatives, Plaquenil [hydroxychloroquine sulfate], Remicade [infliximab], Ridaura [auranofin], and Sudafed [pseudoephedrine]   Social History   Tobacco Use   . Smoking status: Never Smoker  . Smokeless tobacco: Never Used  Vaping Use  . Vaping Use: Never used  Substance Use Topics  . Alcohol use: Never  . Drug use: Never     Family Hx: The patient's family history includes CAD (age of onset: 28) in her brother; Coronary artery disease in her mother; Heart disease in her father; Hypertension in her father; Pancreatic cancer in her father.  ROS:   Please see the history of present illness.    All other systems reviewed and are negative.   Prior CV studies:   The following studies were reviewed today:  Event monitor 07/24/17: Study Highlights    Normal sinus rhythm  Periods of intermittent complete heart block and Type 2 second degree AV block with 2:1 conduction   Echo 07/31/17: Study Conclusions  - Left ventricle: The cavity size was normal. Wall thickness was normal. Systolic function was normal. The estimated ejection fraction was in the range of 60% to 65%. Wall motion was  normal; there were no regional wall motion abnormalities. Doppler parameters are consistent with abnormal left ventricular relaxation (grade 1 diastolic dysfunction). - Aortic valve: There was no stenosis. - Mitral valve: The findings are consistent with moderate stenosis. There was no significant regurgitation. Mean gradient (D): 8 mm Hg. Valve area by pressure half-time: 1.49 cm^2. - Left atrium: The atrium was moderately dilated. - Right ventricle: The cavity size was normal. Systolic function was normal. - Tricuspid valve: Peak RV-RA gradient (S): 26 mm Hg. - Pulmonary arteries: PA peak pressure: 29 mm Hg (S). - Inferior vena cava: The vessel was normal in size. The respirophasic diameter changes were in the normal range (>= 50%), consistent with normal central venous pressure. - Pericardium, extracardiac: Small to moderate pericardial effusion. <25% respirophasic variation of mitral E inflow velocity. IVC is not  dilated. No significant RV diastolic collapse. No pericardial tamponade.  Impressions:  - Normal LV size with EF 60-65%. Normal RV size and systolic function. 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 There was a small to moderate pericardial effusion without tamponade.  ETT 08/05/17: Study Highlights     Blood pressure demonstrated a hypertensive response to exercise.  There was no ST segment deviation noted during stress.  Abnormal ETT with poor exercise tolerance (1:01); pt complained of chest discomfort during the study; hypertensive BP response; pt developed 2:1 AV block with exertion (that correlated with chest pain) that resolved in recovery; no ST changes. Pt reviewed with Dr Curt Bears prior to DC.    Cardiac cath/PCI: 08/08/17: Conclusion    80% ostial RCA. The RCA is dominant.  30 to 40% proximal and mid LAD. 70% proximal diagonal #1.  Widely patent circumflex with tortuous obtuse marginals.  Normal left main  Calcified mitral valve and annulus. No significant mitral stenosis is noted.  Mild pulmonary hypertension  Normal left ventricular systolic function with elevated end-diastolic and pulmonary wedge pressure in the 20 to 22 mmHg range consistent with diastolic heart failure, chronic.  Successful PCI and stent of the ostial RCA from 80% with TIMI grade III flow to less than 10% with TIMI grade III flow using a 3.0 x 12 mm Synergy postdilated to 3.5 mmHg pressure.  RECOMMENDATIONS:   Aspirin and Plavix for at least 6 months.  Pacemaker may be needed if continued prolonged episodes of second-degree heart block with bradycardia.  Aggressive risk factor modification.  Probable discharge in a.m. if no complications.      Pacemaker implant 08/11/17: CONCLUSIONS:  1. Successful implantation of a Medtronic Azure XT DR MRI SureScan dual-chamber pacemaker for symptomatic bradycardia 2. No early  apparent complications.   Echo 12/05/17: Study Conclusions  - Left ventricle: The cavity size was normal. Systolic function was vigorous. The estimated ejection fraction was in the range of 65% to 70%. Wall motion was normal; there were no regional wall motion abnormalities. Doppler parameters are consistent with abnormal left ventricular relaxation (grade 1 diastolic dysfunction). Doppler parameters are consistent with elevated ventricular end-diastolic filling pressure. - Mitral valve: Moderately thickened, moderately calcified leaflets . The findings are consistent with moderate stenosis. There was mild regurgitation. Mean gradient (D): 7 mm Hg. Valve area by continuity equation (using LVOT flow): 1.17 cm^2. - Left atrium: The atrium was mildly dilated. - Right ventricle: Systolic function was normal. - Right atrium: The atrium was mildly dilated. - Tricuspid valve: There was mild regurgitation. - Pericardium, extracardiac: A mild to moderate circumferential pericardial effusion was identified. Features were not consistent with  tamponade physiology.  Impressions:  - No significant change since the prior study on 07/31/2017.    Labs/Other Tests and Data Reviewed:    EKG:  No ECG reviewed.  Recent Labs: No results found for requested labs within last 8760 hours.   Recent Lipid Panel Lab Results  Component Value Date/Time   CHOL 147 09/23/2018 11:57 AM   TRIG 44 09/23/2018 11:57 AM   HDL 73 09/23/2018 11:57 AM   CHOLHDL 2.0 09/23/2018 11:57 AM   LDLCALC 65 09/23/2018 11:57 AM   Dated 01/07/17: cholesterol 147, triglycerides 37, HDL 75, LDL 65. CMET and CBC normal.  Wt Readings from Last 3 Encounters:  10/13/19 107 lb (48.5 kg)  09/21/18 112 lb (50.8 kg)  01/20/18 116 lb 9.6 oz (52.9 kg)     Objective:    Vital Signs:  BP 117/62   Pulse 64   Ht 5' 2.5" (1.588 m)   Wt 107 lb (48.5 kg)   LMP 04/15/1990   BMI 19.26 kg/m    VITAL  SIGNS:  reviewed    ASSESSMENT & PLAN:     1. CAD. S/p DES of ostial RCA on 08/08/17.  Continue statin and ASA. Encouraged more aerobic exercise. Will treat residual disease in a diagonal branch medically.  2. CHB s/p DDD pacemaker insertion on 08/11/17. Follow up with EP 3. Mild-moderate  Mitral stenosis on Echo. Right heart cath without significant stenosis/vavle gradient 4. Diastolic dysfunction- asymptomatic.  5. RA. Follow up with  Dr. Amil Amen. On [place] 6. HLD will check fasting lab work 7. Modest pericardial effusion chronic.   COVID-19 Education: The signs and symptoms of COVID-19 were discussed with the patient and how to seek care for testing (follow up with PCP or arrange E-visit).  The importance of social distancing was discussed today.  Time:   Today, I have spent 15 minutes with the patient with telehealth technology discussing the above problems.     Medication Adjustments/Labs and Tests Ordered: Current medicines are reviewed at length with the patient today.  Concerns regarding medicines are outlined above.   Tests Ordered: No orders of the defined types were placed in this encounter.   Medication Changes: No orders of the defined types were placed in this encounter.   Disposition:  Follow up in 1 year(s)  Signed, Akaya Proffit Martinique, MD  10/13/2019 9:29 AM    [facility]

## 2019-10-13 NOTE — Telephone Encounter (Signed)
Returned call to patient's sister Blanch Media no answer.[place].

## 2019-10-14 LAB — HEPATIC FUNCTION PANEL
ALT: 20 IU/L (ref 0–32)
AST: 36 IU/L (ref 0–40)
Albumin: 3.9 g/dL (ref 3.7–4.7)
Alkaline Phosphatase: 82 IU/L (ref 48–121)
Bilirubin Total: 1.3 mg/dL — ABNORMAL HIGH (ref 0.0–1.2)
Bilirubin, Direct: 0.34 mg/dL (ref 0.00–0.40)
Total Protein: 6.4 g/dL (ref 6.0–8.5)

## 2019-10-14 LAB — BASIC METABOLIC PANEL
BUN/Creatinine Ratio: 17 (ref 12–28)
BUN: 15 mg/dL (ref 8–27)
CO2: 26 mmol/L (ref 20–29)
Calcium: 9.1 mg/dL (ref 8.7–10.3)
Chloride: 103 mmol/L (ref 96–106)
Creatinine, Ser: 0.87 mg/dL (ref 0.57–1.00)
GFR calc Af Amer: 74 mL/min/{1.73_m2} (ref >59)
GFR calc non Af Amer: 64 mL/min/{1.73_m2} (ref >59)
Glucose: 86 mg/dL (ref 65–99)
Potassium: 4.4 mmol/L (ref 3.5–5.2)
Sodium: 139 mmol/L (ref 134–144)

## 2019-10-14 LAB — LIPID PANEL
Chol/HDL Ratio: 1.8 ratio (ref 0.0–4.4)
Cholesterol, Total: 147 mg/dL (ref 100–199)
HDL: 82 mg/dL (ref >39)
LDL Chol Calc (NIH): 56 mg/dL (ref 0–99)
Triglycerides: 39 mg/dL (ref 0–149)
VLDL Cholesterol Cal: 9 mg/dL (ref 5–40)

## 2019-10-19 ENCOUNTER — Ambulatory Visit (INDEPENDENT_AMBULATORY_CARE_PROVIDER_SITE_OTHER): Payer: Medicare Other | Admitting: *Deleted

## 2019-10-19 DIAGNOSIS — I441 Atrioventricular block, second degree: Secondary | ICD-10-CM

## 2019-10-19 LAB — CUP PACEART REMOTE DEVICE CHECK
Battery Remaining Longevity: 108 mo
Battery Voltage: 3 V
Brady Statistic AP VP Percent: 42 %
Brady Statistic AP VS Percent: 0.01 %
Brady Statistic AS VP Percent: 57.97 %
Brady Statistic AS VS Percent: 0.02 %
Brady Statistic RA Percent Paced: 41.95 %
Brady Statistic RV Percent Paced: 99.97 %
Date Time Interrogation Session: 20210705231600
Implantable Lead Implant Date: 20190429
Implantable Lead Implant Date: 20190429
Implantable Lead Location: 753859
Implantable Lead Location: 753860
Implantable Lead Model: 5076
Implantable Lead Model: 5076
Implantable Pulse Generator Implant Date: 20190429
Lead Channel Impedance Value: 342 Ohm
Lead Channel Impedance Value: 342 Ohm
Lead Channel Impedance Value: 418 Ohm
Lead Channel Impedance Value: 494 Ohm
Lead Channel Pacing Threshold Amplitude: 0.625 V
Lead Channel Pacing Threshold Amplitude: 0.625 V
Lead Channel Pacing Threshold Pulse Width: 0.4 ms
Lead Channel Pacing Threshold Pulse Width: 0.4 ms
Lead Channel Sensing Intrinsic Amplitude: 11.25 mV
Lead Channel Sensing Intrinsic Amplitude: 11.25 mV
Lead Channel Sensing Intrinsic Amplitude: 2.125 mV
Lead Channel Sensing Intrinsic Amplitude: 2.125 mV
Lead Channel Setting Pacing Amplitude: 1.5 V
Lead Channel Setting Pacing Amplitude: 2.5 V
Lead Channel Setting Pacing Pulse Width: 0.4 ms
Lead Channel Setting Sensing Sensitivity: 1.2 mV

## 2019-10-20 NOTE — Progress Notes (Signed)
Remote pacemaker transmission.   

## 2019-10-21 ENCOUNTER — Telehealth: Payer: Self-pay | Admitting: Cardiology

## 2019-10-21 NOTE — Telephone Encounter (Signed)
Called patient, gave lab results.  Patient verbalized understanding. 

## 2019-10-21 NOTE — Telephone Encounter (Signed)
Elizabeth Estrada is calling requesting a nurse call her with her lab results.

## 2019-10-26 ENCOUNTER — Ambulatory Visit (INDEPENDENT_AMBULATORY_CARE_PROVIDER_SITE_OTHER): Payer: Medicare Other | Admitting: Cardiology

## 2019-10-26 ENCOUNTER — Encounter: Payer: Self-pay | Admitting: Cardiology

## 2019-10-26 ENCOUNTER — Other Ambulatory Visit: Payer: Self-pay

## 2019-10-26 VITALS — BP 124/68 | HR 65 | Ht 62.5 in | Wt 107.0 lb

## 2019-10-26 DIAGNOSIS — I442 Atrioventricular block, complete: Secondary | ICD-10-CM | POA: Diagnosis not present

## 2019-10-26 DIAGNOSIS — I2510 Atherosclerotic heart disease of native coronary artery without angina pectoris: Secondary | ICD-10-CM | POA: Diagnosis not present

## 2019-10-26 DIAGNOSIS — Z9861 Coronary angioplasty status: Secondary | ICD-10-CM | POA: Diagnosis not present

## 2019-10-26 NOTE — Progress Notes (Signed)
Electrophysiology Office Note   Date:  10/26/2019   ID:  Elizabeth Estrada, DOB 06-Feb-1941, MRN [id number]  PCP:  [name], MD  Cardiologist:  [place] Primary Electrophysiologist:  [name], MD    No chief complaint on file.    History of Present Illness: Elizabeth Estrada is a 79 y.o. female who is being seen today for the evaluation of complete heart block at the request of [name], MD. Presenting today for electrophysiology evaluation.  She has a history of coronary artery disease, hypertension, complete and complete heart block status post Medtronic dual-chamber pacemaker implanted April 2019.  She had a his lead implanted with a rising threshold.  Fortunately, she is now conducting. She had a lead revision for rising thresholds on 10/13/17.  Today, denies symptoms of palpitations, chest pain, shortness of breath, orthopnea, PND, lower extremity edema, claudication, dizziness, presyncope, syncope, bleeding, or neurologic sequela. The patient is tolerating medications without difficulties.  She currently feels well.  She has no chest pain or shortness of breath.  She is able to do all of her daily activities.  She is unaware of her pacemaker.  Past Medical History:  Diagnosis Date  . 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 Chronic depression   . Coronary artery disease   . Family history of adverse reaction to anesthesia    "dad had PONV"  . Fibromyalgia   . GERD (gastroesophageal reflux disease)   . Heart murmur   . History of kidney stones   . Hypertension    "before stent and pacemaker" (10/13/2017)  . Melanoma ([place])    removed from back  . MVP (mitral valve prolapse)   . Osteoarthritis   . Osteoporosis   . 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 RA (rheumatoid arthritis) ([place])    "all over; mainly hands and knees" (10/13/2017)  . Rib fracture    Past Surgical History:  Procedure Laterality Date  . BREAST  BIOPSY Left 11/2003   benign - Dr. Margot Chimes  . CARDIAC CATHETERIZATION    . CARDIOVASCULAR STRESS TEST  10/19/1999, 2013   EF 97%, NO EVIDENCE OF ISCHEMIA, 2013 NORMAL  . CATARACT EXTRACTION W/ INTRAOCULAR LENS  IMPLANT, BILATERAL Bilateral   . COLONOSCOPY    . CORONARY STENT INTERVENTION N/A 08/08/2017   Procedure: CORONARY STENT INTERVENTION;  Surgeon: [name], MD;  Location: [facility];  Service: Cardiovascular;  Laterality: N/A;  . DILATATION & CURRETTAGE/HYSTEROSCOPY WITH RESECTOCOPE N/A 07/06/2013   Procedure: DILATATION & CURETTAGE/HYSTEROSCOPY WITH RESECTOCOPE with removal of endometrial lesion;  Surgeon: [name], MD;  Location: [facility];  Service: Gynecology;  Laterality: N/A;  . LEAD REVISION/REPAIR N/A 10/13/2017   Procedure: LEAD REVISION/REPAIR;  Surgeon: [name], MD;  Location: [facility];  Service: Cardiovascular;  Laterality: N/A;  . MELANOMA EXCISION  1990s?   from back  . PACEMAKER IMPLANT N/A 08/11/2017   Procedure: PACEMAKER IMPLANT;  Surgeon: [name], MD;  Location: [facility];  Service: Cardiovascular;  Laterality: N/A;  . RIGHT/LEFT HEART CATH AND CORONARY ANGIOGRAPHY N/A 08/08/2017   Procedure: RIGHT/LEFT HEART CATH AND CORONARY ANGIOGRAPHY;  Surgeon: [name], MD;  Location: [facility];  Service: Cardiovascular;  Laterality: N/A;     Current Outpatient Medications  Medication Sig Dispense Refill  . abatacept (ORENCIA) 250 MG injection Inject 250 mg into the vein every 30 (thirty)  days.     . aspirin 81 MG tablet Take 81 mg by mouth daily.    Marland Kitchen atorvastatin (LIPITOR) 80 MG tablet TAKE 1 TABLET(80 MG) BY MOUTH DAILY AT 6 PM 30 tablet 11  . cetirizine (ZYRTEC) 10 MG tablet Take 10 mg by mouth daily as needed for allergies.    . cholecalciferol (VITAMIN D) 1000 UNITS tablet Take 1,000 Units by mouth daily.    Marland Kitchen escitalopram (LEXAPRO) 20 MG tablet Take 20 mg by mouth at bedtime.     . fluticasone  (FLONASE) 50 MCG/ACT nasal spray Place 1-2 sprays into both nostrils daily as needed for allergies or rhinitis.    Vladimir Faster Glycol-Propyl Glycol (SYSTANE OP) Apply to eye.     No current facility-administered medications for this visit.    Allergies:   Methotrexate derivatives, Plaquenil [hydroxychloroquine sulfate], Remicade [infliximab], Ridaura [auranofin], and Sudafed [pseudoephedrine]   Social History:  The patient  reports that she has never smoked. She has never used smokeless tobacco. She reports that she does not drink alcohol and does not use drugs.   Family History:  The patient's family history includes CAD (age of onset: 28) in her brother; Coronary artery disease in her mother; Heart disease in her father; Hypertension in her father; Pancreatic cancer in her father.   ROS:  Please see the history of present illness.   Otherwise, review of systems is positive for none.   All other systems are reviewed and negative.   PHYSICAL EXAM: VS:  BP 124/68   Pulse 65   Ht 5' 2.5" (1.588 m)   Wt 107 lb (48.5 kg)   LMP 04/15/1990   SpO2 97%   BMI 19.26 kg/m  , BMI Body mass index is 19.26 kg/m. GEN: Well nourished, well developed, in no acute distress  HEENT: normal  Neck: no JVD, carotid bruits, or masses Cardiac: RRR; no murmurs, rubs, or gallops,no edema  Respiratory:  clear to auscultation bilaterally, normal work of breathing GI: soft, nontender, nondistended, + BS MS: no deformity or atrophy  Skin: warm and dry, device site well healed Neuro:  Strength and sensation are intact Psych: euthymic mood, full affect  EKG:  EKG is ordered today. Personal review of the ekg ordered shows sinus rhythm, ventricular paced  Personal review of the device interrogation today. Results in [place]: 10/14/2019: ALT 20; BUN 15; Creatinine, Ser 0.87; Potassium 4.4; Sodium 139    Lipid Panel     Component Value Date/Time   CHOL 147 10/14/2019 1144   TRIG 39 10/14/2019  1144   HDL 82 10/14/2019 1144   CHOLHDL 1.8 10/14/2019 1144   LDLCALC 56 10/14/2019 1144     Wt Readings from Last 3 Encounters:  10/26/19 107 lb (48.5 kg)  10/13/19 107 lb (48.5 kg)  09/21/18 112 lb (50.8 kg)      Other studies Reviewed: Additional studies/ records that were reviewed today include: TTE 12/05/17 Review of the above records today demonstrates:  - Left ventricle: The cavity size was normal. Systolic function was   vigorous. The estimated ejection fraction was in the range of 65%   to 70%. Wall motion was normal; there were no regional wall   motion abnormalities. Doppler parameters are consistent with   abnormal left ventricular relaxation (grade 1 diastolic   dysfunction). Doppler parameters are consistent with elevated   ventricular end-diastolic filling pressure. - Mitral valve: Moderately thickened, moderately calcified leaflets   . The findings are consistent  with moderate stenosis. There was   mild regurgitation. Mean gradient (D): 7 mm Hg. Valve area by   continuity equation (using LVOT flow): 1.17 cm^2. - Left atrium: The atrium was mildly dilated. - Right ventricle: Systolic function was normal. - Right atrium: The atrium was mildly dilated. - Tricuspid valve: There was mild regurgitation. - Pericardium, extracardiac: A mild to moderate circumferential   pericardial effusion was identified. Features were not consistent   with tamponade physiology.   LHC/RHC 08/08/17  80% ostial RCA.  The RCA is dominant.  30 to 40% proximal and mid LAD.  70% proximal diagonal #1.  Widely patent circumflex with tortuous obtuse marginals.  Normal left main  Calcified mitral valve and annulus.  No significant mitral stenosis is noted.  Mild pulmonary hypertension  Normal left ventricular systolic function with elevated end-diastolic and pulmonary wedge pressure in the 20 to 22 mmHg range consistent with diastolic heart failure, chronic.  Successful PCI and stent  of the ostial RCA from 80% with TIMI grade III flow to less than 10% with TIMI grade III flow using a 3.0 x 12 mm Synergy postdilated to 3.5 mmHg pressure.   ASSESSMENT AND PLAN:  1.  Complete heart block: Status post Medtronic dual-chamber pacemaker implanted 08/11/2017.  Lead revision due to increasing thresholds 10/13/2017.  Device functioning appropriately.  No changes.  2.  Coronary artery disease: Status post RCA stent in 2019.  Currently on aspirin and Plavix.  No current chest pain.    3.  Atrial tachycardia: Minimal noted on pacemaker interrogation  Current medicines are reviewed at length with the patient today.   The patient does not have concerns regarding her medicines.  The following changes were made today: None  Labs/ tests ordered today include:  Orders Placed This Encounter  Procedures  . EKG 12-Lead     Disposition:   FU with Elyna Pangilinan 12 months  Signed, Sundance Moise Meredith Leeds, MD  10/26/2019 2:41 PM     [facility] [address] [place] [address] [phone] (office) [phone] (fax)

## 2019-10-26 NOTE — Patient Instructions (Signed)
Medication Instructions:  Your physician recommends that you continue on your current medications as directed. Please refer to the Current Medication list given to you today.  *If you need a refill on your cardiac medications before your next appointment, please call your pharmacy*   Lab Work: None ordered If you have labs (blood work) drawn today and your tests are completely normal, you will receive your results only by: . MyChart Message (if you have MyChart) OR . A paper copy in the mail If you have any lab test that is abnormal or we need to change your treatment, we will call you to review the results.   Testing/Procedures: None ordered   Follow-Up: At [facility], you and your health needs are our priority.  As part of our continuing mission to provide you with exceptional heart care, we have created designated Provider Care Teams.  These Care Teams include your primary Cardiologist (physician) and Advanced Practice Providers (APPs -  Physician Assistants and Nurse Practitioners) who all work together to provide you with the care you need, when you need it.  We recommend signing up for the patient portal called "MyChart".  Sign up information is provided on this After Visit Summary.  MyChart is used to connect with patients for Virtual Visits (Telemedicine).  Patients are able to view lab/test results, encounter notes, upcoming appointments, etc.  Non-urgent messages can be sent to your provider as well.   To learn more about what you can do with MyChart, go to [email or web address].    Your next appointment:   1 year(s)  The format for your next appointment:   In Person  Provider:   Will Camnitz, MD   Thank you for choosing [facility]!!   Lailynn Southgate, RN [phone]    Other Instructions    

## 2019-11-17 DIAGNOSIS — M0579 Rheumatoid arthritis with rheumatoid factor of multiple sites without organ or systems involvement: Secondary | ICD-10-CM | POA: Diagnosis not present

## 2019-11-22 DIAGNOSIS — T148XXA Other injury of unspecified body region, initial encounter: Secondary | ICD-10-CM | POA: Diagnosis not present

## 2019-11-22 DIAGNOSIS — S91332A Puncture wound without foreign body, left foot, initial encounter: Secondary | ICD-10-CM | POA: Diagnosis not present

## 2019-12-09 ENCOUNTER — Other Ambulatory Visit: Payer: Self-pay | Admitting: Cardiology

## 2019-12-09 DIAGNOSIS — Z23 Encounter for immunization: Secondary | ICD-10-CM | POA: Diagnosis not present

## 2019-12-22 DIAGNOSIS — M0579 Rheumatoid arthritis with rheumatoid factor of multiple sites without organ or systems involvement: Secondary | ICD-10-CM | POA: Diagnosis not present

## 2019-12-27 DIAGNOSIS — Z1231 Encounter for screening mammogram for malignant neoplasm of breast: Secondary | ICD-10-CM | POA: Diagnosis not present

## 2020-01-08 DIAGNOSIS — Z23 Encounter for immunization: Secondary | ICD-10-CM | POA: Diagnosis not present

## 2020-01-10 DIAGNOSIS — H40023 Open angle with borderline findings, high risk, bilateral: Secondary | ICD-10-CM | POA: Diagnosis not present

## 2020-01-10 DIAGNOSIS — H40053 Ocular hypertension, bilateral: Secondary | ICD-10-CM | POA: Diagnosis not present

## 2020-01-10 DIAGNOSIS — H04123 Dry eye syndrome of bilateral lacrimal glands: Secondary | ICD-10-CM | POA: Diagnosis not present

## 2020-01-10 DIAGNOSIS — H0102A Squamous blepharitis right eye, upper and lower eyelids: Secondary | ICD-10-CM | POA: Diagnosis not present

## 2020-01-10 DIAGNOSIS — H0102B Squamous blepharitis left eye, upper and lower eyelids: Secondary | ICD-10-CM | POA: Diagnosis not present

## 2020-01-12 ENCOUNTER — Encounter: Payer: Self-pay | Admitting: Obstetrics and Gynecology

## 2020-01-13 DIAGNOSIS — L853 Xerosis cutis: Secondary | ICD-10-CM | POA: Diagnosis not present

## 2020-01-13 DIAGNOSIS — L821 Other seborrheic keratosis: Secondary | ICD-10-CM | POA: Diagnosis not present

## 2020-01-13 DIAGNOSIS — D225 Melanocytic nevi of trunk: Secondary | ICD-10-CM | POA: Diagnosis not present

## 2020-01-13 DIAGNOSIS — D692 Other nonthrombocytopenic purpura: Secondary | ICD-10-CM | POA: Diagnosis not present

## 2020-01-13 DIAGNOSIS — D1801 Hemangioma of skin and subcutaneous tissue: Secondary | ICD-10-CM | POA: Diagnosis not present

## 2020-01-13 DIAGNOSIS — L814 Other melanin hyperpigmentation: Secondary | ICD-10-CM | POA: Diagnosis not present

## 2020-01-13 DIAGNOSIS — Z85820 Personal history of malignant melanoma of skin: Secondary | ICD-10-CM | POA: Diagnosis not present

## 2020-01-13 DIAGNOSIS — D2262 Melanocytic nevi of left upper limb, including shoulder: Secondary | ICD-10-CM | POA: Diagnosis not present

## 2020-01-13 DIAGNOSIS — D2272 Melanocytic nevi of left lower limb, including hip: Secondary | ICD-10-CM | POA: Diagnosis not present

## 2020-01-13 DIAGNOSIS — L570 Actinic keratosis: Secondary | ICD-10-CM | POA: Diagnosis not present

## 2020-01-16 IMAGING — CR DG CHEST 2V
2 series · 2 of 2 positions shown · non-contrast
Comparison: 08/28/2017

CLINICAL DATA: Post pacemaker

EXAM:
CHEST - 2 VIEW

[chest pa]
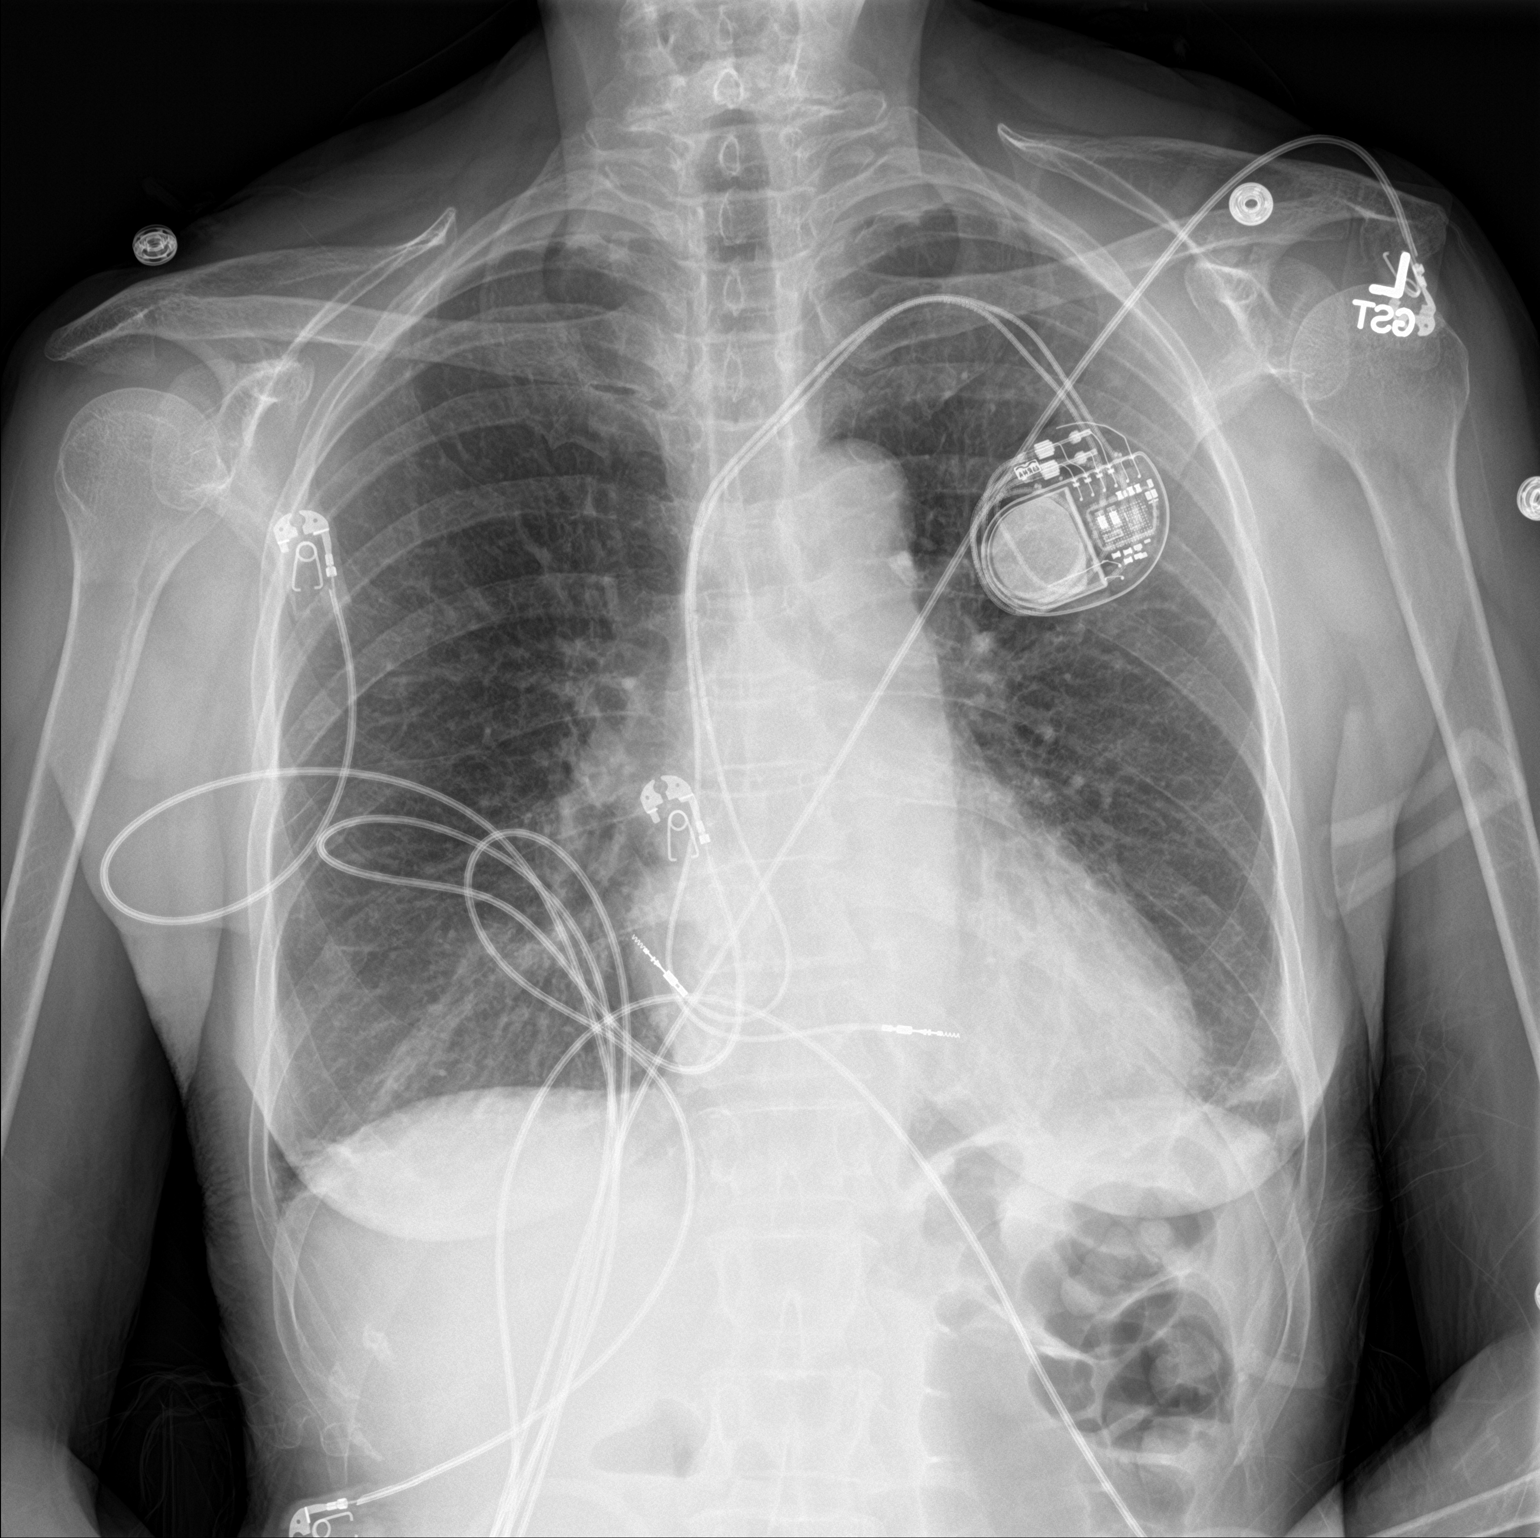

[chest lat]
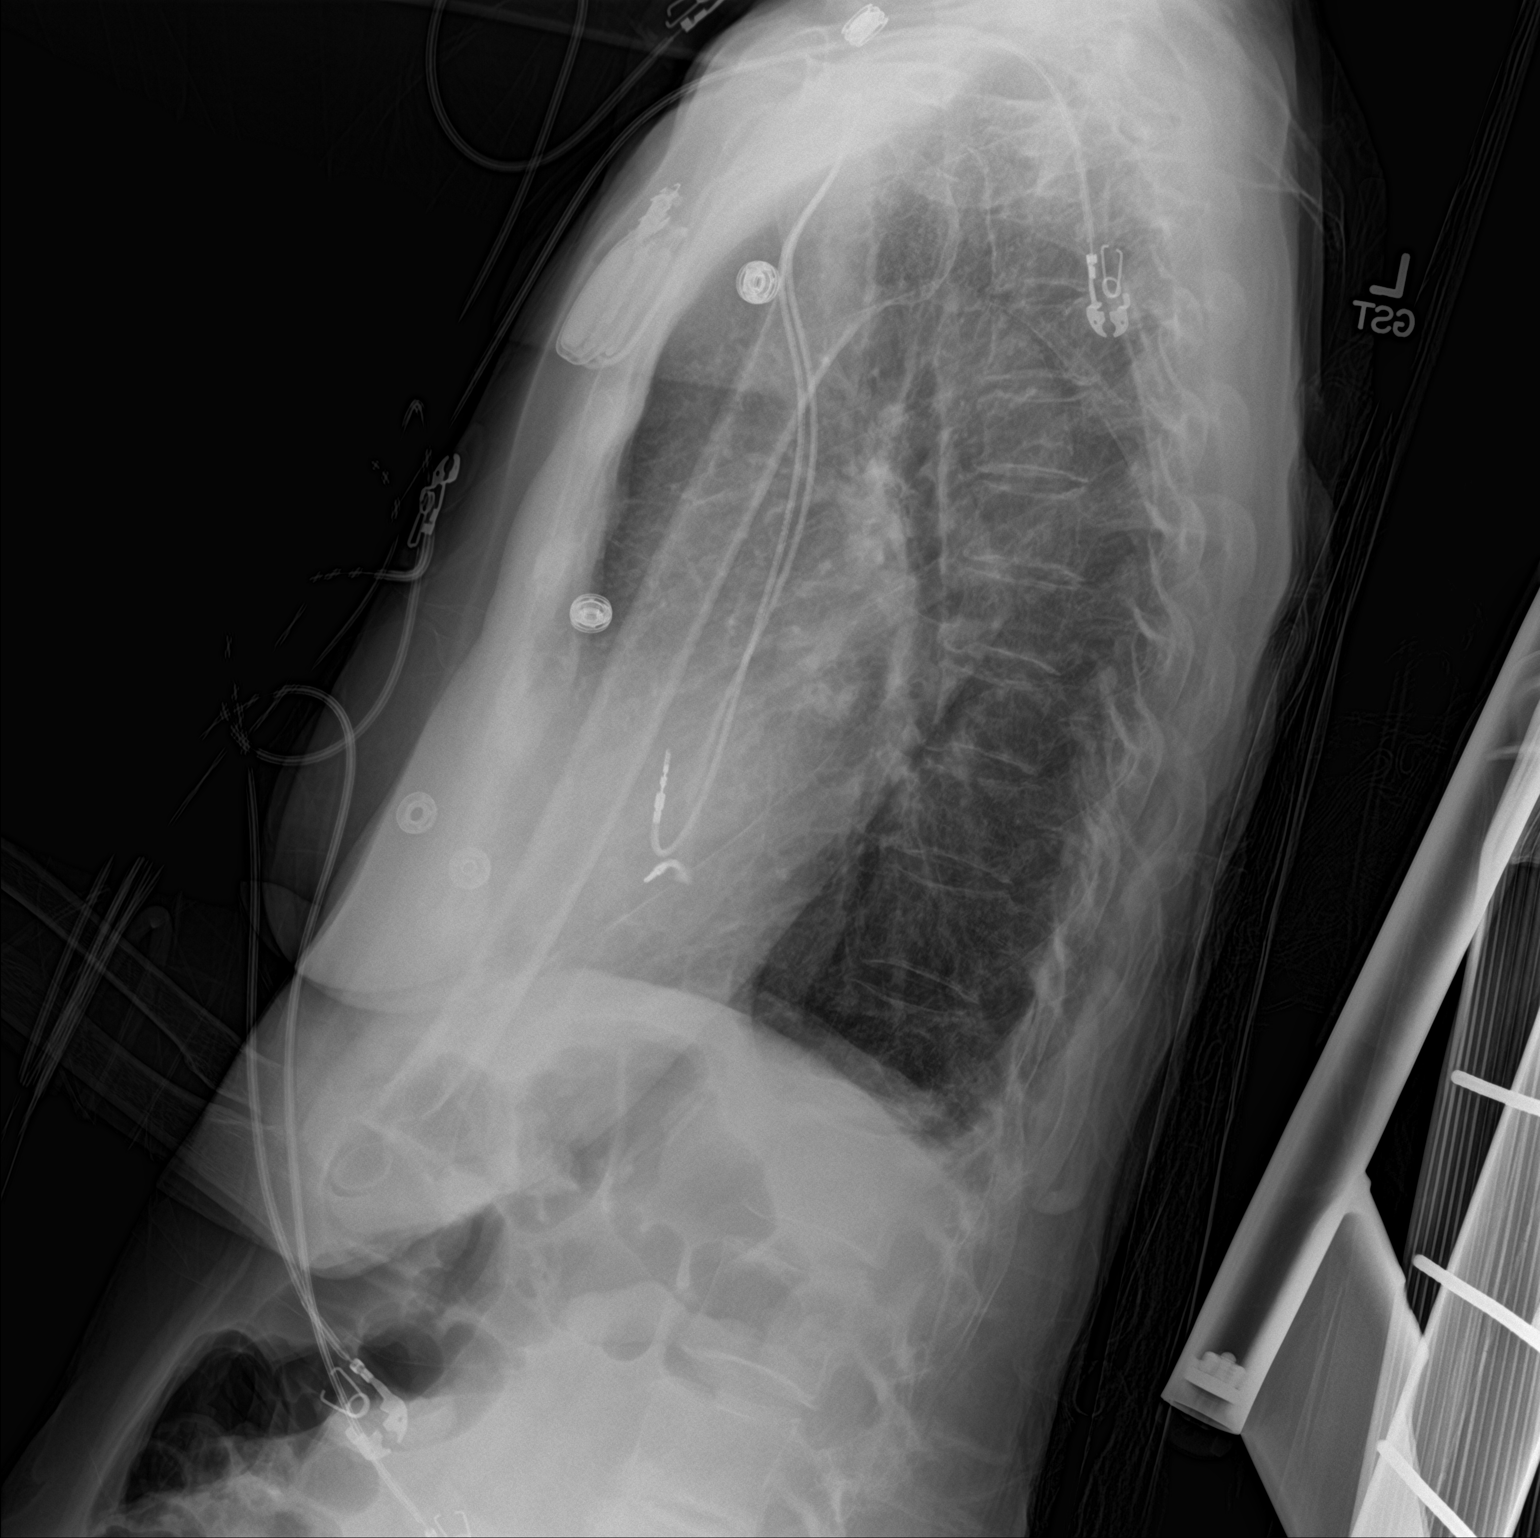

[2 of 2 positions shown; findings below may reference images not displayed]

FINDINGS: Left pacer remains in place, unchanged. Bibasilar atelectasis. No
effusions or pneumothorax. Heart is upper limits normal in size. No
acute bony abnormality.
IMPRESSION: Left pacer is unchanged.

Bibasilar atelectasis.

No pneumothorax.

## 2020-01-18 ENCOUNTER — Ambulatory Visit (INDEPENDENT_AMBULATORY_CARE_PROVIDER_SITE_OTHER): Payer: Medicare Other

## 2020-01-18 DIAGNOSIS — I441 Atrioventricular block, second degree: Secondary | ICD-10-CM | POA: Diagnosis not present

## 2020-01-18 LAB — CUP PACEART REMOTE DEVICE CHECK
Battery Remaining Longevity: 102 mo
Battery Voltage: 2.99 V
Brady Statistic AP VP Percent: 36.91 %
Brady Statistic AP VS Percent: 0 %
Brady Statistic AS VP Percent: 63.08 %
Brady Statistic AS VS Percent: 0.01 %
Brady Statistic RA Percent Paced: 36.85 %
Brady Statistic RV Percent Paced: 99.99 %
Date Time Interrogation Session: 20211004221328
Implantable Lead Implant Date: 20190429
Implantable Lead Implant Date: 20190429
Implantable Lead Location: 753859
Implantable Lead Location: 753860
Implantable Lead Model: 5076
Implantable Lead Model: 5076
Implantable Pulse Generator Implant Date: 20190429
Lead Channel Impedance Value: 342 Ohm
Lead Channel Impedance Value: 342 Ohm
Lead Channel Impedance Value: 380 Ohm
Lead Channel Impedance Value: 475 Ohm
Lead Channel Pacing Threshold Amplitude: 0.5 V
Lead Channel Pacing Threshold Amplitude: 0.5 V
Lead Channel Pacing Threshold Pulse Width: 0.4 ms
Lead Channel Pacing Threshold Pulse Width: 0.4 ms
Lead Channel Sensing Intrinsic Amplitude: 11.25 mV
Lead Channel Sensing Intrinsic Amplitude: 11.25 mV
Lead Channel Sensing Intrinsic Amplitude: 2.625 mV
Lead Channel Sensing Intrinsic Amplitude: 2.625 mV
Lead Channel Setting Pacing Amplitude: 1.5 V
Lead Channel Setting Pacing Amplitude: 2.5 V
Lead Channel Setting Pacing Pulse Width: 0.4 ms
Lead Channel Setting Sensing Sensitivity: 1.2 mV

## 2020-01-19 DIAGNOSIS — M0579 Rheumatoid arthritis with rheumatoid factor of multiple sites without organ or systems involvement: Secondary | ICD-10-CM | POA: Diagnosis not present

## 2020-01-21 NOTE — Progress Notes (Signed)
Remote pacemaker transmission.   

## 2020-01-25 DIAGNOSIS — Z0000 Encounter for general adult medical examination without abnormal findings: Secondary | ICD-10-CM | POA: Diagnosis not present

## 2020-01-25 DIAGNOSIS — I442 Atrioventricular block, complete: Secondary | ICD-10-CM | POA: Diagnosis not present

## 2020-01-25 DIAGNOSIS — M1990 Unspecified osteoarthritis, unspecified site: Secondary | ICD-10-CM | POA: Diagnosis not present

## 2020-01-25 DIAGNOSIS — M069 Rheumatoid arthritis, unspecified: Secondary | ICD-10-CM | POA: Diagnosis not present

## 2020-01-25 DIAGNOSIS — K219 Gastro-esophageal reflux disease without esophagitis: Secondary | ICD-10-CM | POA: Diagnosis not present

## 2020-01-25 DIAGNOSIS — F3340 Major depressive disorder, recurrent, in remission, unspecified: Secondary | ICD-10-CM | POA: Diagnosis not present

## 2020-01-25 DIAGNOSIS — Z950 Presence of cardiac pacemaker: Secondary | ICD-10-CM | POA: Diagnosis not present

## 2020-01-25 DIAGNOSIS — E7800 Pure hypercholesterolemia, unspecified: Secondary | ICD-10-CM | POA: Diagnosis not present

## 2020-01-25 DIAGNOSIS — I208 Other forms of angina pectoris: Secondary | ICD-10-CM | POA: Diagnosis not present

## 2020-01-25 DIAGNOSIS — I25119 Atherosclerotic heart disease of native coronary artery with unspecified angina pectoris: Secondary | ICD-10-CM | POA: Diagnosis not present

## 2020-01-25 DIAGNOSIS — R5383 Other fatigue: Secondary | ICD-10-CM | POA: Diagnosis not present

## 2020-02-09 DIAGNOSIS — N200 Calculus of kidney: Secondary | ICD-10-CM | POA: Diagnosis not present

## 2020-02-16 DIAGNOSIS — R5383 Other fatigue: Secondary | ICD-10-CM | POA: Diagnosis not present

## 2020-02-16 DIAGNOSIS — M0579 Rheumatoid arthritis with rheumatoid factor of multiple sites without organ or systems involvement: Secondary | ICD-10-CM | POA: Diagnosis not present

## 2020-02-16 DIAGNOSIS — Z79899 Other long term (current) drug therapy: Secondary | ICD-10-CM | POA: Diagnosis not present

## 2020-02-16 DIAGNOSIS — Z111 Encounter for screening for respiratory tuberculosis: Secondary | ICD-10-CM | POA: Diagnosis not present

## 2020-02-29 DIAGNOSIS — N200 Calculus of kidney: Secondary | ICD-10-CM | POA: Diagnosis not present

## 2020-02-29 DIAGNOSIS — R102 Pelvic and perineal pain: Secondary | ICD-10-CM | POA: Diagnosis not present

## 2020-03-15 DIAGNOSIS — M0579 Rheumatoid arthritis with rheumatoid factor of multiple sites without organ or systems involvement: Secondary | ICD-10-CM | POA: Diagnosis not present

## 2020-03-29 DIAGNOSIS — H40013 Open angle with borderline findings, low risk, bilateral: Secondary | ICD-10-CM | POA: Diagnosis not present

## 2020-03-29 DIAGNOSIS — H0102A Squamous blepharitis right eye, upper and lower eyelids: Secondary | ICD-10-CM | POA: Diagnosis not present

## 2020-03-29 DIAGNOSIS — H04123 Dry eye syndrome of bilateral lacrimal glands: Secondary | ICD-10-CM | POA: Diagnosis not present

## 2020-03-29 DIAGNOSIS — H0102B Squamous blepharitis left eye, upper and lower eyelids: Secondary | ICD-10-CM | POA: Diagnosis not present

## 2020-03-29 DIAGNOSIS — H40053 Ocular hypertension, bilateral: Secondary | ICD-10-CM | POA: Diagnosis not present

## 2020-04-12 DIAGNOSIS — M0579 Rheumatoid arthritis with rheumatoid factor of multiple sites without organ or systems involvement: Secondary | ICD-10-CM | POA: Diagnosis not present

## 2020-04-17 ENCOUNTER — Telehealth: Payer: Self-pay | Admitting: Cardiology

## 2020-04-17 NOTE — Telephone Encounter (Signed)
Spoke with patient and she has been having issues with being dizzy later in the evening, not around anything specific. She does not feel this brought on by movement or going from sitting to standing position.  Episodes last 2-3 minutes and happen couple times a day  Also having issues of heart feeling like it is beating fast. These episodes are also in evening and notices after she has been up doing something. Takes about 15 minutes to subside These do not happen together.  No way of checking her blood pressure or HR at home Patient is scheduled for pacer check tomorrow Will forward to device clinic so they will be aware and Dr [place] for review

## 2020-04-17 NOTE — Telephone Encounter (Signed)
STAT if patient feels like he/she is going to faint   1) Are you dizzy now? no  2) Do you feel faint or have you passed out? no  3) Do you have any other symptoms? Heart feels like it is pounding sometimes  4) Have you checked your HR and BP (record if available)? Has not checked   Patient states she has been having dizziness for the past 2 to 3 weeks. She states it is not all the time, but happens more as the day goes on. She states she also gets a pounding heart rate, but does not have any BP or HR readings. She states she did go to the rheumatologist last week and her BP was a little high.

## 2020-04-18 ENCOUNTER — Ambulatory Visit (INDEPENDENT_AMBULATORY_CARE_PROVIDER_SITE_OTHER): Payer: Medicare Other

## 2020-04-18 DIAGNOSIS — I441 Atrioventricular block, second degree: Secondary | ICD-10-CM | POA: Diagnosis not present

## 2020-04-18 DIAGNOSIS — Z20828 Contact with and (suspected) exposure to other viral communicable diseases: Secondary | ICD-10-CM | POA: Diagnosis not present

## 2020-04-18 LAB — CUP PACEART REMOTE DEVICE CHECK
Battery Remaining Longevity: 100 mo
Battery Voltage: 2.99 V
Brady Statistic AP VP Percent: 23.2 %
Brady Statistic AP VS Percent: 0.26 %
Brady Statistic AS VP Percent: 76.03 %
Brady Statistic AS VS Percent: 0.5 %
Brady Statistic RA Percent Paced: 23.35 %
Brady Statistic RV Percent Paced: 99.24 %
Date Time Interrogation Session: 20220104064021
Implantable Lead Implant Date: 20190429
Implantable Lead Implant Date: 20190429
Implantable Lead Location: 753859
Implantable Lead Location: 753860
Implantable Lead Model: 5076
Implantable Lead Model: 5076
Implantable Pulse Generator Implant Date: 20190429
Lead Channel Impedance Value: 342 Ohm
Lead Channel Impedance Value: 342 Ohm
Lead Channel Impedance Value: 399 Ohm
Lead Channel Impedance Value: 456 Ohm
Lead Channel Pacing Threshold Amplitude: 0.625 V
Lead Channel Pacing Threshold Amplitude: 0.75 V
Lead Channel Pacing Threshold Pulse Width: 0.4 ms
Lead Channel Pacing Threshold Pulse Width: 0.4 ms
Lead Channel Sensing Intrinsic Amplitude: 17.75 mV
Lead Channel Sensing Intrinsic Amplitude: 17.75 mV
Lead Channel Sensing Intrinsic Amplitude: 2.625 mV
Lead Channel Sensing Intrinsic Amplitude: 2.625 mV
Lead Channel Setting Pacing Amplitude: 1.5 V
Lead Channel Setting Pacing Amplitude: 2.5 V
Lead Channel Setting Pacing Pulse Width: 0.4 ms
Lead Channel Setting Sensing Sensitivity: 1.2 mV

## 2020-04-18 NOTE — Telephone Encounter (Signed)
Spoke to patient Dr.Jordan's advice given.Appointment scheduled with Dr.Jordan 04/21/20 at 2:00 pm.

## 2020-04-18 NOTE — Telephone Encounter (Signed)
It would be simple enough to check her device but doubt this is an arrhythmia. Tracking her BP during these episodes would be helpful. Dizziness could be related to a number of issues. If her symptoms persist it would be best for her to have an in person visit.  Roel Douthat [place] MD, [facility]

## 2020-04-19 NOTE — Progress Notes (Signed)
Cardiology Office Note   Date:  04/21/2020   ID:  Talbot GrumblingLoretta W Estrada, DOB Feb 08, 1941, MRN [id number]  PCP:  Blair HeysEhinger, Robert, MD  Cardiologist:   Jonita Hirota SwazilandJordan, MD   Chief Complaint  Patient presents with  . Dizziness      History of Present Illness: Elizabeth Estrada is a 80 y.o. female who is seen for follow up CAD and complete heart block.   She has a history of MVP and family history of CAD. She has a history of murmur. Echo in 2006 mentions mild prolapse but in 2013 she had an Echo showing a small pericardial effusion otherwise normal. No prolapse seen. She also had a normal stress Echo at that time.   She was seen in January 2019 with some palpitations. Seen again in April with symptoms of exertional chest pressure. Echo showed mild to moderate mitral stenosis. ETT was done and demonstrated very poor exercise tolerance with hypertensive BP response. There was 2:1 AV block noted. An event monitor showed episodes of complete heart block. She underwent right and left heart cath. This showed an 80% ostial RCA stenosis and 70% diagonal. There was mild pulmonary HTN without significant mitral stenosis. EDP was elevated c/w diastolic dysfunction. She underwent stenting of the ostial RCA with DES. She was placed on DAPT. Post stent she had persistent episodes of CHB despite correcting ischemia and underwent DDD pacemaker placement on 08/11/17.  Seen in the device clinic on 5/14. RV threshold was elevated. CXR showed no change in lead position.  She was seen by Corine ShelterLuke Kilroy PA in August. Had persistent SOB. Repeat Echo showed no change. 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   Pacemaker check on 04/19/19 was normal.   Recently she experienced symptoms of dizziness described as a lightheadedness. This was not positional. Was worse in the evening. Did have some sinus congestion. As noted Pacer check was OK. She was unable to check BP. Over last 2-3 days these symptoms have  resolved.    Past Medical History:  Diagnosis Date  . 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 Chronic depression   . Coronary artery disease   . Family history of adverse reaction to anesthesia    "dad had PONV"  . Fibromyalgia   . GERD (gastroesophageal reflux disease)   . Heart murmur   . History of kidney stones   . Hypertension    "before stent and pacemaker" (10/13/2017)  . Melanoma (HCC)    removed from back  . MVP (mitral valve prolapse)   . Osteoarthritis   . Osteoporosis   . 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 RA (rheumatoid arthritis) (HCC)    "all over; mainly hands and knees" (10/13/2017)  . Rib fracture     Past Surgical History:  Procedure Laterality Date  . BREAST BIOPSY Left 11/2003   benign - Dr. Jamey RipaStreck  . CARDIAC CATHETERIZATION    . CARDIOVASCULAR STRESS TEST  10/19/1999, 2013   EF 97%, NO EVIDENCE OF ISCHEMIA, 2013 NORMAL  . CATARACT EXTRACTION W/ INTRAOCULAR LENS  IMPLANT, BILATERAL Bilateral   . COLONOSCOPY    . CORONARY STENT INTERVENTION N/A 08/08/2017   Procedure: CORONARY STENT INTERVENTION;  Surgeon: Lyn RecordsSmith, Henry W, MD;  [facility];  Service: Cardiovascular;  Laterality: N/A;  . DILATATION & CURRETTAGE/HYSTEROSCOPY WITH RESECTOCOPE N/A 07/06/2013   Procedure: DILATATION & CURETTAGE/HYSTEROSCOPY WITH RESECTOCOPE with removal of  endometrial lesion;  Surgeon: Annamaria Boots, MD;  Location: [facility];  Service: Gynecology;  Laterality: N/A;  . LEAD REVISION/REPAIR N/A 10/13/2017   Procedure: LEAD REVISION/REPAIR;  Surgeon: Regan Lemming, MD;  Location: [facility] IN[facility]AS[facility] CV LAB;  Service: Cardiovascular;  Laterality: N/A;  . MELANOMA EXCISION  1990s?   from back  . PACEMAKER IMPLANT N/A 08/11/2017   Procedure: PACEMAKER IMPLANT;  Surgeon: Regan Lemming, MD;  Location: [facility] IN[facility]AS[facility] CV LAB;  Service: Cardiovascular;  Laterality: N/A;  . RIGHT/LEFT HEART CATH AND CORONARY ANGIOGRAPHY N/A 08/08/2017    Procedure: RIGHT/LEFT HEART CATH AND CORONARY ANGIOGRAPHY;  Surgeon: Lyn Records, MD;  Location: [facility] IN[facility]ASIVE CV LAB;  Service: Cardiovascular;  Laterality: N/A;     Current Outpatient Medications  Medication Sig Dispense Refill  . abatacept (ORENCIA) 250 MG injection Inject 250 mg into the vein every 30 (thirty) days.     Marland Kitchen aspirin 81 MG tablet Take 81 mg by mouth daily.    Marland Kitchen atorvastatin (LIPITOR) 80 MG tablet TAKE 1 TABLET BY MOUTH EVERY DAY AT 6PM 90 tablet 3  . cetirizine (ZYRTEC) 10 MG tablet Take 10 mg by mouth daily as needed for allergies.    . cholecalciferol (VITAMIN D) 1000 UNITS tablet Take 1,000 Units by mouth daily.    Marland Kitchen escitalopram (LEXAPRO) 20 MG tablet Take 20 mg by mouth at bedtime.     . fluticasone (FLONASE) 50 MCG/ACT nasal spray Place 1-2 sprays into both nostrils daily as needed for allergies or rhinitis.    Bertram Gala Glycol-Propyl Glycol (SYSTANE OP) Apply to eye.     No current facility-administered medications for this visit.    Allergies:   Methotrexate derivatives, Plaquenil [hydroxychloroquine sulfate], Remicade [infliximab], Ridaura [auranofin], and Sudafed [pseudoephedrine]    Social History:  The patient  reports that she has never smoked. She has never used smokeless tobacco. She reports that she does not drink alcohol and does not use drugs.   Family History:  The patient's family history includes CAD (age of onset: 44) in her brother; Coronary artery disease in her mother; Heart disease in her father; Hypertension in her father; Pancreatic cancer in her father.    ROS:  Please see the history of present illness.  . Otherwise, review of systems are positive for none.   All other systems are reviewed and negative.    PHYSICAL EXAM: VS:  BP (!) 142/62   Pulse 63   Ht 5' 2.5" (1.588 m)   Wt 108 lb 6.4 oz (49.2 kg)   LMP 04/15/1990   SpO2 99%   BMI 19.51 kg/m  , BMI Body mass index is 19.51 kg/m. GENERAL:  Well appearing WF in NAD HEENT:   PERRL, EOMI, sclera are clear. Oropharynx is clear. NECK:  No jugular venous distention, carotid upstroke brisk and symmetric, no bruits, no thyromegaly or adenopathy LUNGS:  Clear to auscultation bilaterally CHEST:  Unremarkable HEART:  RRR,  PMI not displaced or sustained,S1 and S2 within normal limits, no S3, no S4: no clicks, no rubs, no murmurs ABD:  Soft, nontender. BS +, no masses or bruits. No hepatomegaly, no splenomegaly EXT:  2 + pulses throughout, no edema, no cyanosis no clubbing SKIN:  Warm and dry.  No rashes NEURO:  Alert and oriented x 3. Cranial nerves II through XII intact. PSYCH:  Cognitively intact       EKG:  EKG is ordered today. AV pacing rate 63. I have personally reviewed and interpreted this  study.     Recent Labs: Lab Results  Component Value Date   WBC 8.8 09/23/2018   HGB 13.2 09/23/2018   HCT 41.1 09/23/2018   PLT 165 09/23/2018   GLUCOSE 86 10/14/2019   CHOL 147 10/14/2019   TRIG 39 10/14/2019   HDL 82 10/14/2019   LDLCALC 56 10/14/2019   ALT 20 10/14/2019   AST 36 10/14/2019   NA 139 10/14/2019   K 4.4 10/14/2019   CL 103 10/14/2019   CREATININE 0.87 10/14/2019   BUN 15 10/14/2019   CO2 26 10/14/2019   TSH 1.610 07/14/2017   INR 1.03 08/13/2017     Lipid Panel    Component Value Date/Time   CHOL 147 10/14/2019 1144   TRIG 39 10/14/2019 1144   HDL 82 10/14/2019 1144   CHOLHDL 1.8 10/14/2019 1144   LDLCALC 56 10/14/2019 1144      Wt Readings from Last 3 Encounters:  04/21/20 108 lb 6.4 oz (49.2 kg)  10/26/19 107 lb (48.5 kg)  10/13/19 107 lb (48.5 kg)      Other studies Reviewed: Additional studies/ records that were reviewed today include: Event monitor 07/24/17: Study Highlights    Normal sinus rhythm  Periods of intermittent complete heart block and Type 2 second degree AV block with 2:1 conduction   Echo 07/31/17: Study Conclusions  - Left ventricle: The cavity size was normal. Wall thickness was   normal.  Systolic function was normal. The estimated ejection   fraction was in the range of 60% to 65%. Wall motion was normal;   there were no regional wall motion abnormalities. Doppler   parameters are consistent with abnormal left ventricular   relaxation (grade 1 diastolic dysfunction). - Aortic valve: There was no stenosis. - Mitral valve: The findings are consistent with moderate stenosis.   There was no significant regurgitation. Mean gradient (D): 8 mm   Hg. Valve area by pressure half-time: 1.49 cm^2. - Left atrium: The atrium was moderately dilated. - Right ventricle: The cavity size was normal. Systolic function   was normal. - Tricuspid valve: Peak RV-RA gradient (S): 26 mm Hg. - Pulmonary arteries: PA peak pressure: 29 mm Hg (S). - Inferior vena cava: The vessel was normal in size. The   respirophasic diameter changes were in the normal range (>= 50%),   consistent with normal central venous pressure. - Pericardium, extracardiac: Small to moderate pericardial   effusion. < 25% respirophasic variation of mitral E inflow   velocity. IVC is not dilated. No significant RV diastolic   collapse. No pericardial tamponade.  Impressions:  - Normal LV size with EF 60-65%. Normal RV size and systolic   function. 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 There was a small to moderate pericardial   effusion without tamponade.  ETT 08/05/17: Study Highlights     Blood pressure demonstrated a hypertensive response to exercise.  There was no ST segment deviation noted during stress.   Abnormal ETT with poor exercise tolerance (1:01); pt complained of chest discomfort during the study; hypertensive BP response; pt developed 2:1 AV block with exertion (that correlated with chest pain) that resolved in recovery; no ST changes. Pt reviewed with Dr Curt Bears prior to DC.    Cardiac cath/PCI: 08/08/17: Conclusion    80% ostial RCA.  The RCA is  dominant.  30 to 40% proximal and mid LAD.  70% proximal diagonal #1.  Widely patent circumflex with tortuous obtuse marginals.  Normal left  main  Calcified mitral valve and annulus.  No significant mitral stenosis is noted.  Mild pulmonary hypertension  Normal left ventricular systolic function with elevated end-diastolic and pulmonary wedge pressure in the 20 to 22 mmHg range consistent with diastolic heart failure, chronic.  Successful PCI and stent of the ostial RCA from 80% with TIMI grade III flow to less than 10% with TIMI grade III flow using a 3.0 x 12 mm Synergy postdilated to 3.5 mmHg pressure.  RECOMMENDATIONS:   Aspirin and Plavix for at least 6 months.  Pacemaker may be needed if continued prolonged episodes of second-degree heart block with bradycardia.  Aggressive risk factor modification.  Probable discharge in a.m. if no complications.      Pacemaker implant 08/11/17: CONCLUSIONS:   1. Successful implantation of a Medtronic Azure XT DR MRI SureScan dual-chamber pacemaker for symptomatic bradycardia  2. No early apparent complications.     Echo 12/05/17: Study Conclusions  - Left ventricle: The cavity size was normal. Systolic function was   vigorous. The estimated ejection fraction was in the range of 65%   to 70%. Wall motion was normal; there were no regional wall   motion abnormalities. Doppler parameters are consistent with   abnormal left ventricular relaxation (grade 1 diastolic   dysfunction). Doppler parameters are consistent with elevated   ventricular end-diastolic filling pressure. - Mitral valve: Moderately thickened, moderately calcified leaflets   . The findings are consistent with moderate stenosis. There was   mild regurgitation. Mean gradient (D): 7 mm Hg. Valve area by   continuity equation (using LVOT flow): 1.17 cm^2. - Left atrium: The atrium was mildly dilated. - Right ventricle: Systolic function was normal. - Right atrium:  The atrium was mildly dilated. - Tricuspid valve: There was mild regurgitation. - Pericardium, extracardiac: A mild to moderate circumferential   pericardial effusion was identified. Features were not consistent   with tamponade physiology.  Impressions:  - No significant change since the prior study on 07/31/2017.    ASSESSMENT AND PLAN:  1. CAD. S/p DES of ostial RCA on 08/08/17. Continue ASA.   Continue statin. Encouraged continued exercise program. Will treat residual disease in a diagonal branch medically.  2. CHB s/p DDD pacemaker insertion on 08/11/17. Normal Pacer follow up 3. Mild-moderate  Mitral stenosis on Echo. Right heart cath without significant stenosis/vavle gradient 4. Diastolic dysfunction- minimally symptomatic.  5. RA. Follow up with  Dr. Amil Amen. 6. 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  7. Dizziness. No arrhythmia noted. ? Related to sinus congestion. Will monitor BP going forward. Fortunately symptoms have resolved.    Current medicines are reviewed at length with the patient today.  The patient does not have concerns regarding medicines.  The following changes have been made:  no change  Labs/ tests ordered today include:   No orders of the defined types were placed in this encounter.    Disposition:   FU with me 6 months  Signed, Maeryn Mcgath Martinique, MD  04/21/2020 2:27 PM    [facility] [address], [place], [place], [address] Phone [phone], Fax [phone]

## 2020-04-19 NOTE — Telephone Encounter (Signed)
Remote transmission received 04/18/20 that showed normal device function,presenting EGM AS / VP at 68 bpm, , no episodes of high atrial or ventricular rates or any other events since last interrogation 01/17/2020.

## 2020-04-21 ENCOUNTER — Other Ambulatory Visit: Payer: Self-pay

## 2020-04-21 ENCOUNTER — Encounter: Payer: Self-pay | Admitting: Cardiology

## 2020-04-21 ENCOUNTER — Ambulatory Visit (INDEPENDENT_AMBULATORY_CARE_PROVIDER_SITE_OTHER): Payer: Medicare Other | Admitting: Cardiology

## 2020-04-21 VITALS — BP 142/62 | HR 63 | Ht 62.5 in | Wt 108.4 lb

## 2020-04-21 DIAGNOSIS — I313 Pericardial effusion (noninflammatory): Secondary | ICD-10-CM

## 2020-04-21 DIAGNOSIS — I442 Atrioventricular block, complete: Secondary | ICD-10-CM

## 2020-04-21 DIAGNOSIS — I2510 Atherosclerotic heart disease of native coronary artery without angina pectoris: Secondary | ICD-10-CM | POA: Diagnosis not present

## 2020-04-21 DIAGNOSIS — Z950 Presence of cardiac pacemaker: Secondary | ICD-10-CM | POA: Diagnosis not present

## 2020-04-21 DIAGNOSIS — R42 Dizziness and giddiness: Secondary | ICD-10-CM

## 2020-04-21 DIAGNOSIS — Z9861 Coronary angioplasty status: Secondary | ICD-10-CM | POA: Diagnosis not present

## 2020-04-21 DIAGNOSIS — I3139 Other pericardial effusion (noninflammatory): Secondary | ICD-10-CM

## 2020-04-26 ENCOUNTER — Telehealth: Payer: Self-pay | Admitting: Cardiology

## 2020-04-26 DIAGNOSIS — I2510 Atherosclerotic heart disease of native coronary artery without angina pectoris: Secondary | ICD-10-CM

## 2020-04-26 DIAGNOSIS — Z9861 Coronary angioplasty status: Secondary | ICD-10-CM

## 2020-04-26 NOTE — Telephone Encounter (Signed)
Pt c/o medication issue:  1. Name of Medication: atorvastatin (LIPITOR) 80 MG tablet  2. How are you currently taking this medication (dosage and times per day)? As directed   3. Are you having a reaction (difficulty breathing--STAT)? no  4. What is your medication issue? Patient said her Liver Enzymes were elevated and her PCP told her to stop taking this medication.  The patient is concerned about this decision and wanted to wait to stop taking the medication until she knew what Dr. Curt Bears had to say

## 2020-04-27 MED ORDER — ATORVASTATIN CALCIUM 40 MG PO TABS: 40.0000 mg | ORAL_TABLET | Freq: Every day | ORAL | 3 refills | Status: DC

## 2020-04-27 NOTE — Telephone Encounter (Signed)
Pt made aware that this recommendation would be better suited coming from her general cardiologist, Dr. Martinique. Aware I will forward to him/his nurse to address.... KPN reports ALT 63  Pt made aware that I may be next week before she hears back from the office on this matter and she is agreeable to plan.

## 2020-04-27 NOTE — Telephone Encounter (Signed)
Spoke to patient Dr.Jordan's advice given.Lab orders mailed.

## 2020-04-27 NOTE — Telephone Encounter (Signed)
Patient returning call.

## 2020-04-27 NOTE — Telephone Encounter (Signed)
I would reduce dose of lipitor to 40 mg daily and repeat LFTs and lipids in 3 months.  Tanesia Butner Martinique MD, [facility]

## 2020-04-27 NOTE — Telephone Encounter (Signed)
Called patient no answer. Left message to call back.

## 2020-05-02 NOTE — Progress Notes (Signed)
Remote pacemaker transmission.   

## 2020-05-10 DIAGNOSIS — M0579 Rheumatoid arthritis with rheumatoid factor of multiple sites without organ or systems involvement: Secondary | ICD-10-CM | POA: Diagnosis not present

## 2020-05-31 DIAGNOSIS — R748 Abnormal levels of other serum enzymes: Secondary | ICD-10-CM | POA: Diagnosis not present

## 2020-06-08 DIAGNOSIS — M0579 Rheumatoid arthritis with rheumatoid factor of multiple sites without organ or systems involvement: Secondary | ICD-10-CM | POA: Diagnosis not present

## 2020-07-13 DIAGNOSIS — M0579 Rheumatoid arthritis with rheumatoid factor of multiple sites without organ or systems involvement: Secondary | ICD-10-CM | POA: Diagnosis not present

## 2020-07-18 ENCOUNTER — Ambulatory Visit (INDEPENDENT_AMBULATORY_CARE_PROVIDER_SITE_OTHER): Payer: Medicare Other

## 2020-07-18 DIAGNOSIS — I441 Atrioventricular block, second degree: Secondary | ICD-10-CM

## 2020-07-18 LAB — CUP PACEART REMOTE DEVICE CHECK
Battery Remaining Longevity: 96 mo
Battery Voltage: 2.99 V
Brady Statistic AP VP Percent: 34.83 %
Brady Statistic AP VS Percent: 0.14 %
Brady Statistic AS VP Percent: 64.65 %
Brady Statistic AS VS Percent: 0.37 %
Brady Statistic RA Percent Paced: 34.81 %
Brady Statistic RV Percent Paced: 99.48 %
Date Time Interrogation Session: 20220405061931
Implantable Lead Implant Date: 20190429
Implantable Lead Implant Date: 20190429
Implantable Lead Location: 753859
Implantable Lead Location: 753860
Implantable Lead Model: 5076
Implantable Lead Model: 5076
Implantable Pulse Generator Implant Date: 20190429
Lead Channel Impedance Value: 342 Ohm
Lead Channel Impedance Value: 342 Ohm
Lead Channel Impedance Value: 380 Ohm
Lead Channel Impedance Value: 475 Ohm
Lead Channel Pacing Threshold Amplitude: 0.5 V
Lead Channel Pacing Threshold Amplitude: 0.625 V
Lead Channel Pacing Threshold Pulse Width: 0.4 ms
Lead Channel Pacing Threshold Pulse Width: 0.4 ms
Lead Channel Sensing Intrinsic Amplitude: 1.625 mV
Lead Channel Sensing Intrinsic Amplitude: 1.625 mV
Lead Channel Sensing Intrinsic Amplitude: 18.75 mV
Lead Channel Sensing Intrinsic Amplitude: 18.75 mV
Lead Channel Setting Pacing Amplitude: 1.5 V
Lead Channel Setting Pacing Amplitude: 2.5 V
Lead Channel Setting Pacing Pulse Width: 0.4 ms
Lead Channel Setting Sensing Sensitivity: 1.2 mV

## 2020-07-26 ENCOUNTER — Other Ambulatory Visit: Payer: Self-pay

## 2020-07-26 ENCOUNTER — Emergency Department (HOSPITAL_BASED_OUTPATIENT_CLINIC_OR_DEPARTMENT_OTHER): Payer: Medicare Other

## 2020-07-26 ENCOUNTER — Encounter (HOSPITAL_BASED_OUTPATIENT_CLINIC_OR_DEPARTMENT_OTHER): Payer: Self-pay | Admitting: *Deleted

## 2020-07-26 ENCOUNTER — Emergency Department (HOSPITAL_BASED_OUTPATIENT_CLINIC_OR_DEPARTMENT_OTHER)
Admission: EM | Admit: 2020-07-26 | Discharge: 2020-07-26 | Disposition: A | Discharge status: Home / Self Care | Payer: Medicare Other | Attending: Emergency Medicine | Admitting: Emergency Medicine

## 2020-07-26 DIAGNOSIS — S90812A Abrasion, left foot, initial encounter: Secondary | ICD-10-CM | POA: Diagnosis not present

## 2020-07-26 DIAGNOSIS — Y9269 Other specified industrial and construction area as the place of occurrence of the external cause: Secondary | ICD-10-CM | POA: Insufficient documentation

## 2020-07-26 DIAGNOSIS — I2510 Atherosclerotic heart disease of native coronary artery without angina pectoris: Secondary | ICD-10-CM | POA: Insufficient documentation

## 2020-07-26 DIAGNOSIS — Z7982 Long term (current) use of aspirin: Secondary | ICD-10-CM | POA: Insufficient documentation

## 2020-07-26 DIAGNOSIS — M79672 Pain in left foot: Secondary | ICD-10-CM | POA: Diagnosis not present

## 2020-07-26 DIAGNOSIS — W01198A Fall on same level from slipping, tripping and stumbling with subsequent striking against other object, initial encounter: Secondary | ICD-10-CM | POA: Insufficient documentation

## 2020-07-26 DIAGNOSIS — Z79899 Other long term (current) drug therapy: Secondary | ICD-10-CM | POA: Insufficient documentation

## 2020-07-26 DIAGNOSIS — S93402A Sprain of unspecified ligament of left ankle, initial encounter: Secondary | ICD-10-CM | POA: Diagnosis not present

## 2020-07-26 DIAGNOSIS — Z950 Presence of cardiac pacemaker: Secondary | ICD-10-CM | POA: Diagnosis not present

## 2020-07-26 DIAGNOSIS — I10 Essential (primary) hypertension: Secondary | ICD-10-CM | POA: Diagnosis not present

## 2020-07-26 DIAGNOSIS — S99912A Unspecified injury of left ankle, initial encounter: Secondary | ICD-10-CM | POA: Diagnosis present

## 2020-07-26 DIAGNOSIS — M25572 Pain in left ankle and joints of left foot: Secondary | ICD-10-CM | POA: Diagnosis not present

## 2020-07-26 NOTE — Discharge Instructions (Addendum)
No fracture on x-ray.  Suspect you have a bone bruise/ankle sprain/foot sprain.  Recommend Tylenol Motrin and ice and activity as tolerated.  Follow-up with your primary care doctor.  You may need physical therapy.

## 2020-07-26 NOTE — ED Provider Notes (Signed)
[facility] EMERGENCY DEPT Provider Note   CSN: [id number] Arrival date & time:        History Chief Complaint  Patient presents with  . Fall    Elizabeth Estrada is a 80 y.o. female.  The history is provided by the patient.  Leg Pain Location:  Foot and ankle Ankle location:  L ankle Foot location:  L foot Pain details:    Quality:  Aching   Radiates to:  Does not radiate   Severity:  Mild   Onset quality:  Gradual   Timing:  Constant   Progression:  Unchanged Relieved by:  Nothing Worsened by:  Bearing weight Associated symptoms: no back pain, no decreased ROM, no fever, no itching, no muscle weakness, no neck pain, no numbness, no stiffness, no swelling and no tingling        Past Medical History:  Diagnosis Date  . 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 Chronic depression   . Coronary artery disease   . Family history of adverse reaction to anesthesia    "dad had PONV"  . Fibromyalgia   . GERD (gastroesophageal reflux disease)   . Heart murmur   . History of kidney stones   . Hypertension    "before stent and pacemaker" (10/13/2017)  . Melanoma ([place])    removed from back  . MVP (mitral valve prolapse)   . Osteoarthritis   . Osteoporosis   . 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 RA (rheumatoid arthritis) ([place])    "all over; mainly hands and knees" (10/13/2017)  . Rib fracture     Patient Active Problem List   Diagnosis Date Noted  . Pacemaker 11/18/2017  . Second degree AV block 10/13/2017  . Abnormal stress ECG with treadmill 08/09/2017  . CAD S/P percutaneous coronary angioplasty   . SOB (shortness of breath) 07/14/2017  . PSVT (paroxysmal supraventricular tachycardia) ([place]) 07/14/2017  . Family history of coronary artery disease in brother 07/14/2017  . Chest tightness 07/14/2017  . Cyst of right ovary 01/02/2015  . Kidney stones 01/02/2015  . Depression 03/17/2014  . Fibromyalgia  03/17/2014  . Rheumatoid arthritis ([place]) 03/17/2014  . Cephalalgia 03/17/2014  . Chest pain, atypical 05/17/2011  . Mitral stenosis-mild 05/17/2011    Past Surgical History:  Procedure Laterality Date  . BREAST BIOPSY Left 11/2003   benign - Dr. Margot Chimes  . CARDIAC CATHETERIZATION    . CARDIOVASCULAR STRESS TEST  10/19/1999, 2013   EF 97%, NO EVIDENCE OF ISCHEMIA, 2013 NORMAL  . CATARACT EXTRACTION W/ INTRAOCULAR LENS  IMPLANT, BILATERAL Bilateral   . COLONOSCOPY    . CORONARY STENT INTERVENTION N/A 08/08/2017   Procedure: CORONARY STENT INTERVENTION;  Surgeon: [name], MD;  Location: [facility] CV LAB;  Service: Cardiovascular;  Laterality: N/A;  . DILATATION & CURRETTAGE/HYSTEROSCOPY WITH RESECTOCOPE N/A 07/06/2013   Procedure: DILATATION & CURETTAGE/HYSTEROSCOPY WITH RESECTOCOPE with removal of endometrial lesion;  Surgeon: [name], MD;  Location: [facility];  Service: Gynecology;  Laterality: N/A;  . LEAD REVISION/REPAIR N/A 10/13/2017   Procedure: LEAD REVISION/REPAIR;  Surgeon: [name], MD;  Location: [facility] CV LAB;  Service: Cardiovascular;  Laterality: N/A;  . MELANOMA EXCISION  1990s?   from back  . PACEMAKER IMPLANT N/A 08/11/2017   Procedure: PACEMAKER IMPLANT;  Surgeon: [name], MD;  Location: [facility] CV LAB;  Service: Cardiovascular;  Laterality: N/A;  . RIGHT/LEFT HEART  CATH AND CORONARY ANGIOGRAPHY N/A 08/08/2017   Procedure: RIGHT/LEFT HEART CATH AND CORONARY ANGIOGRAPHY;  Surgeon: [name], MD;  Location: [facility] CV LAB;  Service: Cardiovascular;  Laterality: N/A;     OB History    Gravida  0   Para  0   Term      Preterm      AB      Living        SAB      IAB      Ectopic      Multiple      Live Births              Family History  Problem Relation Age of Onset  . Coronary artery disease Mother   . Pancreatic cancer Father   . Heart disease Father        bypass  . Hypertension Father    . CAD Brother 26       CABG and AVR    Social History   Tobacco Use  . Smoking status: Never Smoker  . Smokeless tobacco: Never Used  Vaping Use  . Vaping Use: Never used  Substance Use Topics  . Alcohol use: Never  . Drug use: Never    Home Medications Prior to Admission medications   Medication Sig Start Date End Date Taking? Authorizing Provider  aspirin 81 MG tablet Take 81 mg by mouth daily.   Yes [provider]  atorvastatin (LIPITOR) 40 MG tablet Take 1 tablet (40 mg total) by mouth daily. 04/27/20 07/26/20 Yes Martinique, Peter M, MD  cholecalciferol (VITAMIN D) 1000 UNITS tablet Take 1,000 Units by mouth daily.   Yes [provider]  escitalopram (LEXAPRO) 20 MG tablet Take 20 mg by mouth at bedtime.    Yes [provider]  Polyethyl Glycol-Propyl Glycol (SYSTANE OP) Apply to eye.   Yes [provider]  abatacept (ORENCIA) 250 MG injection Inject 250 mg into the vein every 30 (thirty) days.     [provider]  cetirizine (ZYRTEC) 10 MG tablet Take 10 mg by mouth daily as needed for allergies.    [provider]  fluticasone (FLONASE) 50 MCG/ACT nasal spray Place 1-2 sprays into both nostrils daily as needed for allergies or rhinitis.    [provider]    Allergies    Methotrexate derivatives, Plaquenil [hydroxychloroquine sulfate], Remicade [infliximab], Ridaura [auranofin], and Sudafed [pseudoephedrine]  Review of Systems   Review of Systems  Constitutional: Negative for fever.  Musculoskeletal: Positive for arthralgias and gait problem. Negative for back pain, joint swelling, myalgias, neck pain, neck stiffness and stiffness.  Skin: Positive for wound. Negative for color change, itching and rash.  Neurological: Negative for syncope and headaches.  All other systems reviewed and are negative.   Physical Exam Updated Vital Signs  ED Triage Vitals [07/26/20 1735]  Enc Vitals Group     BP (!) 128/105      Pulse Rate 71     Resp 16     Temp 98.2 F (36.8 C)     Temp Source Oral     SpO2 100 %     Weight      Height      Head Circumference      Peak Flow      Pain Score      Pain Loc      Pain Edu?      Excl. in [place]?     Physical  Exam Cardiovascular:     Pulses: Normal pulses.  Musculoskeletal:        General: Tenderness present. No swelling or deformity. Normal range of motion.     Comments: ttp to lateral left ankle and top of left foot   Skin:    Capillary Refill: Capillary refill takes less than 2 seconds.     Comments: Abrasion to top of left foot   Neurological:     Mental Status: She is alert.     Sensory: No sensory deficit.     Motor: No weakness.  Psychiatric:        Mood and Affect: Mood normal.     ED Results / Procedures / Treatments   Labs (all labs ordered are listed, but only abnormal results are displayed) Labs Reviewed - No data to display  EKG None  Radiology DG Ankle Complete Left  Result Date: 07/26/2020 CLINICAL DATA:  Left foot and ankle pain. Fall today. EXAM: LEFT ANKLE COMPLETE - 3+ VIEW COMPARISON:  None. FINDINGS: There is no evidence of fracture or dislocation. Ankle mortise is preserved. There is a small ankle joint effusion. Soft tissues are unremarkable. IMPRESSION: Small ankle joint effusion. No fracture or subluxation. Electronically Signed   By: Keith Rake M.D.   On: 07/26/2020 18:01   DG Foot Complete Left  Result Date: 07/26/2020 CLINICAL DATA:  Left foot and ankle pain.  Fall today. EXAM: LEFT FOOT - COMPLETE 3+ VIEW COMPARISON:  None. FINDINGS: There is no evidence of fracture or dislocation. Mild osteoarthritis of the first metatarsal phalangeal joint. Minimal talonavicular degenerative change. Soft tissues are unremarkable. IMPRESSION: No acute fracture or subluxation of the left foot. Electronically Signed   By: Keith Rake M.D.   On: 07/26/2020 18:00    Procedures Procedures   Medications Ordered in  ED Medications - No data to display  ED Course  I have reviewed the triage vital signs and the nursing notes.  Pertinent labs & imaging results that were available during my care of the patient were reviewed by me and considered in my medical decision making (see chart for details).    MDM Rules/Calculators/A&P                          Ballard Russell patient here with left foot and ankle pain after hitting her left ankle.  Did not lose consciousness or hit her head.  X-ray shows no fracture or subluxation of the ankle or the foot.  Overall suspect ankle sprain/foot sprain.  Put in a cam boot.  Recommend activity as tolerated.  Recommend Tylenol, ice, Motrin, rest.  Neurovascular neuromuscularly intact.  Discharged in good condition. F/u with PCP.  This chart was dictated using voice recognition software.  Despite best efforts to proofread,  errors can occur which can change the documentation meaning.    Final Clinical Impression(s) / ED Diagnoses Final diagnoses:  Sprain of left ankle, unspecified ligament, initial encounter    Rx / DC Orders ED Discharge Orders         Ordered    Crutches        07/26/20 1804           Lennice Sites, DO 07/26/20 1805

## 2020-07-26 NOTE — ED Triage Notes (Signed)
Fell at the [facility] today and left foot is very tender.

## 2020-07-27 DIAGNOSIS — Z23 Encounter for immunization: Secondary | ICD-10-CM | POA: Diagnosis not present

## 2020-07-27 DIAGNOSIS — Z9861 Coronary angioplasty status: Secondary | ICD-10-CM | POA: Diagnosis not present

## 2020-07-27 DIAGNOSIS — I2510 Atherosclerotic heart disease of native coronary artery without angina pectoris: Secondary | ICD-10-CM | POA: Diagnosis not present

## 2020-07-28 LAB — LIPID PANEL
Chol/HDL Ratio: 1.9 ratio (ref 0.0–4.4)
Cholesterol, Total: 161 mg/dL (ref 100–199)
HDL: 87 mg/dL (ref >39)
LDL Chol Calc (NIH): 66 mg/dL (ref 0–99)
Triglycerides: 34 mg/dL (ref 0–149)
VLDL Cholesterol Cal: 8 mg/dL (ref 5–40)

## 2020-07-28 LAB — HEPATIC FUNCTION PANEL
ALT: 18 IU/L (ref 0–32)
AST: 33 IU/L (ref 0–40)
Albumin: 3.9 g/dL (ref 3.7–4.7)
Alkaline Phosphatase: 87 IU/L (ref 44–121)
Bilirubin Total: 1 mg/dL (ref 0.0–1.2)
Bilirubin, Direct: 0.12 mg/dL (ref 0.00–0.40)
Total Protein: 6.5 g/dL (ref 6.0–8.5)

## 2020-07-28 NOTE — Progress Notes (Signed)
Remote pacemaker transmission.   

## 2020-08-10 DIAGNOSIS — M0579 Rheumatoid arthritis with rheumatoid factor of multiple sites without organ or systems involvement: Secondary | ICD-10-CM | POA: Diagnosis not present

## 2020-09-08 DIAGNOSIS — M0579 Rheumatoid arthritis with rheumatoid factor of multiple sites without organ or systems involvement: Secondary | ICD-10-CM | POA: Diagnosis not present

## 2020-10-17 ENCOUNTER — Ambulatory Visit (INDEPENDENT_AMBULATORY_CARE_PROVIDER_SITE_OTHER): Payer: Medicare Other

## 2020-10-17 DIAGNOSIS — I441 Atrioventricular block, second degree: Secondary | ICD-10-CM

## 2020-10-17 DIAGNOSIS — Z20828 Contact with and (suspected) exposure to other viral communicable diseases: Secondary | ICD-10-CM | POA: Diagnosis not present

## 2020-10-17 DIAGNOSIS — M0579 Rheumatoid arthritis with rheumatoid factor of multiple sites without organ or systems involvement: Secondary | ICD-10-CM | POA: Diagnosis not present

## 2020-10-18 LAB — CUP PACEART REMOTE DEVICE CHECK
Battery Remaining Longevity: 91 mo
Battery Voltage: 2.98 V
Brady Statistic AP VP Percent: 33.27 %
Brady Statistic AP VS Percent: 0 %
Brady Statistic AS VP Percent: 66.71 %
Brady Statistic AS VS Percent: 0.02 %
Brady Statistic RA Percent Paced: 33.12 %
Brady Statistic RV Percent Paced: 99.98 %
Date Time Interrogation Session: 20220705143100
Implantable Lead Implant Date: 20190429
Implantable Lead Implant Date: 20190429
Implantable Lead Location: 753859
Implantable Lead Location: 753860
Implantable Lead Model: 5076
Implantable Lead Model: 5076
Implantable Pulse Generator Implant Date: 20190429
Lead Channel Impedance Value: 323 Ohm
Lead Channel Impedance Value: 342 Ohm
Lead Channel Impedance Value: 361 Ohm
Lead Channel Impedance Value: 456 Ohm
Lead Channel Pacing Threshold Amplitude: 0.625 V
Lead Channel Pacing Threshold Amplitude: 0.625 V
Lead Channel Pacing Threshold Pulse Width: 0.4 ms
Lead Channel Pacing Threshold Pulse Width: 0.4 ms
Lead Channel Sensing Intrinsic Amplitude: 2.5 mV
Lead Channel Sensing Intrinsic Amplitude: 2.5 mV
Lead Channel Sensing Intrinsic Amplitude: 21.125 mV
Lead Channel Sensing Intrinsic Amplitude: 21.125 mV
Lead Channel Setting Pacing Amplitude: 1.5 V
Lead Channel Setting Pacing Amplitude: 2.5 V
Lead Channel Setting Pacing Pulse Width: 0.4 ms
Lead Channel Setting Sensing Sensitivity: 1.2 mV

## 2020-10-18 NOTE — Progress Notes (Signed)
Cardiology Office Note   Date:  10/20/2020   ID:  Elizabeth, Estrada October 26, 1940, MRN [id number]  PCP:  [name], MD  Cardiologist:   Demari Gales Martinique, MD   Chief Complaint  Patient presents with   Coronary Artery Disease       History of Present Illness: Elizabeth Estrada is a 80 y.o. female who is seen for follow up CAD and complete heart block.   She has a history of MVP/MS and family history of CAD. She has a history of murmur. Echo in 2006 mentions mild prolapse but in 2013 she had an Echo showing a small pericardial effusion otherwise normal. No prolapse seen. She also had a normal stress Echo at that time.   She was seen in January 2019 with some palpitations. Seen again in April with symptoms of exertional chest pressure. Echo showed mild to moderate mitral stenosis. ETT was done and demonstrated very poor exercise tolerance with hypertensive BP response. There was 2:1 AV block noted. An event monitor showed episodes of complete heart block. She underwent right and left heart cath. This showed an 80% ostial RCA stenosis and 70% diagonal. There was mild pulmonary HTN without significant mitral stenosis. EDP was elevated c/w diastolic dysfunction. She underwent stenting of the ostial RCA with DES. She was placed on DAPT. Post stent she had persistent episodes of CHB despite correcting ischemia and underwent DDD pacemaker placement on 08/11/17.  Seen in the device clinic on 5/14. RV threshold was elevated. CXR showed no change in lead position.  She was seen by [name] in August 2019. Had persistent SOB. Repeat Echo showed no change. 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 Also has moderate mitral stenosis.   Pacemaker check on 10/17/20 was normal.   On follow up today she is feeling well. Does ride a stationary bike some. Denies SOB or palpitations. Noted a strange feeling under her left breast for one day but this went away. No edema.    Past  Medical History:  Diagnosis Date   AV block    a. 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 Margot Chimes   CARDIAC CATHETERIZATION     CARDIOVASCULAR STRESS TEST  10/19/1999, 2013   EF 97%, NO EVIDENCE OF ISCHEMIA, 2013 NORMAL   CATARACT EXTRACTION W/ INTRAOCULAR LENS  IMPLANT, BILATERAL Bilateral    COLONOSCOPY     CORONARY STENT INTERVENTION N/A 08/08/2017   Procedure: CORONARY STENT INTERVENTION;  Surgeon: [name], MD;  Location: [facility];  Service: Cardiovascular;  Laterality: N/A;   DILATATION & CURRETTAGE/HYSTEROSCOPY WITH RESECTOCOPE N/A 07/06/2013   Procedure: DILATATION & CURETTAGE/HYSTEROSCOPY WITH RESECTOCOPE with removal of  endometrial lesion;  Surgeon: [name], MD;  Location: [facility];  Service: Gynecology;  Laterality: N/A;   LEAD REVISION/REPAIR N/A 10/13/2017   Procedure: LEAD REVISION/REPAIR;  Surgeon: [name], MD;  Location: [facility];  Service: Cardiovascular;  Laterality: N/A;   MELANOMA EXCISION  1990s?   from back   PACEMAKER IMPLANT N/A 08/11/2017   Procedure: PACEMAKER IMPLANT;  Surgeon: [name], MD;  Location: [facility];  Service: Cardiovascular;  Laterality: N/A;   RIGHT/LEFT HEART CATH AND CORONARY ANGIOGRAPHY N/A 08/08/2017   Procedure: RIGHT/LEFT HEART CATH AND  CORONARY ANGIOGRAPHY;  Surgeon: [name], MD;  Location: [facility];  Service: Cardiovascular;  Laterality: N/A;     Current Outpatient Medications  Medication Sig Dispense Refill   abatacept (ORENCIA) 250 MG injection Inject 250 mg into the vein every 30 (thirty) days.      aspirin 81 MG tablet Take 81 mg by mouth daily.     atorvastatin (LIPITOR) 80 MG tablet Take 40 mg by mouth daily.     cetirizine (ZYRTEC) 10 MG tablet Take 10 mg by mouth daily as needed for allergies.     escitalopram (LEXAPRO) 20 MG tablet Take 20 mg by mouth at bedtime.      fluticasone (FLONASE) 50 MCG/ACT nasal spray Place 1-2 sprays into both nostrils daily as needed for allergies or rhinitis.     Polyethyl Glycol-Propyl Glycol (SYSTANE OP) Apply to eye.     No current facility-administered medications for this visit.    Allergies:   Methotrexate derivatives, Plaquenil [hydroxychloroquine sulfate], Remicade [infliximab], Ridaura [auranofin], and Sudafed [pseudoephedrine]    Social History:  The patient  reports that she has never smoked. She has never used smokeless tobacco. She reports that she does not drink alcohol and does not use drugs.   Family History:  The patient's family history includes CAD (age of onset: 11) in her brother; Coronary artery disease in her mother; Heart disease in her father; Hypertension in her father; Pancreatic cancer in her father.    ROS:  Please see the history of present illness.  . Otherwise, review of systems are positive for none.   All other systems are reviewed and negative.    PHYSICAL EXAM: VS:  BP 108/68   Pulse 75   Resp 18   Ht 5\' 2"  (1.575 m)   Wt 109 lb 6.4 oz (49.6 kg)   LMP 04/15/1990   SpO2 98%   BMI 20.01 kg/m  , BMI Body mass index is 20.01 kg/m. GENERAL:  Well appearing WF in NAD HEENT:  PERRL, EOMI, sclera are clear. Oropharynx is clear. NECK:  No jugular venous distention, carotid upstroke brisk and symmetric, no bruits, no  thyromegaly or adenopathy LUNGS:  Clear to auscultation bilaterally CHEST:  Unremarkable HEART:  RRR,  PMI not displaced or sustained,S1 and S2 within normal limits, no S3, no S4: no clicks, no rubs, no murmurs ABD:  Soft, nontender. BS +, no masses or bruits. No hepatomegaly, no splenomegaly EXT:  2 + pulses throughout, no edema, no cyanosis no clubbing SKIN:  Warm and dry.  No rashes NEURO:  Alert and oriented x 3. Cranial nerves II through XII intact. PSYCH:  Cognitively intact       EKG:  EKG is not ordered today.      Recent Labs: Lab Results  Component Value Date   WBC 8.8 09/23/2018   HGB 13.2 09/23/2018   HCT 41.1  09/23/2018   PLT 165 09/23/2018   GLUCOSE 86 10/14/2019   CHOL 161 07/27/2020   TRIG 34 07/27/2020   HDL 87 07/27/2020   LDLCALC 66 07/27/2020   ALT 18 07/27/2020   AST 33 07/27/2020   NA 139 10/14/2019   K 4.4 10/14/2019   CL 103 10/14/2019   CREATININE 0.87 10/14/2019   BUN 15 10/14/2019   CO2 26 10/14/2019   TSH 1.610 07/14/2017   INR 1.03 08/13/2017     Lipid Panel    Component Value Date/Time   CHOL 161 07/27/2020 1143   TRIG 34 07/27/2020 1143   HDL 87 07/27/2020 1143   CHOLHDL 1.9 07/27/2020 1143   LDLCALC 66 07/27/2020 1143      Wt Readings from Last 3 Encounters:  10/20/20 109 lb 6.4 oz (49.6 kg)  07/26/20 108 lb (49 kg)  04/21/20 108 lb 6.4 oz (49.2 kg)      Other studies Reviewed: Additional studies/ records that were reviewed today include: Event monitor 07/24/17: Study Highlights   Normal sinus rhythm Periods of intermittent complete heart block and Type 2 second degree AV block with 2:1 conduction   Echo 07/31/17: Study Conclusions   - Left ventricle: The cavity size was normal. Wall thickness was   normal. Systolic function was normal. The estimated ejection   fraction was in the range of 60% to 65%. Wall motion was normal;   there were no regional wall motion abnormalities. Doppler   parameters are consistent  with abnormal left ventricular   relaxation (grade 1 diastolic dysfunction). - Aortic valve: There was no stenosis. - Mitral valve: The findings are consistent with moderate stenosis.   There was no significant regurgitation. Mean gradient (D): 8 mm   Hg. Valve area by pressure half-time: 1.49 cm^2. - Left atrium: The atrium was moderately dilated. - Right ventricle: The cavity size was normal. Systolic function   was normal. - Tricuspid valve: Peak RV-RA gradient (S): 26 mm Hg. - Pulmonary arteries: PA peak pressure: 29 mm Hg (S). - Inferior vena cava: The vessel was normal in size. The   respirophasic diameter changes were in the normal range (>= 50%),   consistent with normal central venous pressure. - Pericardium, extracardiac: Small to moderate pericardial   effusion. < 25% respirophasic variation of mitral E inflow   velocity. IVC is not dilated. No significant RV diastolic   collapse. No pericardial tamponade.   Impressions:   - Normal LV size with EF 60-65%. Normal RV size and systolic   function. 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 There was a small to moderate pericardial   effusion without tamponade.  ETT 08/05/17: Study Highlights     Blood pressure demonstrated a hypertensive response to exercise. There was no ST segment deviation noted during stress.   Abnormal ETT with poor exercise tolerance (1:01); pt complained of chest discomfort during the study; hypertensive BP response; pt developed 2:1 AV block with exertion (that correlated with chest pain) that resolved in recovery; no ST changes. Pt reviewed with Dr Curt Bears prior to DC.     Cardiac cath/PCI: 08/08/17: Conclusion   80% ostial RCA.  The RCA is dominant. 30 to 40% proximal and mid LAD.  70% proximal diagonal #1. Widely patent circumflex with tortuous obtuse marginals. Normal left main Calcified mitral valve and annulus.  No significant mitral stenosis is  noted. Mild pulmonary hypertension Normal left ventricular systolic function with elevated end-diastolic and pulmonary wedge pressure  in the 20 to 22 mmHg range consistent with diastolic heart failure, chronic. Successful PCI and stent of the ostial RCA from 80% with TIMI grade III flow to less than 10% with TIMI grade III flow using a 3.0 x 12 mm Synergy postdilated to 3.5 mmHg pressure.   RECOMMENDATIONS:   Aspirin and Plavix for at least 6 months. Pacemaker may be needed if continued prolonged episodes of second-degree heart block with bradycardia. Aggressive risk factor modification. Probable discharge in a.m. if no complications.      Pacemaker implant 08/11/17: CONCLUSIONS:   1. Successful implantation of a Medtronic Azure XT DR MRI SureScan dual-chamber pacemaker for symptomatic bradycardia  2. No early apparent complications.     Echo 12/05/17: Study Conclusions   - Left ventricle: The cavity size was normal. Systolic function was   vigorous. The estimated ejection fraction was in the range of 65%   to 70%. Wall motion was normal; there were no regional wall   motion abnormalities. Doppler parameters are consistent with   abnormal left ventricular relaxation (grade 1 diastolic   dysfunction). Doppler parameters are consistent with elevated   ventricular end-diastolic filling pressure. - Mitral valve: Moderately thickened, moderately calcified leaflets   . The findings are consistent with moderate stenosis. There was   mild regurgitation. Mean gradient (D): 7 mm Hg. Valve area by   continuity equation (using LVOT flow): 1.17 cm^2. - Left atrium: The atrium was mildly dilated. - Right ventricle: Systolic function was normal. - Right atrium: The atrium was mildly dilated. - Tricuspid valve: There was mild regurgitation. - Pericardium, extracardiac: A mild to moderate circumferential   pericardial effusion was identified. Features were not consistent   with tamponade  physiology.   Impressions:   - No significant change since the prior study on 07/31/2017.     ASSESSMENT AND PLAN:  1. CAD. S/p DES of ostial RCA on 08/08/17. No significant angina. Continue ASA.   Continue statin. Encouraged continued exercise program. Will treat residual disease in a diagonal branch medically.  2. CHB s/p DDD pacemaker insertion on 08/11/17. Normal Pacer follow up 3. Mild-moderate  Mitral stenosis on Echo. Right heart cath without significant stenosis/vavle gradient. No new symptoms. 4. Diastolic dysfunction 5. RA. Follow up with  Dr. Amil Amen. On [place].  6. 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    Current medicines are reviewed at length with the patient today.  The patient does not have concerns regarding medicines.  The following changes have been made:  no change  Labs/ tests ordered today include:   No orders of the defined types were placed in this encounter.    Disposition:   FU with me 6 months  Signed, Cicely Ortner Martinique, MD  10/20/2020 3:44 PM    [facility] [address] [place], [place], [address] Phone [phone], Fax [phone]

## 2020-10-20 ENCOUNTER — Ambulatory Visit (INDEPENDENT_AMBULATORY_CARE_PROVIDER_SITE_OTHER): Payer: Medicare Other | Admitting: Cardiology

## 2020-10-20 ENCOUNTER — Other Ambulatory Visit: Payer: Self-pay

## 2020-10-20 ENCOUNTER — Encounter: Payer: Self-pay | Admitting: Cardiology

## 2020-10-20 VITALS — BP 108/68 | HR 75 | Resp 18 | Ht 62.0 in | Wt 109.4 lb

## 2020-10-20 DIAGNOSIS — I2510 Atherosclerotic heart disease of native coronary artery without angina pectoris: Secondary | ICD-10-CM

## 2020-10-20 DIAGNOSIS — I441 Atrioventricular block, second degree: Secondary | ICD-10-CM | POA: Diagnosis not present

## 2020-10-20 DIAGNOSIS — Z950 Presence of cardiac pacemaker: Secondary | ICD-10-CM | POA: Diagnosis not present

## 2020-10-20 DIAGNOSIS — I050 Rheumatic mitral stenosis: Secondary | ICD-10-CM | POA: Diagnosis not present

## 2020-10-20 DIAGNOSIS — I3139 Other pericardial effusion (noninflammatory): Secondary | ICD-10-CM

## 2020-10-20 DIAGNOSIS — I313 Pericardial effusion (noninflammatory): Secondary | ICD-10-CM | POA: Diagnosis not present

## 2020-10-20 DIAGNOSIS — Z9861 Coronary angioplasty status: Secondary | ICD-10-CM | POA: Diagnosis not present

## 2020-10-24 DIAGNOSIS — M810 Age-related osteoporosis without current pathological fracture: Secondary | ICD-10-CM | POA: Diagnosis not present

## 2020-10-24 DIAGNOSIS — M5136 Other intervertebral disc degeneration, lumbar region: Secondary | ICD-10-CM | POA: Diagnosis not present

## 2020-10-24 DIAGNOSIS — M25461 Effusion, right knee: Secondary | ICD-10-CM | POA: Diagnosis not present

## 2020-10-24 DIAGNOSIS — M150 Primary generalized (osteo)arthritis: Secondary | ICD-10-CM | POA: Diagnosis not present

## 2020-10-24 DIAGNOSIS — M0579 Rheumatoid arthritis with rheumatoid factor of multiple sites without organ or systems involvement: Secondary | ICD-10-CM | POA: Diagnosis not present

## 2020-10-24 DIAGNOSIS — Z681 Body mass index (BMI) 19 or less, adult: Secondary | ICD-10-CM | POA: Diagnosis not present

## 2020-11-03 NOTE — Progress Notes (Signed)
Remote pacemaker transmission.   

## 2020-11-07 ENCOUNTER — Encounter: Payer: Medicare Other | Admitting: Cardiology

## 2020-11-14 DIAGNOSIS — M0579 Rheumatoid arthritis with rheumatoid factor of multiple sites without organ or systems involvement: Secondary | ICD-10-CM | POA: Diagnosis not present

## 2020-11-21 NOTE — Progress Notes (Signed)
Electrophysiology Office Note Date: 11/22/2020  ID:  Diamante, Smalls [birth year], MRN [id number]  PCP: [name], MD Primary Cardiologist: Peter Martinique, MD Electrophysiologist: [name], MD   CC: Pacemaker follow-up  Elizabeth Estrada is a 80 y.o. female seen today for Will Meredith Leeds, MD for routine electrophysiology followup.  Since last being seen in our clinic the patient reports doing very well.  she denies chest pain, palpitations, dyspnea, PND, orthopnea, nausea, vomiting, dizziness, syncope, edema, weight gain, or early satiety.  Device History: Medtronic Dual Chamber PPM implanted 07/2017, lead revision 10/2017 for CHB  Past Medical History:  Diagnosis Date   AV block    a. s/p MDT dual chamber PPM (HIS bundle lead)   Chronic depression    Coronary artery disease    Family history of adverse reaction to anesthesia    "dad had PONV"   Fibromyalgia    GERD (gastroesophageal reflux disease)    Heart murmur    History of kidney stones    Hypertension    "before stent and pacemaker" (10/13/2017)   Melanoma ([place])    removed from back   MVP (mitral valve prolapse)    Osteoarthritis    Osteoporosis    Presence of permanent cardiac pacemaker 08/08/2017   RA (rheumatoid arthritis) ([place])    "all over; mainly hands and knees" (10/13/2017)   Rib fracture    Past Surgical History:  Procedure Laterality Date   BREAST BIOPSY Left 11/2003   benign - Dr. Margot Chimes   CARDIAC CATHETERIZATION     CARDIOVASCULAR STRESS TEST  10/19/1999, 2013   EF 97%, NO EVIDENCE OF ISCHEMIA, 2013 NORMAL   CATARACT EXTRACTION W/ INTRAOCULAR LENS  IMPLANT, BILATERAL Bilateral    COLONOSCOPY     CORONARY STENT INTERVENTION N/A 08/08/2017   Procedure: CORONARY STENT INTERVENTION;  Surgeon: [name], MD;  Location: [facility];  Service: Cardiovascular;  Laterality: N/A;   DILATATION & CURRETTAGE/HYSTEROSCOPY WITH RESECTOCOPE N/A 07/06/2013   Procedure: DILATATION &  CURETTAGE/HYSTEROSCOPY WITH RESECTOCOPE with removal of endometrial lesion;  Surgeon: [name], MD;  Location: [facility];  Service: Gynecology;  Laterality: N/A;   LEAD REVISION/REPAIR N/A 10/13/2017   Procedure: LEAD REVISION/REPAIR;  Surgeon: [name], MD;  Location: [facility];  Service: Cardiovascular;  Laterality: N/A;   MELANOMA EXCISION  1990s?   from back   PACEMAKER IMPLANT N/A 08/11/2017   Procedure: PACEMAKER IMPLANT;  Surgeon: [name], MD;  Location: [facility];  Service: Cardiovascular;  Laterality: N/A;   RIGHT/LEFT HEART CATH AND CORONARY ANGIOGRAPHY N/A 08/08/2017   Procedure: RIGHT/LEFT HEART CATH AND CORONARY ANGIOGRAPHY;  Surgeon: [name], MD;  Location: [facility];  Service: Cardiovascular;  Laterality: N/A;    Current Outpatient Medications  Medication Sig Dispense Refill   abatacept (ORENCIA) 250 MG injection Inject 250 mg into the vein every 30 (thirty) days.      aspirin 81 MG tablet Take 81 mg by mouth daily.     atorvastatin (LIPITOR) 80 MG tablet Take 40 mg by mouth daily.     cetirizine (ZYRTEC) 10 MG tablet Take 10 mg by mouth daily as needed for allergies.     escitalopram (LEXAPRO) 20 MG tablet Take 20 mg by mouth at bedtime.      fluticasone (FLONASE) 50 MCG/ACT nasal spray Place 1-2 sprays into both nostrils daily as needed for allergies or rhinitis.     Polyethyl  Glycol-Propyl Glycol (SYSTANE OP) Apply to eye.     ibuprofen (ADVIL) 200 MG tablet as needed.     Multiple Vitamins-Minerals (PRESERVISION AREDS 2) CAPS as directed.     No current facility-administered medications for this visit.    Allergies:   Methotrexate derivatives, Plaquenil [hydroxychloroquine sulfate], Remicade [infliximab], Ridaura [auranofin], and Sudafed [pseudoephedrine]   Social History: Social History   Socioeconomic History   Marital status: Married    Spouse name: Not on file   Number of children: 0   Years of  education: Not on file   Highest education level: High school graduate  Occupational History   Occupation: Retired  Tobacco Use   Smoking status: Never   Smokeless tobacco: Never  Scientific laboratory technician Use: Never used  Substance and Sexual Activity   Alcohol use: Never   Drug use: Never   Sexual activity: Not on file  Other Topics Concern   Not on file  Social History Narrative   Not on file   Social Determinants of Health   Financial Resource Strain: Not on file  Food Insecurity: Not on file  Transportation Needs: Not on file  Physical Activity: Not on file  Stress: Not on file  Social Connections: Not on file  Intimate Partner Violence: Not on file    Family History: Family History  Problem Relation Age of Onset   Coronary artery disease Mother    Pancreatic cancer Father    Heart disease Father        bypass   Hypertension Father    CAD Brother 63       CABG and AVR     Review of Systems: All other systems reviewed and are otherwise negative except as noted above.  Physical Exam: Vitals:   11/22/20 1055  BP: (!) 132/58  Pulse: 74  SpO2: 97%  Weight: 108 lb 9.6 oz (49.3 kg)  Height: '5\' 2"'$  (1.575 m)     GEN- The patient is well appearing, alert and oriented x 3 today.   HEENT: normocephalic, atraumatic; sclera clear, conjunctiva pink; hearing intact; oropharynx clear; neck supple  Lungs- Clear to ausculation bilaterally, normal work of breathing.  No wheezes, rales, rhonchi Heart- Regular rate and rhythm, no murmurs, rubs or gallops  GI- soft, non-tender, non-distended, bowel sounds present  Extremities- no clubbing or cyanosis. No edema MS- no significant deformity or atrophy Skin- warm and dry, no rash or lesion; PPM pocket well healed Psych- euthymic mood, full affect Neuro- strength and sensation are intact  PPM Interrogation- reviewed in detail today,  See PACEART report  EKG:  EKG is not ordered today.  Recent Labs: 07/27/2020: ALT 18   Wt  Readings from Last 3 Encounters:  11/22/20 108 lb 9.6 oz (49.3 kg)  10/20/20 109 lb 6.4 oz (49.6 kg)  07/26/20 108 lb (49 kg)     Other studies Reviewed: Additional studies/ records that were reviewed today include: Previous EP office notes, Previous remote checks, Most recent labwork.   Assessment and Plan:  1.  Complete heart block: Status post Medtronic dual-chamber pacemaker implanted 08/11/2017.  Lead revision due to increasing thresholds 10/13/2017.   Stable device function No changes today. See PACEART    2.  Coronary artery disease: Denies ischemic symptoms. Treating medically per Dr. Martinique Status post RCA stent in 2019.  Currently on aspirin and Plavix.     3.  Atrial tachycardia:  Minimal noted on pacemaker interrogation. 1 episode ~ 53 minutes. Does not  appear to be AF. If so, would be considered SCAF with no h/o CVA.   Current medicines are reviewed at length with the patient today.   The patient does not have concerns regarding her medicines.  The following changes were made today:  none  Labs/ tests ordered today include:  No orders of the defined types were placed in this encounter.  Disposition:   Follow up with Dr. Curt Bears in 12 Months    Signed, Shirley Friar, PA-C  11/22/2020 11:19 AM  [facility] [address] [place] [address] [phone] (office) [phone] (fax)

## 2020-11-22 ENCOUNTER — Ambulatory Visit (INDEPENDENT_AMBULATORY_CARE_PROVIDER_SITE_OTHER): Payer: Medicare Other | Admitting: Student

## 2020-11-22 ENCOUNTER — Other Ambulatory Visit: Payer: Self-pay

## 2020-11-22 ENCOUNTER — Encounter: Payer: Self-pay | Admitting: Student

## 2020-11-22 VITALS — BP 132/58 | HR 74 | Ht 62.0 in | Wt 108.6 lb

## 2020-11-22 DIAGNOSIS — I2510 Atherosclerotic heart disease of native coronary artery without angina pectoris: Secondary | ICD-10-CM | POA: Diagnosis not present

## 2020-11-22 DIAGNOSIS — I471 Supraventricular tachycardia: Secondary | ICD-10-CM | POA: Diagnosis not present

## 2020-11-22 DIAGNOSIS — I441 Atrioventricular block, second degree: Secondary | ICD-10-CM | POA: Diagnosis not present

## 2020-11-22 DIAGNOSIS — Z9861 Coronary angioplasty status: Secondary | ICD-10-CM | POA: Diagnosis not present

## 2020-11-22 LAB — CUP PACEART INCLINIC DEVICE CHECK
Battery Remaining Longevity: 91 mo
Battery Voltage: 2.98 V
Brady Statistic AP VP Percent: 32.21 %
Brady Statistic AP VS Percent: 0.1 %
Brady Statistic AS VP Percent: 67.48 %
Brady Statistic AS VS Percent: 0.21 %
Brady Statistic RA Percent Paced: 32.18 %
Brady Statistic RV Percent Paced: 99.69 %
Date Time Interrogation Session: 20220810111741
Implantable Lead Implant Date: 20190429
Implantable Lead Implant Date: 20190429
Implantable Lead Location: 753859
Implantable Lead Location: 753860
Implantable Lead Model: 5076
Implantable Lead Model: 5076
Implantable Pulse Generator Implant Date: 20190429
Lead Channel Impedance Value: 323 Ohm
Lead Channel Impedance Value: 342 Ohm
Lead Channel Impedance Value: 380 Ohm
Lead Channel Impedance Value: 475 Ohm
Lead Channel Pacing Threshold Amplitude: 0.625 V
Lead Channel Pacing Threshold Amplitude: 0.75 V
Lead Channel Pacing Threshold Pulse Width: 0.4 ms
Lead Channel Pacing Threshold Pulse Width: 0.4 ms
Lead Channel Sensing Intrinsic Amplitude: 18.125 mV
Lead Channel Sensing Intrinsic Amplitude: 2.75 mV
Lead Channel Sensing Intrinsic Amplitude: 22 mV
Lead Channel Sensing Intrinsic Amplitude: 3.125 mV
Lead Channel Setting Pacing Amplitude: 1.5 V
Lead Channel Setting Pacing Amplitude: 2.5 V
Lead Channel Setting Pacing Pulse Width: 0.4 ms
Lead Channel Setting Sensing Sensitivity: 1.2 mV

## 2020-11-22 NOTE — Patient Instructions (Signed)
Medication Instructions:  Your physician recommends that you continue on your current medications as directed. Please refer to the Current Medication list given to you today.  *If you need a refill on your cardiac medications before your next appointment, please call your pharmacy*   Lab Work: None If you have labs (blood work) drawn today and your tests are completely normal, you will receive your results only by: [place] (if you have MyChart) OR A paper copy in the mail If you have any lab test that is abnormal or we need to change your treatment, we will call you to review the results.  Follow-Up: At [facility], you and your health needs are our priority.  As part of our continuing mission to provide you with exceptional heart care, we have created designated Provider Care Teams.  These Care Teams include your primary Cardiologist (physician) and Advanced Practice Providers (APPs -  Physician Assistants and Nurse Practitioners) who all work together to provide you with the care you need, when you need it.   Your next appointment:   1 year(s)  The format for your next appointment:   In Person  Provider:   You may see Will Meredith Leeds, MD or one of the following Advanced Practice Providers on your designated Care Team:   Tommye Standard, [place] Legrand Como "[facility]" [place], [place]

## 2020-12-13 DIAGNOSIS — M0579 Rheumatoid arthritis with rheumatoid factor of multiple sites without organ or systems involvement: Secondary | ICD-10-CM | POA: Diagnosis not present

## 2021-01-01 DIAGNOSIS — Z1231 Encounter for screening mammogram for malignant neoplasm of breast: Secondary | ICD-10-CM | POA: Diagnosis not present

## 2021-01-10 DIAGNOSIS — M0579 Rheumatoid arthritis with rheumatoid factor of multiple sites without organ or systems involvement: Secondary | ICD-10-CM | POA: Diagnosis not present

## 2021-01-16 ENCOUNTER — Ambulatory Visit (INDEPENDENT_AMBULATORY_CARE_PROVIDER_SITE_OTHER): Payer: Medicare Other

## 2021-01-16 DIAGNOSIS — D225 Melanocytic nevi of trunk: Secondary | ICD-10-CM | POA: Diagnosis not present

## 2021-01-16 DIAGNOSIS — I441 Atrioventricular block, second degree: Secondary | ICD-10-CM

## 2021-01-16 DIAGNOSIS — D1801 Hemangioma of skin and subcutaneous tissue: Secondary | ICD-10-CM | POA: Diagnosis not present

## 2021-01-16 DIAGNOSIS — L853 Xerosis cutis: Secondary | ICD-10-CM | POA: Diagnosis not present

## 2021-01-16 DIAGNOSIS — Z85820 Personal history of malignant melanoma of skin: Secondary | ICD-10-CM | POA: Diagnosis not present

## 2021-01-16 DIAGNOSIS — L819 Disorder of pigmentation, unspecified: Secondary | ICD-10-CM | POA: Diagnosis not present

## 2021-01-16 DIAGNOSIS — D224 Melanocytic nevi of scalp and neck: Secondary | ICD-10-CM | POA: Diagnosis not present

## 2021-01-16 DIAGNOSIS — L821 Other seborrheic keratosis: Secondary | ICD-10-CM | POA: Diagnosis not present

## 2021-01-16 LAB — CUP PACEART REMOTE DEVICE CHECK
Battery Remaining Longevity: 89 mo
Battery Voltage: 2.98 V
Brady Statistic AP VP Percent: 34.19 %
Brady Statistic AP VS Percent: 0.02 %
Brady Statistic AS VP Percent: 65.71 %
Brady Statistic AS VS Percent: 0.08 %
Brady Statistic RA Percent Paced: 34.11 %
Brady Statistic RV Percent Paced: 99.9 %
Date Time Interrogation Session: 20221003202915
Implantable Lead Implant Date: 20190429
Implantable Lead Implant Date: 20190429
Implantable Lead Location: 753859
Implantable Lead Location: 753860
Implantable Lead Model: 5076
Implantable Lead Model: 5076
Implantable Pulse Generator Implant Date: 20190429
Lead Channel Impedance Value: 323 Ohm
Lead Channel Impedance Value: 361 Ohm
Lead Channel Impedance Value: 380 Ohm
Lead Channel Impedance Value: 475 Ohm
Lead Channel Pacing Threshold Amplitude: 0.625 V
Lead Channel Pacing Threshold Amplitude: 0.625 V
Lead Channel Pacing Threshold Pulse Width: 0.4 ms
Lead Channel Pacing Threshold Pulse Width: 0.4 ms
Lead Channel Sensing Intrinsic Amplitude: 2.75 mV
Lead Channel Sensing Intrinsic Amplitude: 2.75 mV
Lead Channel Sensing Intrinsic Amplitude: 20.375 mV
Lead Channel Sensing Intrinsic Amplitude: 20.375 mV
Lead Channel Setting Pacing Amplitude: 1.5 V
Lead Channel Setting Pacing Amplitude: 2.5 V
Lead Channel Setting Pacing Pulse Width: 0.4 ms
Lead Channel Setting Sensing Sensitivity: 1.2 mV

## 2021-01-24 DIAGNOSIS — Z23 Encounter for immunization: Secondary | ICD-10-CM | POA: Diagnosis not present

## 2021-01-24 NOTE — Progress Notes (Signed)
Remote pacemaker transmission.   

## 2021-02-02 DIAGNOSIS — I25119 Atherosclerotic heart disease of native coronary artery with unspecified angina pectoris: Secondary | ICD-10-CM | POA: Diagnosis not present

## 2021-02-02 DIAGNOSIS — K219 Gastro-esophageal reflux disease without esophagitis: Secondary | ICD-10-CM | POA: Diagnosis not present

## 2021-02-02 DIAGNOSIS — E7800 Pure hypercholesterolemia, unspecified: Secondary | ICD-10-CM | POA: Diagnosis not present

## 2021-02-02 DIAGNOSIS — F3340 Major depressive disorder, recurrent, in remission, unspecified: Secondary | ICD-10-CM | POA: Diagnosis not present

## 2021-02-02 DIAGNOSIS — Z0000 Encounter for general adult medical examination without abnormal findings: Secondary | ICD-10-CM | POA: Diagnosis not present

## 2021-02-02 DIAGNOSIS — Z950 Presence of cardiac pacemaker: Secondary | ICD-10-CM | POA: Diagnosis not present

## 2021-02-02 DIAGNOSIS — I442 Atrioventricular block, complete: Secondary | ICD-10-CM | POA: Diagnosis not present

## 2021-02-02 DIAGNOSIS — Z1389 Encounter for screening for other disorder: Secondary | ICD-10-CM | POA: Diagnosis not present

## 2021-02-02 DIAGNOSIS — M1990 Unspecified osteoarthritis, unspecified site: Secondary | ICD-10-CM | POA: Diagnosis not present

## 2021-02-02 DIAGNOSIS — M069 Rheumatoid arthritis, unspecified: Secondary | ICD-10-CM | POA: Diagnosis not present

## 2021-02-02 DIAGNOSIS — I208 Other forms of angina pectoris: Secondary | ICD-10-CM | POA: Diagnosis not present

## 2021-02-07 DIAGNOSIS — M0579 Rheumatoid arthritis with rheumatoid factor of multiple sites without organ or systems involvement: Secondary | ICD-10-CM | POA: Diagnosis not present

## 2021-02-07 DIAGNOSIS — Z111 Encounter for screening for respiratory tuberculosis: Secondary | ICD-10-CM | POA: Diagnosis not present

## 2021-02-07 DIAGNOSIS — Z79899 Other long term (current) drug therapy: Secondary | ICD-10-CM | POA: Diagnosis not present

## 2021-02-07 DIAGNOSIS — R5383 Other fatigue: Secondary | ICD-10-CM | POA: Diagnosis not present

## 2021-02-14 ENCOUNTER — Other Ambulatory Visit: Payer: Self-pay

## 2021-02-14 MED ORDER — ATORVASTATIN CALCIUM 40 MG PO TABS: 40.0000 mg | ORAL_TABLET | Freq: Every day | ORAL | 2 refills | Status: DC

## 2021-03-07 DIAGNOSIS — M0579 Rheumatoid arthritis with rheumatoid factor of multiple sites without organ or systems involvement: Secondary | ICD-10-CM | POA: Diagnosis not present

## 2021-03-18 DIAGNOSIS — R5383 Other fatigue: Secondary | ICD-10-CM | POA: Diagnosis not present

## 2021-03-18 DIAGNOSIS — J069 Acute upper respiratory infection, unspecified: Secondary | ICD-10-CM | POA: Diagnosis not present

## 2021-03-18 DIAGNOSIS — Z03818 Encounter for observation for suspected exposure to other biological agents ruled out: Secondary | ICD-10-CM | POA: Diagnosis not present

## 2021-03-18 DIAGNOSIS — R0989 Other specified symptoms and signs involving the circulatory and respiratory systems: Secondary | ICD-10-CM | POA: Diagnosis not present

## 2021-03-18 DIAGNOSIS — R197 Diarrhea, unspecified: Secondary | ICD-10-CM | POA: Diagnosis not present

## 2021-04-04 DIAGNOSIS — Z79899 Other long term (current) drug therapy: Secondary | ICD-10-CM | POA: Diagnosis not present

## 2021-04-04 DIAGNOSIS — M0579 Rheumatoid arthritis with rheumatoid factor of multiple sites without organ or systems involvement: Secondary | ICD-10-CM | POA: Diagnosis not present

## 2021-04-17 ENCOUNTER — Ambulatory Visit (INDEPENDENT_AMBULATORY_CARE_PROVIDER_SITE_OTHER): Payer: Medicare Other

## 2021-04-17 DIAGNOSIS — I441 Atrioventricular block, second degree: Secondary | ICD-10-CM | POA: Diagnosis not present

## 2021-04-17 LAB — CUP PACEART REMOTE DEVICE CHECK
Battery Remaining Longevity: 85 mo
Battery Voltage: 2.98 V
Brady Statistic AP VP Percent: 16.16 %
Brady Statistic AP VS Percent: 0.02 %
Brady Statistic AS VP Percent: 83.64 %
Brady Statistic AS VS Percent: 0.18 %
Brady Statistic RA Percent Paced: 16.1 %
Brady Statistic RV Percent Paced: 99.8 %
Date Time Interrogation Session: 20230103041447
Implantable Lead Implant Date: 20190429
Implantable Lead Implant Date: 20190429
Implantable Lead Location: 753859
Implantable Lead Location: 753860
Implantable Lead Model: 5076
Implantable Lead Model: 5076
Implantable Pulse Generator Implant Date: 20190429
Lead Channel Impedance Value: 304 Ohm
Lead Channel Impedance Value: 342 Ohm
Lead Channel Impedance Value: 361 Ohm
Lead Channel Impedance Value: 456 Ohm
Lead Channel Pacing Threshold Amplitude: 0.625 V
Lead Channel Pacing Threshold Amplitude: 0.625 V
Lead Channel Pacing Threshold Pulse Width: 0.4 ms
Lead Channel Pacing Threshold Pulse Width: 0.4 ms
Lead Channel Sensing Intrinsic Amplitude: 13.375 mV
Lead Channel Sensing Intrinsic Amplitude: 13.375 mV
Lead Channel Sensing Intrinsic Amplitude: 2.25 mV
Lead Channel Sensing Intrinsic Amplitude: 2.25 mV
Lead Channel Setting Pacing Amplitude: 1.5 V
Lead Channel Setting Pacing Amplitude: 2.5 V
Lead Channel Setting Pacing Pulse Width: 0.4 ms
Lead Channel Setting Sensing Sensitivity: 1.2 mV

## 2021-04-19 DIAGNOSIS — Z23 Encounter for immunization: Secondary | ICD-10-CM | POA: Diagnosis not present

## 2021-04-26 NOTE — Progress Notes (Signed)
Remote pacemaker transmission.   

## 2021-04-27 DIAGNOSIS — Z681 Body mass index (BMI) 19 or less, adult: Secondary | ICD-10-CM | POA: Diagnosis not present

## 2021-04-27 DIAGNOSIS — M0579 Rheumatoid arthritis with rheumatoid factor of multiple sites without organ or systems involvement: Secondary | ICD-10-CM | POA: Diagnosis not present

## 2021-04-27 DIAGNOSIS — M150 Primary generalized (osteo)arthritis: Secondary | ICD-10-CM | POA: Diagnosis not present

## 2021-04-27 DIAGNOSIS — M5136 Other intervertebral disc degeneration, lumbar region: Secondary | ICD-10-CM | POA: Diagnosis not present

## 2021-04-27 DIAGNOSIS — M810 Age-related osteoporosis without current pathological fracture: Secondary | ICD-10-CM | POA: Diagnosis not present

## 2021-04-27 DIAGNOSIS — M25461 Effusion, right knee: Secondary | ICD-10-CM | POA: Diagnosis not present

## 2021-05-02 DIAGNOSIS — M0579 Rheumatoid arthritis with rheumatoid factor of multiple sites without organ or systems involvement: Secondary | ICD-10-CM | POA: Diagnosis not present

## 2021-05-22 DIAGNOSIS — H9201 Otalgia, right ear: Secondary | ICD-10-CM | POA: Diagnosis not present

## 2021-05-30 DIAGNOSIS — M0579 Rheumatoid arthritis with rheumatoid factor of multiple sites without organ or systems involvement: Secondary | ICD-10-CM | POA: Diagnosis not present

## 2021-05-31 DIAGNOSIS — H40053 Ocular hypertension, bilateral: Secondary | ICD-10-CM | POA: Diagnosis not present

## 2021-05-31 DIAGNOSIS — Z961 Presence of intraocular lens: Secondary | ICD-10-CM | POA: Diagnosis not present

## 2021-05-31 DIAGNOSIS — H26493 Other secondary cataract, bilateral: Secondary | ICD-10-CM | POA: Diagnosis not present

## 2021-05-31 DIAGNOSIS — H04123 Dry eye syndrome of bilateral lacrimal glands: Secondary | ICD-10-CM | POA: Diagnosis not present

## 2021-06-26 DIAGNOSIS — Z20822 Contact with and (suspected) exposure to covid-19: Secondary | ICD-10-CM | POA: Diagnosis not present

## 2021-07-02 DIAGNOSIS — M0579 Rheumatoid arthritis with rheumatoid factor of multiple sites without organ or systems involvement: Secondary | ICD-10-CM | POA: Diagnosis not present

## 2021-07-12 DIAGNOSIS — H40053 Ocular hypertension, bilateral: Secondary | ICD-10-CM | POA: Diagnosis not present

## 2021-07-17 ENCOUNTER — Ambulatory Visit (INDEPENDENT_AMBULATORY_CARE_PROVIDER_SITE_OTHER): Payer: Medicare Other

## 2021-07-17 DIAGNOSIS — I441 Atrioventricular block, second degree: Secondary | ICD-10-CM

## 2021-07-17 LAB — CUP PACEART REMOTE DEVICE CHECK
Battery Remaining Longevity: 81 mo
Battery Voltage: 2.98 V
Brady Statistic AP VP Percent: 22.92 %
Brady Statistic AP VS Percent: 0 %
Brady Statistic AS VP Percent: 77.05 %
Brady Statistic AS VS Percent: 0.02 %
Brady Statistic RA Percent Paced: 22.82 %
Brady Statistic RV Percent Paced: 99.97 %
Date Time Interrogation Session: 20230404032415
Implantable Lead Implant Date: 20190429
Implantable Lead Implant Date: 20190429
Implantable Lead Location: 753859
Implantable Lead Location: 753860
Implantable Lead Model: 5076
Implantable Lead Model: 5076
Implantable Pulse Generator Implant Date: 20190429
Lead Channel Impedance Value: 323 Ohm
Lead Channel Impedance Value: 323 Ohm
Lead Channel Impedance Value: 361 Ohm
Lead Channel Impedance Value: 437 Ohm
Lead Channel Pacing Threshold Amplitude: 0.625 V
Lead Channel Pacing Threshold Amplitude: 0.625 V
Lead Channel Pacing Threshold Pulse Width: 0.4 ms
Lead Channel Pacing Threshold Pulse Width: 0.4 ms
Lead Channel Sensing Intrinsic Amplitude: 1.125 mV
Lead Channel Sensing Intrinsic Amplitude: 1.125 mV
Lead Channel Sensing Intrinsic Amplitude: 23.125 mV
Lead Channel Sensing Intrinsic Amplitude: 23.125 mV
Lead Channel Setting Pacing Amplitude: 1.5 V
Lead Channel Setting Pacing Amplitude: 2.5 V
Lead Channel Setting Pacing Pulse Width: 0.4 ms
Lead Channel Setting Sensing Sensitivity: 1.2 mV

## 2021-07-30 DIAGNOSIS — M0579 Rheumatoid arthritis with rheumatoid factor of multiple sites without organ or systems involvement: Secondary | ICD-10-CM | POA: Diagnosis not present

## 2021-07-31 NOTE — Progress Notes (Signed)
Remote pacemaker transmission.   

## 2021-08-11 DIAGNOSIS — S8992XA Unspecified injury of left lower leg, initial encounter: Secondary | ICD-10-CM | POA: Diagnosis not present

## 2021-08-12 NOTE — Progress Notes (Signed)
Cardiology Office Note   Date:  08/24/2021   ID:  Elizabeth Estrada Feb 08, 1941, MRN [id number]  PCP:  Blair Heys, MD  Cardiologist:   Kiaja Shorty [place], MD   Chief Complaint  Patient presents with   Coronary Artery Disease       History of Present Illness: Elizabeth Estrada is a 81 y.o. female who is seen for follow up CAD and complete heart block.   She has a history of MVP/MS and family history of CAD. She has a history of murmur. Echo in 2006 mentions mild prolapse but in 2013 she had an Echo showing a small pericardial effusion otherwise normal. No prolapse seen. She also had a normal stress Echo at that time.   She was seen in January 2019 with some palpitations. Seen again in April with symptoms of exertional chest pressure. Echo showed mild to moderate mitral stenosis. ETT was done and demonstrated very poor exercise tolerance with hypertensive BP response. There was 2:1 AV block noted. An event monitor showed episodes of complete heart block. She underwent right and left heart cath. This showed an 80% ostial RCA stenosis and 70% diagonal. There was mild pulmonary HTN without significant mitral stenosis. EDP was elevated c/w diastolic dysfunction. She underwent stenting of the ostial RCA with DES. She was placed on DAPT. Post stent she had persistent episodes of CHB despite correcting ischemia and underwent DDD pacemaker placement on 08/11/17.    She was seen by Corine Shelter PA in August 2019. Had persistent SOB. Repeat Echo showed no change. 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 Also has moderate mitral stenosis.   Pacemaker check on 07/17/21 was normal.   On follow up today she is feeling well from a cardiac standpoint. She was riding a stationary bike some but fell a couple of weeks ago and fractured her left patella. Also had eye surgery for glaucoma. Denies SOB or palpitations. No edema. Weight is stable.    Past Medical History:  Diagnosis Date    AV block    a. 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 Jamey Ripa   CARDIAC CATHETERIZATION     CARDIOVASCULAR STRESS TEST  10/19/1999, 2013   EF 97%, NO EVIDENCE OF ISCHEMIA, 2013 NORMAL   CATARACT EXTRACTION W/ INTRAOCULAR LENS  IMPLANT, BILATERAL Bilateral    COLONOSCOPY     CORONARY STENT INTERVENTION N/A 08/08/2017   Procedure: CORONARY STENT INTERVENTION;  Surgeon: Lyn Records, MD;  Location: [facility];  Service: Cardiovascular;  Laterality: N/A;   DILATATION & CURRETTAGE/HYSTEROSCOPY WITH RESECTOCOPE N/A 07/06/2013   Procedure: DILATATION & CURETTAGE/HYSTEROSCOPY WITH RESECTOCOPE with removal of endometrial lesion;  Surgeon: Marda Stalker  Hyacinth Meeker, MD;  Location: [facility];  Service: Gynecology;  Laterality: N/A;   LEAD REVISION/REPAIR N/A 10/13/2017   Procedure: LEAD REVISION/REPAIR;  Surgeon: Regan Lemming, MD;  Location: [facility];  Service: Cardiovascular;  Laterality: N/A;   MELANOMA EXCISION  1990s?   from back   PACEMAKER IMPLANT N/A 08/11/2017   Procedure: PACEMAKER IMPLANT;  Surgeon: Regan Lemming, MD;  Location: [facility];  Service: Cardiovascular;  Laterality: N/A;   RIGHT/LEFT HEART CATH AND CORONARY ANGIOGRAPHY N/A 08/08/2017   Procedure: RIGHT/LEFT HEART CATH AND CORONARY ANGIOGRAPHY;  Surgeon: Lyn Records, MD;  Location: [facility];  Service: Cardiovascular;  Laterality: N/A;     Current Outpatient Medications  Medication Sig Dispense Refill   abatacept (ORENCIA) 250 MG injection Inject 250 mg into the vein every 30 (thirty) days.      aspirin 81 MG tablet Take 81 mg by mouth daily.     atorvastatin (LIPITOR) 40 MG tablet Take 1 tablet (40 mg total) by mouth daily. 90 tablet 2   cetirizine (ZYRTEC) 10 MG tablet Take 10 mg by mouth daily as needed for allergies.     dorzolamide-timolol (COSOPT) 22.3-6.8 MG/ML ophthalmic solution 1 drop 2 (two) times daily.     escitalopram (LEXAPRO) 20 MG tablet Take 20 mg by mouth at bedtime.      fluticasone (FLONASE) 50 MCG/ACT nasal spray Place 1-2 sprays into both nostrils daily as needed for allergies or rhinitis.     latanoprost (XALATAN) 0.005 % ophthalmic solution SMARTSIG:1 Drop(s) In Eye(s)     Multiple Vitamins-Minerals (PRESERVISION AREDS 2) CAPS as directed.     Polyethyl Glycol-Propyl Glycol (SYSTANE OP) Apply to eye.     ibuprofen (ADVIL) 200 MG tablet as needed. (Patient not taking: Reported on 08/24/2021)     No current facility-administered medications for this visit.    Allergies:   Methotrexate derivatives, Plaquenil [hydroxychloroquine sulfate], Remicade [infliximab], Ridaura [auranofin], and Sudafed [pseudoephedrine]    Social History:  The patient  reports that she has never smoked. She has never used smokeless tobacco. She reports that she does not drink alcohol and does not use drugs.   Family History:  The patient's family history includes CAD (age of onset: 53) in her brother; Coronary artery disease in her mother; Heart disease in her father; Hypertension in her father; Pancreatic cancer in her father.    ROS:  Please see the history of present illness.  . Otherwise, review of systems are positive for none.   All other systems are reviewed and negative.    PHYSICAL EXAM: VS:  BP 120/70   Pulse 60   Ht 5\' 2"   (1.575 m)   Wt 113 lb 6.4 oz (51.4 kg)   LMP 04/15/1990   SpO2 95%   BMI 20.74 kg/m  , BMI Body mass index is 20.74 kg/m. GENERAL:  Well appearing WF in NAD HEENT:  PERRL, EOMI, sclera are clear. Oropharynx is clear. NECK:  No jugular venous distention, carotid upstroke brisk and symmetric, no bruits, no thyromegaly or adenopathy LUNGS:  Clear to auscultation bilaterally CHEST:  Unremarkable HEART:  RRR,  PMI not displaced or sustained,S1 and S2 within normal limits, no S3, no S4: no clicks, no rubs, soft 1/6 systolic murmur at the apex ABD:  Soft, nontender. BS +, no masses or bruits. No hepatomegaly, no splenomegaly EXT:  2 + pulses throughout, no edema, no cyanosis no clubbing SKIN:  Warm and dry.  No  rashes NEURO:  Alert and oriented x 3. Cranial nerves II through XII intact. PSYCH:  Cognitively intact  EKG:  EKG is  ordered today. AV sequential pacing rate 60. I have personally reviewed and interpreted this study.    Recent Labs: Lab Results  Component Value Date   WBC 8.8 09/23/2018   HGB 13.2 09/23/2018   HCT 41.1 09/23/2018   PLT 165 09/23/2018   GLUCOSE 86 10/14/2019   CHOL 161 07/27/2020   TRIG 34 07/27/2020   HDL 87 07/27/2020   LDLCALC 66 07/27/2020   ALT 18 07/27/2020   AST 33 07/27/2020   NA 139 10/14/2019   K 4.4 10/14/2019   CL 103 10/14/2019   CREATININE 0.87 10/14/2019   BUN 15 10/14/2019   CO2 26 10/14/2019   TSH 1.610 07/14/2017   INR 1.03 08/13/2017     Lipid Panel    Component Value Date/Time   CHOL 161 07/27/2020 1143   TRIG 34 07/27/2020 1143   HDL 87 07/27/2020 1143   CHOLHDL 1.9 07/27/2020 1143   LDLCALC 66 07/27/2020 1143      Wt Readings from Last 3 Encounters:  08/24/21 113 lb 6.4 oz (51.4 kg)  11/22/20 108 lb 9.6 oz (49.3 kg)  10/20/20 109 lb 6.4 oz (49.6 kg)      Other studies Reviewed: Additional studies/ records that were reviewed today include: Event monitor 07/24/17: Study Highlights   Normal sinus rhythm Periods  of intermittent complete heart block and Type 2 second degree AV block with 2:1 conduction   Echo 07/31/17: Study Conclusions   - Left ventricle: The cavity size was normal. Wall thickness was   normal. Systolic function was normal. The estimated ejection   fraction was in the range of 60% to 65%. Wall motion was normal;   there were no regional wall motion abnormalities. Doppler   parameters are consistent with abnormal left ventricular   relaxation (grade 1 diastolic dysfunction). - Aortic valve: There was no stenosis. - Mitral valve: The findings are consistent with moderate stenosis.   There was no significant regurgitation. Mean gradient (D): 8 mm   Hg. Valve area by pressure half-time: 1.49 cm^2. - Left atrium: The atrium was moderately dilated. - Right ventricle: The cavity size was normal. Systolic function   was normal. - Tricuspid valve: Peak RV-RA gradient (S): 26 mm Hg. - Pulmonary arteries: PA peak pressure: 29 mm Hg (S). - Inferior vena cava: The vessel was normal in size. The   respirophasic diameter changes were in the normal range (>= 50%),   consistent with normal central venous pressure. - Pericardium, extracardiac: Small to moderate pericardial   effusion. < 25% respirophasic variation of mitral E inflow   velocity. IVC is not dilated. No significant RV diastolic   collapse. No pericardial tamponade.   Impressions:   - Normal LV size with EF 60-65%. Normal RV size and systolic   function. 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 There was a small to moderate pericardial   effusion without tamponade.  ETT 08/05/17: Study Highlights     Blood pressure demonstrated a hypertensive response to exercise. There was no ST segment deviation noted during stress.   Abnormal ETT with poor exercise tolerance (1:01); pt complained of chest discomfort during the study; hypertensive BP response; pt developed 2:1 AV block with exertion  (that correlated with chest pain) that resolved in recovery; no ST changes. Pt reviewed with Dr Elberta Fortis prior to DC.  Cardiac cath/PCI: 08/08/17: Conclusion   80% ostial RCA.  The RCA is dominant. 30 to 40% proximal and mid LAD.  70% proximal diagonal #1. Widely patent circumflex with tortuous obtuse marginals. Normal left main Calcified mitral valve and annulus.  No significant mitral stenosis is noted. Mild pulmonary hypertension Normal left ventricular systolic function with elevated end-diastolic and pulmonary wedge pressure in the 20 to 22 mmHg range consistent with diastolic heart failure, chronic. Successful PCI and stent of the ostial RCA from 80% with TIMI grade III flow to less than 10% with TIMI grade III flow using a 3.0 x 12 mm Synergy postdilated to 3.5 mmHg pressure.   RECOMMENDATIONS:   Aspirin and Plavix for at least 6 months. Pacemaker may be needed if continued prolonged episodes of second-degree heart block with bradycardia. Aggressive risk factor modification. Probable discharge in a.m. if no complications.      Pacemaker implant 08/11/17: CONCLUSIONS:   1. Successful implantation of a Medtronic Azure XT DR MRI SureScan dual-chamber pacemaker for symptomatic bradycardia  2. No early apparent complications.     Echo 12/05/17: Study Conclusions   - Left ventricle: The cavity size was normal. Systolic function was   vigorous. The estimated ejection fraction was in the range of 65%   to 70%. Wall motion was normal; there were no regional wall   motion abnormalities. Doppler parameters are consistent with   abnormal left ventricular relaxation (grade 1 diastolic   dysfunction). Doppler parameters are consistent with elevated   ventricular end-diastolic filling pressure. - Mitral valve: Moderately thickened, moderately calcified leaflets   . The findings are consistent with moderate stenosis. There was   mild regurgitation. Mean gradient (D): 7 mm Hg. Valve area  by   continuity equation (using LVOT flow): 1.17 cm^2. - Left atrium: The atrium was mildly dilated. - Right ventricle: Systolic function was normal. - Right atrium: The atrium was mildly dilated. - Tricuspid valve: There was mild regurgitation. - Pericardium, extracardiac: A mild to moderate circumferential   pericardial effusion was identified. Features were not consistent   with tamponade physiology.   Impressions:   - No significant change since the prior study on 07/31/2017.     ASSESSMENT AND PLAN:  1. CAD. S/p DES of ostial RCA on 08/08/17. No significant angina. Continue ASA.   Continue statin. Encouraged continued exercise program. Residual disease in a diagonal branch will be managed medically.  2. CHB s/p DDD pacemaker insertion on 08/11/17. Normal Pacer follow up 3. Mild-moderate  Mitral stenosis on Echo. Right heart cath without significant stenosis/vavle gradient. No new symptoms. Will update Echo today. Last study in 2019.  4. Diastolic dysfunction 5. RA. Follow up with  Dr. Dierdre Forth. On Orencia.  6. 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 Update Echo 7. HLD on statin. Will update lipid panel.   Current medicines are reviewed at length with the patient today.  The patient does not have concerns regarding medicines.  The following changes have been made:  no change  Labs/ tests ordered today include:   No orders of the defined types were placed in this encounter.     Disposition:   FU with me 6 months  Signed, Kendre Jacinto [place], MD  08/24/2021 8:19 AM    [facility] [address], [place], [place], [address] Phone [phone], Fax [phone]

## 2021-08-16 ENCOUNTER — Ambulatory Visit (INDEPENDENT_AMBULATORY_CARE_PROVIDER_SITE_OTHER): Payer: Medicare Other | Admitting: Orthopaedic Surgery

## 2021-08-16 DIAGNOSIS — M25562 Pain in left knee: Secondary | ICD-10-CM | POA: Diagnosis not present

## 2021-08-16 NOTE — Progress Notes (Signed)
The patient is a 81 year old female that I am seeing for the first time.  She fell 6 days ago directly on her left knee.  This was Friday night.  She went to urgent care on Saturday.  X-rays were obtained that were negative for fracture.  I was able to review those today.  She says she is very bruised but she is getting around well. ? ?On examination her left knee does show bruising around the patella and the medial joint line.  There is also some bruising in her calf.  Her extensor mechanism is intact.  Her left knee is ligamentously stable and there is no significant effusion. ? ?As I did review the x-rays of her left knee.  There may be a slight faint fracture line in the patella but this is in a nonweightbearing area and her extensor mechanism is intact.  Most likely this is a minimal fracture and likely a bone contusion as well. ? ?I gave her reassurance that the only treatment for this is time and listening to her body in terms of getting back to activities as she feels better.  She wants to get back into her gardening.  She may need some type of pad on her knee for that for the short-term.  All question concerns were answered addressed.  Follow-up can be as needed. ?

## 2021-08-20 DIAGNOSIS — Z20822 Contact with and (suspected) exposure to covid-19: Secondary | ICD-10-CM | POA: Diagnosis not present

## 2021-08-21 DIAGNOSIS — M25562 Pain in left knee: Secondary | ICD-10-CM | POA: Diagnosis not present

## 2021-08-21 DIAGNOSIS — H40053 Ocular hypertension, bilateral: Secondary | ICD-10-CM | POA: Diagnosis not present

## 2021-08-24 ENCOUNTER — Ambulatory Visit (INDEPENDENT_AMBULATORY_CARE_PROVIDER_SITE_OTHER): Payer: Medicare Other | Admitting: Cardiology

## 2021-08-24 ENCOUNTER — Encounter: Payer: Self-pay | Admitting: Cardiology

## 2021-08-24 VITALS — BP 120/70 | HR 60 | Ht 62.0 in | Wt 113.4 lb

## 2021-08-24 DIAGNOSIS — Z950 Presence of cardiac pacemaker: Secondary | ICD-10-CM | POA: Diagnosis not present

## 2021-08-24 DIAGNOSIS — Z9861 Coronary angioplasty status: Secondary | ICD-10-CM | POA: Diagnosis not present

## 2021-08-24 DIAGNOSIS — I2510 Atherosclerotic heart disease of native coronary artery without angina pectoris: Secondary | ICD-10-CM

## 2021-08-24 DIAGNOSIS — I442 Atrioventricular block, complete: Secondary | ICD-10-CM

## 2021-08-24 DIAGNOSIS — I3139 Other pericardial effusion (noninflammatory): Secondary | ICD-10-CM

## 2021-08-24 DIAGNOSIS — I050 Rheumatic mitral stenosis: Secondary | ICD-10-CM | POA: Diagnosis not present

## 2021-08-24 LAB — BASIC METABOLIC PANEL
BUN/Creatinine Ratio: 22 (ref 12–28)
BUN: 18 mg/dL (ref 8–27)
CO2: 25 mmol/L (ref 20–29)
Calcium: 9.1 mg/dL (ref 8.7–10.3)
Chloride: 105 mmol/L (ref 96–106)
Creatinine, Ser: 0.81 mg/dL (ref 0.57–1.00)
Glucose: 91 mg/dL (ref 70–99)
Potassium: 4.4 mmol/L (ref 3.5–5.2)
Sodium: 140 mmol/L (ref 134–144)
eGFR: 73 mL/min/{1.73_m2} (ref >59)

## 2021-08-24 LAB — LIPID PANEL
Chol/HDL Ratio: 2 ratio (ref 0.0–4.4)
Cholesterol, Total: 147 mg/dL (ref 100–199)
HDL: 74 mg/dL (ref >39)
LDL Chol Calc (NIH): 64 mg/dL (ref 0–99)
Triglycerides: 34 mg/dL (ref 0–149)
VLDL Cholesterol Cal: 9 mg/dL (ref 5–40)

## 2021-08-24 LAB — HEPATIC FUNCTION PANEL
ALT: 17 IU/L (ref 0–32)
AST: 29 IU/L (ref 0–40)
Albumin: 3.8 g/dL (ref 3.7–4.7)
Alkaline Phosphatase: 80 IU/L (ref 44–121)
Bilirubin Total: 0.9 mg/dL (ref 0.0–1.2)
Bilirubin, Direct: 0.27 mg/dL (ref 0.00–0.40)
Total Protein: 6.3 g/dL (ref 6.0–8.5)

## 2021-08-24 NOTE — Patient Instructions (Signed)
Medication Instructions:  ?Continue same medications ?*If you need a refill on your cardiac medications before your next appointment, please call your pharmacy* ? ? ?Lab Work: ?Bmet,lipid and hepatic panels today  ? ? ?Testing/Procedures: ?Echo ? ? ?Follow-Up: ?At [facility], you and your health needs are our priority.  As part of our continuing mission to provide you with exceptional heart care, we have created designated Provider Care Teams.  These Care Teams include your primary Cardiologist (physician) and Advanced Practice Providers (APPs -  Physician Assistants and Nurse Practitioners) who all work together to provide you with the care you need, when you need it. ? ?We recommend signing up for the patient portal called "MyChart".  Sign up information is provided on this After Visit Summary.  MyChart is used to connect with patients for Virtual Visits (Telemedicine).  Patients are able to view lab/test results, encounter notes, upcoming appointments, etc.  Non-urgent messages can be sent to your provider as well.   ?To learn more about what you can do with MyChart, go to [email or web address].   ? ?Your next appointment:  6 months ?  ? ?The format for your next appointment: Office ? ? ?Provider: Dr.Jordan ? ? ?Important Information About Sugar ? ? ? ? ? ? ?

## 2021-08-28 DIAGNOSIS — M25662 Stiffness of left knee, not elsewhere classified: Secondary | ICD-10-CM | POA: Diagnosis not present

## 2021-08-28 DIAGNOSIS — R531 Weakness: Secondary | ICD-10-CM | POA: Diagnosis not present

## 2021-08-28 DIAGNOSIS — R262 Difficulty in walking, not elsewhere classified: Secondary | ICD-10-CM | POA: Diagnosis not present

## 2021-09-04 DIAGNOSIS — R531 Weakness: Secondary | ICD-10-CM | POA: Diagnosis not present

## 2021-09-04 DIAGNOSIS — R262 Difficulty in walking, not elsewhere classified: Secondary | ICD-10-CM | POA: Diagnosis not present

## 2021-09-04 DIAGNOSIS — M25662 Stiffness of left knee, not elsewhere classified: Secondary | ICD-10-CM | POA: Diagnosis not present

## 2021-09-05 DIAGNOSIS — M0579 Rheumatoid arthritis with rheumatoid factor of multiple sites without organ or systems involvement: Secondary | ICD-10-CM | POA: Diagnosis not present

## 2021-09-05 DIAGNOSIS — Z79899 Other long term (current) drug therapy: Secondary | ICD-10-CM | POA: Diagnosis not present

## 2021-09-11 ENCOUNTER — Other Ambulatory Visit (HOSPITAL_COMMUNITY): Payer: Medicare Other

## 2021-09-11 DIAGNOSIS — S82045A Nondisplaced comminuted fracture of left patella, initial encounter for closed fracture: Secondary | ICD-10-CM | POA: Diagnosis not present

## 2021-09-11 DIAGNOSIS — Z9889 Other specified postprocedural states: Secondary | ICD-10-CM | POA: Diagnosis not present

## 2021-09-12 ENCOUNTER — Ambulatory Visit (HOSPITAL_COMMUNITY): Payer: Medicare Other | Attending: Cardiology

## 2021-09-12 DIAGNOSIS — I442 Atrioventricular block, complete: Secondary | ICD-10-CM | POA: Insufficient documentation

## 2021-09-12 DIAGNOSIS — I2510 Atherosclerotic heart disease of native coronary artery without angina pectoris: Secondary | ICD-10-CM | POA: Diagnosis not present

## 2021-09-12 DIAGNOSIS — I050 Rheumatic mitral stenosis: Secondary | ICD-10-CM | POA: Diagnosis not present

## 2021-09-12 DIAGNOSIS — Z9861 Coronary angioplasty status: Secondary | ICD-10-CM | POA: Insufficient documentation

## 2021-09-12 DIAGNOSIS — Z950 Presence of cardiac pacemaker: Secondary | ICD-10-CM | POA: Diagnosis not present

## 2021-09-12 DIAGNOSIS — I3139 Other pericardial effusion (noninflammatory): Secondary | ICD-10-CM | POA: Diagnosis not present

## 2021-09-13 DIAGNOSIS — R531 Weakness: Secondary | ICD-10-CM | POA: Diagnosis not present

## 2021-09-13 DIAGNOSIS — M25662 Stiffness of left knee, not elsewhere classified: Secondary | ICD-10-CM | POA: Diagnosis not present

## 2021-09-13 DIAGNOSIS — R262 Difficulty in walking, not elsewhere classified: Secondary | ICD-10-CM | POA: Diagnosis not present

## 2021-09-13 LAB — ECHOCARDIOGRAM COMPLETE
Area-P 1/2: 2.48 cm2
MV VTI: 1.35 cm2
P 1/2 time: 475 msec
S' Lateral: 2.3 cm

## 2021-09-18 DIAGNOSIS — M25662 Stiffness of left knee, not elsewhere classified: Secondary | ICD-10-CM | POA: Diagnosis not present

## 2021-09-18 DIAGNOSIS — R531 Weakness: Secondary | ICD-10-CM | POA: Diagnosis not present

## 2021-09-18 DIAGNOSIS — R262 Difficulty in walking, not elsewhere classified: Secondary | ICD-10-CM | POA: Diagnosis not present

## 2021-09-24 DIAGNOSIS — R262 Difficulty in walking, not elsewhere classified: Secondary | ICD-10-CM | POA: Diagnosis not present

## 2021-09-24 DIAGNOSIS — M25662 Stiffness of left knee, not elsewhere classified: Secondary | ICD-10-CM | POA: Diagnosis not present

## 2021-09-24 DIAGNOSIS — R531 Weakness: Secondary | ICD-10-CM | POA: Diagnosis not present

## 2021-09-26 DIAGNOSIS — H40053 Ocular hypertension, bilateral: Secondary | ICD-10-CM | POA: Diagnosis not present

## 2021-10-11 DIAGNOSIS — M0579 Rheumatoid arthritis with rheumatoid factor of multiple sites without organ or systems involvement: Secondary | ICD-10-CM | POA: Diagnosis not present

## 2021-10-17 ENCOUNTER — Ambulatory Visit (INDEPENDENT_AMBULATORY_CARE_PROVIDER_SITE_OTHER): Payer: Medicare Other

## 2021-10-17 DIAGNOSIS — I442 Atrioventricular block, complete: Secondary | ICD-10-CM | POA: Diagnosis not present

## 2021-10-19 LAB — CUP PACEART REMOTE DEVICE CHECK
Battery Remaining Longevity: 78 mo
Battery Voltage: 2.97 V
Brady Statistic AP VP Percent: 41.4 %
Brady Statistic AP VS Percent: 0.06 %
Brady Statistic AS VP Percent: 58.38 %
Brady Statistic AS VS Percent: 0.15 %
Brady Statistic RA Percent Paced: 41.39 %
Brady Statistic RV Percent Paced: 99.79 %
Date Time Interrogation Session: 20230705043850
Implantable Lead Implant Date: 20190429
Implantable Lead Implant Date: 20190429
Implantable Lead Location: 753859
Implantable Lead Location: 753860
Implantable Lead Model: 5076
Implantable Lead Model: 5076
Implantable Pulse Generator Implant Date: 20190429
Lead Channel Impedance Value: 323 Ohm
Lead Channel Impedance Value: 323 Ohm
Lead Channel Impedance Value: 380 Ohm
Lead Channel Impedance Value: 418 Ohm
Lead Channel Pacing Threshold Amplitude: 0.5 V
Lead Channel Pacing Threshold Amplitude: 0.625 V
Lead Channel Pacing Threshold Pulse Width: 0.4 ms
Lead Channel Pacing Threshold Pulse Width: 0.4 ms
Lead Channel Sensing Intrinsic Amplitude: 19.625 mV
Lead Channel Sensing Intrinsic Amplitude: 19.625 mV
Lead Channel Sensing Intrinsic Amplitude: 3.125 mV
Lead Channel Sensing Intrinsic Amplitude: 3.125 mV
Lead Channel Setting Pacing Amplitude: 1.5 V
Lead Channel Setting Pacing Amplitude: 2.5 V
Lead Channel Setting Pacing Pulse Width: 0.4 ms
Lead Channel Setting Sensing Sensitivity: 1.2 mV

## 2021-10-27 ENCOUNTER — Other Ambulatory Visit: Payer: Self-pay | Admitting: Cardiology

## 2021-10-29 DIAGNOSIS — M25461 Effusion, right knee: Secondary | ICD-10-CM | POA: Diagnosis not present

## 2021-10-29 DIAGNOSIS — M810 Age-related osteoporosis without current pathological fracture: Secondary | ICD-10-CM | POA: Diagnosis not present

## 2021-10-29 DIAGNOSIS — M0579 Rheumatoid arthritis with rheumatoid factor of multiple sites without organ or systems involvement: Secondary | ICD-10-CM | POA: Diagnosis not present

## 2021-10-29 DIAGNOSIS — M5136 Other intervertebral disc degeneration, lumbar region: Secondary | ICD-10-CM | POA: Diagnosis not present

## 2021-10-29 DIAGNOSIS — Z681 Body mass index (BMI) 19 or less, adult: Secondary | ICD-10-CM | POA: Diagnosis not present

## 2021-10-29 DIAGNOSIS — M150 Primary generalized (osteo)arthritis: Secondary | ICD-10-CM | POA: Diagnosis not present

## 2021-10-30 ENCOUNTER — Other Ambulatory Visit: Payer: Self-pay | Admitting: Internal Medicine

## 2021-10-30 DIAGNOSIS — M810 Age-related osteoporosis without current pathological fracture: Secondary | ICD-10-CM

## 2021-11-07 NOTE — Progress Notes (Signed)
Remote pacemaker transmission.   

## 2021-11-12 DIAGNOSIS — M0579 Rheumatoid arthritis with rheumatoid factor of multiple sites without organ or systems involvement: Secondary | ICD-10-CM | POA: Diagnosis not present

## 2021-11-20 ENCOUNTER — Encounter: Payer: Self-pay | Admitting: Cardiology

## 2021-11-20 ENCOUNTER — Ambulatory Visit (INDEPENDENT_AMBULATORY_CARE_PROVIDER_SITE_OTHER): Payer: Medicare Other | Admitting: Cardiology

## 2021-11-20 VITALS — BP 100/64 | HR 60 | Ht 62.0 in | Wt 113.2 lb

## 2021-11-20 DIAGNOSIS — Z950 Presence of cardiac pacemaker: Secondary | ICD-10-CM | POA: Diagnosis not present

## 2021-11-20 DIAGNOSIS — I2510 Atherosclerotic heart disease of native coronary artery without angina pectoris: Secondary | ICD-10-CM | POA: Diagnosis not present

## 2021-11-20 DIAGNOSIS — I441 Atrioventricular block, second degree: Secondary | ICD-10-CM

## 2021-11-20 DIAGNOSIS — Z9861 Coronary angioplasty status: Secondary | ICD-10-CM | POA: Diagnosis not present

## 2021-11-20 NOTE — Progress Notes (Signed)
Electrophysiology Office Note   Date:  11/20/2021   ID:  Lise, Pincus 1940/10/16, MRN [id number]  PCP:  [name], MD  Cardiologist:  [place] Primary Electrophysiologist:  [name], MD    No chief complaint on file.    History of Present Illness: Elizabeth Estrada is a 81 y.o. female who is being seen today for the evaluation of complete heart block at the request of [name], MD. Presenting today for electrophysiology evaluation.    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  She had a rising threshold and is status post lead revision 10/13/2017.  Today, denies symptoms of palpitations, chest pain, shortness of breath, orthopnea, PND, lower extremity edema, claudication, dizziness, presyncope, syncope, bleeding, or neurologic sequela. The patient is tolerating medications without difficulties.  She is currently feeling well.  She has no cardiac complaints at this time.  She did fall this past spring and fractured her knee.  This was a long recovery.  She had a fall after chasing her great granddaughter.  Aside from that, she has no major complaints.   Past Medical History:  Diagnosis Date   AV block    a. s/p MDT dual chamber PPM (HIS bundle lead)   Chronic depression    Coronary artery disease    Family history of adverse reaction to anesthesia    "dad had PONV"   Fibromyalgia    GERD (gastroesophageal reflux disease)    Heart murmur    History of kidney stones    Hypertension    "before stent and pacemaker" (10/13/2017)   Melanoma ([place])    removed from back   MVP (mitral valve prolapse)    Osteoarthritis    Osteoporosis    Presence of permanent cardiac pacemaker 08/08/2017   RA (rheumatoid arthritis) ([place])    "all over; mainly hands and knees" (10/13/2017)   Rib fracture    Past Surgical History:  Procedure Laterality Date    BREAST BIOPSY Left 11/2003   benign - Dr. Margot Chimes   CARDIAC CATHETERIZATION     CARDIOVASCULAR STRESS TEST  10/19/1999, 2013   EF 97%, NO EVIDENCE OF ISCHEMIA, 2013 NORMAL   CATARACT EXTRACTION W/ INTRAOCULAR LENS  IMPLANT, BILATERAL Bilateral    COLONOSCOPY     CORONARY STENT INTERVENTION N/A 08/08/2017   Procedure: CORONARY STENT INTERVENTION;  Surgeon: [name], MD;  Location: [facility];  Service: Cardiovascular;  Laterality: N/A;   DILATATION & CURRETTAGE/HYSTEROSCOPY WITH RESECTOCOPE N/A 07/06/2013   Procedure: DILATATION & CURETTAGE/HYSTEROSCOPY WITH RESECTOCOPE with removal of endometrial lesion;  Surgeon: [name], MD;  Location: [facility];  Service: Gynecology;  Laterality: N/A;   LEAD REVISION/REPAIR N/A 10/13/2017   Procedure: LEAD REVISION/REPAIR;  Surgeon: [name], MD;  Location: [facility];  Service: Cardiovascular;  Laterality: N/A;   MELANOMA EXCISION  1990s?   from back   PACEMAKER IMPLANT N/A 08/11/2017   Procedure: PACEMAKER IMPLANT;  Surgeon: [name], MD;  Location: [facility];  Service: Cardiovascular;  Laterality: N/A;   RIGHT/LEFT HEART CATH AND CORONARY ANGIOGRAPHY N/A 08/08/2017   Procedure: RIGHT/LEFT HEART CATH AND CORONARY ANGIOGRAPHY;  Surgeon: [name], MD;  Location: [facility];  Service: Cardiovascular;  Laterality: N/A;     Current Outpatient Medications  Medication Sig Dispense Refill   abatacept (ORENCIA) 250 MG injection Inject 250 mg into  the vein every 30 (thirty) days.      aspirin 81 MG tablet Take 81 mg by mouth daily.     atorvastatin (LIPITOR) 40 MG tablet TAKE 1 TABLET BY MOUTH EVERY DAY 90 tablet 2   cetirizine (ZYRTEC) 10 MG tablet Take 10 mg by mouth daily as needed for allergies.     escitalopram (LEXAPRO) 20 MG tablet Take 20 mg by mouth at bedtime.      fluticasone (FLONASE) 50 MCG/ACT nasal spray Place 1-2 sprays into both nostrils daily as needed for allergies or  rhinitis.     latanoprost (XALATAN) 0.005 % ophthalmic solution SMARTSIG:1 Drop(s) In Eye(s)     Multiple Vitamins-Minerals (PRESERVISION AREDS 2) CAPS as directed.     No current facility-administered medications for this visit.    Allergies:   Methotrexate derivatives, Plaquenil [hydroxychloroquine sulfate], Remicade [infliximab], Ridaura [auranofin], and Sudafed [pseudoephedrine]   Social History:  The patient  reports that she has never smoked. She has never used smokeless tobacco. She reports that she does not drink alcohol and does not use drugs.   Family History:  The patient's family history includes CAD (age of onset: 68) in her brother; Coronary artery disease in her mother; Heart disease in her father; Hypertension in her father; Pancreatic cancer in her father.   ROS:  Please see the history of present illness.   Otherwise, review of systems is positive for none.   All other systems are reviewed and negative.   PHYSICAL EXAM: VS:  BP 100/64   Pulse 60   Ht '5\' 2"'$  (1.575 m)   Wt 113 lb 3.2 oz (51.3 kg)   LMP 04/15/1990   SpO2 97%   BMI 20.70 kg/m  , BMI Body mass index is 20.7 kg/m. GEN: Well nourished, well developed, in no acute distress  HEENT: normal  Neck: no JVD, carotid bruits, or masses Cardiac: RRR; no murmurs, rubs, or gallops,no edema  Respiratory:  clear to auscultation bilaterally, normal work of breathing GI: soft, nontender, nondistended, + BS MS: no deformity or atrophy  Skin: warm and dry, device site well healed Neuro:  Strength and sensation are intact Psych: euthymic mood, full affect  EKG:  EKG is not ordered today. Personal review of the ekg ordered 08/24/21 shows AV paced  Personal review of the device interrogation today. Results in [facility]: 08/24/2021: ALT 17; BUN 18; Creatinine, Ser 0.81; Potassium 4.4; Sodium 140    Lipid Panel     Component Value Date/Time   CHOL 147 08/24/2021 0843   TRIG 34 08/24/2021 0843   HDL 74  08/24/2021 0843   CHOLHDL 2.0 08/24/2021 0843   LDLCALC 64 08/24/2021 0843     Wt Readings from Last 3 Encounters:  11/20/21 113 lb 3.2 oz (51.3 kg)  08/24/21 113 lb 6.4 oz (51.4 kg)  11/22/20 108 lb 9.6 oz (49.3 kg)      Other studies Reviewed: Additional studies/ records that were reviewed today include: TTE 09/13/2021 Review of the above records today demonstrates:   1. Left ventricular ejection fraction, by estimation, is 60 to 65%. The  left ventricle has normal function. The left ventricle has no regional  wall motion abnormalities. Left ventricular diastolic parameters are  consistent with Grade II diastolic  dysfunction (pseudonormalization). Elevated left atrial pressure.   2. Right ventricular systolic function is normal. The right ventricular  size is normal. There is mildly elevated pulmonary artery systolic  pressure. The estimated right ventricular systolic  pressure is 38.5 mmHg.   3. Moderate pericardial effusion. There is no evidence of cardiac  tamponade.   4. The mitral valve is abnormal. Moderate leaflet thickening, No evidence  of mitral valve regurgitation. Moderate mitral stenosis. The mean mitral  valve gradient is 7.0 mmHg with average heart rate of 60 bpm. MVA 1.4 cm^2  by continuity equation.   5. Tricuspid valve regurgitation is moderate.   6. The aortic valve is tricuspid. Aortic valve regurgitation is mild.  Aortic valve sclerosis is present, with no evidence of aortic valve  stenosis.   7. The inferior vena cava is normal in size with greater than 50%  respiratory variability, suggesting right atrial pressure of 3 mmHg.    LHC/RHC 08/08/17 80% ostial RCA.  The RCA is dominant. 30 to 40% proximal and mid LAD.  70% proximal diagonal #1. Widely patent circumflex with tortuous obtuse marginals. Normal left main Calcified mitral valve and annulus.  No significant mitral stenosis is noted. Mild pulmonary hypertension Normal left ventricular systolic  function with elevated end-diastolic and pulmonary wedge pressure in the 20 to 22 mmHg range consistent with diastolic heart failure, chronic. Successful PCI and stent of the ostial RCA from 80% with TIMI grade III flow to less than 10% with TIMI grade III flow using a 3.0 x 12 mm Synergy postdilated to 3.5 mmHg pressure.   ASSESSMENT AND PLAN:  1.  Complete heart block: Status post Medtronic single-chamber pacemaker implanted 08/11/2017.  Lead revision due to elevated thresholds 10/13/2017.  Device function appropriately.  No changes.  2.  Coronary artery disease: Status post RCA stent in 2019.  Continue aspirin and Plavix.  Plan per primary cardiology.  3.  Atrial tachycardia: Minimal noted on pacemaker interrogation.   Current medicines are reviewed at length with the patient today.   The patient does not have concerns regarding her medicines.  The following changes were made today: none  Labs/ tests ordered today include:  No orders of the defined types were placed in this encounter.    Disposition:   FU 12 months  Signed, Arieliz Latino Meredith Leeds, MD  11/20/2021 2:31 PM     [facility] [address] [place] [address] [phone] (office) [phone] (fax)

## 2021-11-20 NOTE — Patient Instructions (Addendum)
Medication Instructions:  Your physician recommends that you continue on your current medications as directed. Please refer to the Current Medication list given to you today.  Labwork: None ordered.  Testing/Procedures: None ordered.  Follow-Up: Your physician wants you to follow-up in: one year with one of the following Advanced Practice Providers on your designated Care Team:   Tommye Standard, [place] Legrand Como "Jonni Sanger" Chalmers Cater, [place]  Remote monitoring is used to monitor your Pacemaker from home. This monitoring reduces the number of office visits required to check your device to one time per year. It allows [place] to keep an eye on the functioning of your device to ensure it is working properly. You are scheduled for a device check from home on 01/15/2022. You may send your transmission at any time that day. If you have a wireless device, the transmission will be sent automatically. After your physician reviews your transmission, you will receive a postcard with your next transmission date.  Any Other Special Instructions Will Be Listed Below (If Applicable).  If you need a refill on your cardiac medications before your next appointment, please call your pharmacy.   Important Information About [place]

## 2021-12-11 DIAGNOSIS — M0579 Rheumatoid arthritis with rheumatoid factor of multiple sites without organ or systems involvement: Secondary | ICD-10-CM | POA: Diagnosis not present

## 2022-01-07 DIAGNOSIS — Z1231 Encounter for screening mammogram for malignant neoplasm of breast: Secondary | ICD-10-CM | POA: Diagnosis not present

## 2022-01-08 DIAGNOSIS — R635 Abnormal weight gain: Secondary | ICD-10-CM | POA: Diagnosis not present

## 2022-01-08 DIAGNOSIS — Z23 Encounter for immunization: Secondary | ICD-10-CM | POA: Diagnosis not present

## 2022-01-08 DIAGNOSIS — L853 Xerosis cutis: Secondary | ICD-10-CM | POA: Diagnosis not present

## 2022-01-08 DIAGNOSIS — K5900 Constipation, unspecified: Secondary | ICD-10-CM | POA: Diagnosis not present

## 2022-01-10 DIAGNOSIS — M0579 Rheumatoid arthritis with rheumatoid factor of multiple sites without organ or systems involvement: Secondary | ICD-10-CM | POA: Diagnosis not present

## 2022-01-15 ENCOUNTER — Ambulatory Visit (INDEPENDENT_AMBULATORY_CARE_PROVIDER_SITE_OTHER): Payer: Medicare Other

## 2022-01-15 DIAGNOSIS — I442 Atrioventricular block, complete: Secondary | ICD-10-CM | POA: Diagnosis not present

## 2022-01-15 LAB — CUP PACEART REMOTE DEVICE CHECK
Battery Remaining Longevity: 75 mo
Battery Voltage: 2.97 V
Brady Statistic AP VP Percent: 36.83 %
Brady Statistic AP VS Percent: 0.27 %
Brady Statistic AS VP Percent: 61.87 %
Brady Statistic AS VS Percent: 1.02 %
Brady Statistic RA Percent Paced: 37.39 %
Brady Statistic RV Percent Paced: 98.7 %
Date Time Interrogation Session: 20231002235643
Implantable Lead Implant Date: 20190429
Implantable Lead Implant Date: 20190429
Implantable Lead Location: 753859
Implantable Lead Location: 753860
Implantable Lead Model: 5076
Implantable Lead Model: 5076
Implantable Pulse Generator Implant Date: 20190429
Lead Channel Impedance Value: 342 Ohm
Lead Channel Impedance Value: 361 Ohm
Lead Channel Impedance Value: 399 Ohm
Lead Channel Impedance Value: 437 Ohm
Lead Channel Pacing Threshold Amplitude: 0.5 V
Lead Channel Pacing Threshold Amplitude: 0.625 V
Lead Channel Pacing Threshold Pulse Width: 0.4 ms
Lead Channel Pacing Threshold Pulse Width: 0.4 ms
Lead Channel Sensing Intrinsic Amplitude: 2.25 mV
Lead Channel Sensing Intrinsic Amplitude: 2.25 mV
Lead Channel Sensing Intrinsic Amplitude: 20.125 mV
Lead Channel Sensing Intrinsic Amplitude: 20.125 mV
Lead Channel Setting Pacing Amplitude: 1.5 V
Lead Channel Setting Pacing Amplitude: 2.5 V
Lead Channel Setting Pacing Pulse Width: 0.4 ms
Lead Channel Setting Sensing Sensitivity: 1.2 mV

## 2022-01-21 DIAGNOSIS — L821 Other seborrheic keratosis: Secondary | ICD-10-CM | POA: Diagnosis not present

## 2022-01-21 DIAGNOSIS — L570 Actinic keratosis: Secondary | ICD-10-CM | POA: Diagnosis not present

## 2022-01-21 DIAGNOSIS — Z85820 Personal history of malignant melanoma of skin: Secondary | ICD-10-CM | POA: Diagnosis not present

## 2022-01-21 DIAGNOSIS — D225 Melanocytic nevi of trunk: Secondary | ICD-10-CM | POA: Diagnosis not present

## 2022-01-21 DIAGNOSIS — L720 Epidermal cyst: Secondary | ICD-10-CM | POA: Diagnosis not present

## 2022-01-21 DIAGNOSIS — D1801 Hemangioma of skin and subcutaneous tissue: Secondary | ICD-10-CM | POA: Diagnosis not present

## 2022-01-23 DIAGNOSIS — Z23 Encounter for immunization: Secondary | ICD-10-CM | POA: Diagnosis not present

## 2022-01-25 NOTE — Progress Notes (Signed)
Remote pacemaker transmission.   

## 2022-02-04 DIAGNOSIS — Z1231 Encounter for screening mammogram for malignant neoplasm of breast: Secondary | ICD-10-CM | POA: Diagnosis not present

## 2022-02-04 DIAGNOSIS — K219 Gastro-esophageal reflux disease without esophagitis: Secondary | ICD-10-CM | POA: Diagnosis not present

## 2022-02-04 DIAGNOSIS — F3340 Major depressive disorder, recurrent, in remission, unspecified: Secondary | ICD-10-CM | POA: Diagnosis not present

## 2022-02-04 DIAGNOSIS — I209 Angina pectoris, unspecified: Secondary | ICD-10-CM | POA: Diagnosis not present

## 2022-02-04 DIAGNOSIS — I25119 Atherosclerotic heart disease of native coronary artery with unspecified angina pectoris: Secondary | ICD-10-CM | POA: Diagnosis not present

## 2022-02-04 DIAGNOSIS — M1990 Unspecified osteoarthritis, unspecified site: Secondary | ICD-10-CM | POA: Diagnosis not present

## 2022-02-04 DIAGNOSIS — Z950 Presence of cardiac pacemaker: Secondary | ICD-10-CM | POA: Diagnosis not present

## 2022-02-04 DIAGNOSIS — E7800 Pure hypercholesterolemia, unspecified: Secondary | ICD-10-CM | POA: Diagnosis not present

## 2022-02-04 DIAGNOSIS — I442 Atrioventricular block, complete: Secondary | ICD-10-CM | POA: Diagnosis not present

## 2022-02-04 DIAGNOSIS — Z0000 Encounter for general adult medical examination without abnormal findings: Secondary | ICD-10-CM | POA: Diagnosis not present

## 2022-02-04 DIAGNOSIS — M069 Rheumatoid arthritis, unspecified: Secondary | ICD-10-CM | POA: Diagnosis not present

## 2022-02-07 DIAGNOSIS — Z79899 Other long term (current) drug therapy: Secondary | ICD-10-CM | POA: Diagnosis not present

## 2022-02-07 DIAGNOSIS — Z111 Encounter for screening for respiratory tuberculosis: Secondary | ICD-10-CM | POA: Diagnosis not present

## 2022-02-07 DIAGNOSIS — R5383 Other fatigue: Secondary | ICD-10-CM | POA: Diagnosis not present

## 2022-02-07 DIAGNOSIS — M0579 Rheumatoid arthritis with rheumatoid factor of multiple sites without organ or systems involvement: Secondary | ICD-10-CM | POA: Diagnosis not present

## 2022-02-19 DIAGNOSIS — H04123 Dry eye syndrome of bilateral lacrimal glands: Secondary | ICD-10-CM | POA: Diagnosis not present

## 2022-02-19 DIAGNOSIS — Z961 Presence of intraocular lens: Secondary | ICD-10-CM | POA: Diagnosis not present

## 2022-02-19 DIAGNOSIS — H40053 Ocular hypertension, bilateral: Secondary | ICD-10-CM | POA: Diagnosis not present

## 2022-02-19 DIAGNOSIS — H26493 Other secondary cataract, bilateral: Secondary | ICD-10-CM | POA: Diagnosis not present

## 2022-02-19 DIAGNOSIS — D492 Neoplasm of unspecified behavior of bone, soft tissue, and skin: Secondary | ICD-10-CM | POA: Diagnosis not present

## 2022-03-08 DIAGNOSIS — Z760 Encounter for issue of repeat prescription: Secondary | ICD-10-CM | POA: Diagnosis not present

## 2022-03-11 ENCOUNTER — Ambulatory Visit: Payer: Medicare Other | Admitting: Cardiology

## 2022-03-14 NOTE — Progress Notes (Deleted)
Cardiology Office Note   Date:  03/14/2022   ID:  Elizabeth Estrada, Elizabeth Estrada [birth year], MRN [id number]  PCP:  [name], MD  Cardiologist:   Emri Sample [place], MD   No chief complaint on file.      History of Present Illness: AADVIKA Estrada is a 81 y.o. female who is seen for follow up CAD and complete heart block.   She has a history of MVP/MS and family history of CAD. She has a history of murmur. Echo in 2006 mentions mild prolapse but in 2013 she had an Echo showing a small pericardial effusion otherwise normal. No prolapse seen. She also had a normal stress Echo at that time.   She was seen in January 2019 with some palpitations. Seen again in April with symptoms of exertional chest pressure. Echo showed mild to moderate mitral stenosis. ETT was done and demonstrated very poor exercise tolerance with hypertensive BP response. There was 2:1 AV block noted. An event monitor showed episodes of complete heart block. She underwent right and left heart cath. This showed an 80% ostial RCA stenosis and 70% diagonal. There was mild pulmonary HTN without significant mitral stenosis. EDP was elevated c/w diastolic dysfunction. She underwent stenting of the ostial RCA with DES. She was placed on DAPT. Post stent she had persistent episodes of CHB despite correcting ischemia and underwent DDD pacemaker placement on 08/11/17.    She was seen by [name] in August 2019. Had persistent SOB. Repeat Echo showed no change. 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 Also has moderate mitral stenosis.   Pacemaker check on 07/17/21 was normal.   On follow up today she is feeling well from a cardiac standpoint. She was riding a stationary bike some but fell a couple of weeks ago and fractured her left patella. Also had eye surgery for glaucoma. Denies SOB or palpitations. No edema. Weight is stable.    Past Medical History:  Diagnosis Date   AV block    a. s/p MDT dual chamber  PPM (HIS bundle lead)   Chronic depression    Coronary artery disease    Family history of adverse reaction to anesthesia    "dad had PONV"   Fibromyalgia    GERD (gastroesophageal reflux disease)    Heart murmur    History of kidney stones    Hypertension    "before stent and pacemaker" (10/13/2017)   Melanoma ([place])    removed from back   MVP (mitral valve prolapse)    Osteoarthritis    Osteoporosis    Presence of permanent cardiac pacemaker 08/08/2017   RA (rheumatoid arthritis) ([place])    "all over; mainly hands and knees" (10/13/2017)   Rib fracture     Past Surgical History:  Procedure Laterality Date   BREAST BIOPSY Left 11/2003   benign - Dr. Margot Chimes   CARDIAC CATHETERIZATION     CARDIOVASCULAR STRESS TEST  10/19/1999, 2013   EF 97%, NO EVIDENCE OF ISCHEMIA, 2013 NORMAL   CATARACT EXTRACTION W/ INTRAOCULAR LENS  IMPLANT, BILATERAL Bilateral    COLONOSCOPY     CORONARY STENT INTERVENTION N/A 08/08/2017   Procedure: CORONARY STENT INTERVENTION;  Surgeon: [name], MD;  Location: [facility];  Service: Cardiovascular;  Laterality: N/A;   DILATATION & CURRETTAGE/HYSTEROSCOPY WITH RESECTOCOPE N/A 07/06/2013   Procedure: DILATATION & CURETTAGE/HYSTEROSCOPY WITH RESECTOCOPE with removal of endometrial lesion;  Surgeon: [name], MD;  Location: [facility];  Service: Gynecology;  Laterality: N/A;   LEAD REVISION/REPAIR N/A 10/13/2017   Procedure: LEAD REVISION/REPAIR;  Surgeon: [name], MD;  Location: [facility];  Service: Cardiovascular;  Laterality: N/A;   MELANOMA EXCISION  1990s?   from back   PACEMAKER IMPLANT N/A 08/11/2017   Procedure: PACEMAKER IMPLANT;  Surgeon: [name], MD;  Location: [facility];  Service: Cardiovascular;  Laterality: N/A;   RIGHT/LEFT HEART CATH AND CORONARY ANGIOGRAPHY N/A 08/08/2017   Procedure: RIGHT/LEFT HEART CATH AND CORONARY ANGIOGRAPHY;  Surgeon: [name], MD;  Location: [facility];  Service: Cardiovascular;  Laterality: N/A;     Current Outpatient Medications  Medication Sig Dispense Refill   abatacept (ORENCIA) 250 MG injection Inject 250 mg into the vein every 30 (thirty) days.      aspirin 81 MG tablet Take 81 mg by mouth daily.     atorvastatin (LIPITOR) 40 MG tablet TAKE 1 TABLET BY MOUTH EVERY DAY 90 tablet 2   cetirizine (ZYRTEC) 10 MG tablet Take 10 mg by mouth daily as needed for allergies.     escitalopram (LEXAPRO) 20 MG tablet Take 20 mg by mouth at bedtime.      fluticasone (FLONASE) 50 MCG/ACT nasal spray Place 1-2 sprays into both nostrils daily as needed for allergies or rhinitis.     latanoprost (XALATAN) 0.005 % ophthalmic solution SMARTSIG:1 Drop(s) In Eye(s)     Multiple Vitamins-Minerals (PRESERVISION AREDS 2) CAPS as directed.     No current facility-administered medications for this visit.    Allergies:   Methotrexate derivatives, Plaquenil [hydroxychloroquine sulfate], Remicade [infliximab], Ridaura [auranofin], and Sudafed [pseudoephedrine]    Social History:  The patient  reports that she has never smoked. She has never used smokeless tobacco. She reports that she does not drink alcohol and does not use drugs.   Family History:  The patient's family history includes CAD (age of onset: 72) in her brother; Coronary artery disease in her mother; Heart disease in her father; Hypertension in her father; Pancreatic cancer in her father.    ROS:  Please see the history of present illness.  . Otherwise, review of systems are positive for none.   All other systems are reviewed and negative.    PHYSICAL EXAM: VS:  LMP 04/15/1990  , BMI There is no height or weight on file to calculate BMI. GENERAL:  Well appearing WF in NAD HEENT:  PERRL, EOMI, sclera are clear. Oropharynx is clear. NECK:  No jugular venous distention, carotid upstroke brisk and symmetric, no bruits, no thyromegaly or adenopathy LUNGS:  Clear to auscultation  bilaterally CHEST:  Unremarkable HEART:  RRR,  PMI not displaced or sustained,S1 and S2 within normal limits, no S3, no S4: no clicks, no rubs, soft 1/6 systolic murmur at the apex ABD:  Soft, nontender. BS +, no masses or bruits. No hepatomegaly, no splenomegaly EXT:  2 + pulses throughout, no edema, no cyanosis no clubbing SKIN:  Warm and dry.  No rashes NEURO:  Alert and oriented x 3. Cranial nerves II through XII intact. PSYCH:  Cognitively intact  EKG:  EKG is  ordered today. AV sequential pacing rate 60. I have personally reviewed and interpreted this study.    Recent Labs: Lab Results  Component Value Date   WBC 8.8 09/23/2018   HGB 13.2 09/23/2018   HCT 41.1 09/23/2018   PLT 165 09/23/2018   GLUCOSE 91 08/24/2021   CHOL 147 08/24/2021  TRIG 34 08/24/2021   HDL 74 08/24/2021   LDLCALC 64 08/24/2021   ALT 17 08/24/2021   AST 29 08/24/2021   NA 140 08/24/2021   K 4.4 08/24/2021   CL 105 08/24/2021   CREATININE 0.81 08/24/2021   BUN 18 08/24/2021   CO2 25 08/24/2021   TSH 1.610 07/14/2017   INR 1.03 08/13/2017     Lipid Panel    Component Value Date/Time   CHOL 147 08/24/2021 0843   TRIG 34 08/24/2021 0843   HDL 74 08/24/2021 0843   CHOLHDL 2.0 08/24/2021 0843   LDLCALC 64 08/24/2021 0843      Wt Readings from Last 3 Encounters:  11/20/21 113 lb 3.2 oz (51.3 kg)  08/24/21 113 lb 6.4 oz (51.4 kg)  11/22/20 108 lb 9.6 oz (49.3 kg)      Other studies Reviewed: Additional studies/ records that were reviewed today include: Event monitor 07/24/17: Study Highlights   Normal sinus rhythm Periods of intermittent complete heart block and Type 2 second degree AV block with 2:1 conduction   Echo 07/31/17: Study Conclusions   - Left ventricle: The cavity size was normal. Wall thickness was   normal. Systolic function was normal. The estimated ejection   fraction was in the range of 60% to 65%. Wall motion was normal;   there were no regional wall motion  abnormalities. Doppler   parameters are consistent with abnormal left ventricular   relaxation (grade 1 diastolic dysfunction). - Aortic valve: There was no stenosis. - Mitral valve: The findings are consistent with moderate stenosis.   There was no significant regurgitation. Mean gradient (D): 8 mm   Hg. Valve area by pressure half-time: 1.49 cm^2. - Left atrium: The atrium was moderately dilated. - Right ventricle: The cavity size was normal. Systolic function   was normal. - Tricuspid valve: Peak RV-RA gradient (S): 26 mm Hg. - Pulmonary arteries: PA peak pressure: 29 mm Hg (S). - Inferior vena cava: The vessel was normal in size. The   respirophasic diameter changes were in the normal range (>= 50%),   consistent with normal central venous pressure. - Pericardium, extracardiac: Small to moderate pericardial   effusion. < 25% respirophasic variation of mitral E inflow   velocity. IVC is not dilated. No significant RV diastolic   collapse. No pericardial tamponade.   Impressions:   - Normal LV size with EF 60-65%. Normal RV size and systolic   function. 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 There was a small to moderate pericardial   effusion without tamponade.  ETT 08/05/17: Study Highlights     Blood pressure demonstrated a hypertensive response to exercise. There was no ST segment deviation noted during stress.   Abnormal ETT with poor exercise tolerance (1:01); pt complained of chest discomfort during the study; hypertensive BP response; pt developed 2:1 AV block with exertion (that correlated with chest pain) that resolved in recovery; no ST changes. Pt reviewed with Dr Curt Bears prior to DC.     Cardiac cath/PCI: 08/08/17: Conclusion   80% ostial RCA.  The RCA is dominant. 30 to 40% proximal and mid LAD.  70% proximal diagonal #1. Widely patent circumflex with tortuous obtuse marginals. Normal left main Calcified mitral valve and  annulus.  No significant mitral stenosis is noted. Mild pulmonary hypertension Normal left ventricular systolic function with elevated end-diastolic and pulmonary wedge pressure in the 20 to 22 mmHg range consistent with diastolic heart failure, chronic. Successful PCI and  stent of the ostial RCA from 80% with TIMI grade III flow to less than 10% with TIMI grade III flow using a 3.0 x 12 mm Synergy postdilated to 3.5 mmHg pressure.   RECOMMENDATIONS:   Aspirin and Plavix for at least 6 months. Pacemaker may be needed if continued prolonged episodes of second-degree heart block with bradycardia. Aggressive risk factor modification. Probable discharge in a.m. if no complications.      Pacemaker implant 08/11/17: CONCLUSIONS:   1. Successful implantation of a Medtronic Azure XT DR MRI SureScan dual-chamber pacemaker for symptomatic bradycardia  2. No early apparent complications.     Echo 12/05/17: Study Conclusions   - Left ventricle: The cavity size was normal. Systolic function was   vigorous. The estimated ejection fraction was in the range of 65%   to 70%. Wall motion was normal; there were no regional wall   motion abnormalities. Doppler parameters are consistent with   abnormal left ventricular relaxation (grade 1 diastolic   dysfunction). Doppler parameters are consistent with elevated   ventricular end-diastolic filling pressure. - Mitral valve: Moderately thickened, moderately calcified leaflets   . The findings are consistent with moderate stenosis. There was   mild regurgitation. Mean gradient (D): 7 mm Hg. Valve area by   continuity equation (using LVOT flow): 1.17 cm^2. - Left atrium: The atrium was mildly dilated. - Right ventricle: Systolic function was normal. - Right atrium: The atrium was mildly dilated. - Tricuspid valve: There was mild regurgitation. - Pericardium, extracardiac: A mild to moderate circumferential   pericardial effusion was identified. Features  were not consistent   with tamponade physiology.   Impressions:   - No significant change since the prior study on 07/31/2017.     ASSESSMENT AND PLAN:  1. CAD. S/p DES of ostial RCA on 08/08/17. No significant angina. Continue ASA.   Continue statin. Encouraged continued exercise program. Residual disease in a diagonal branch will be managed medically.  2. CHB s/p DDD pacemaker insertion on 08/11/17. Normal Pacer follow up 3. Mild-moderate  Mitral stenosis on Echo. Right heart cath without significant stenosis/vavle gradient. No new symptoms. Will update Echo today. Last study in 1610.  4. Diastolic dysfunction 5. RA. Follow up with  Dr. Amil Amen. On Orencia.  6. 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 Update Echo 7. HLD on statin. Will update lipid panel.   Current medicines are reviewed at length with the patient today.  The patient does not have concerns regarding medicines.  The following changes have been made:  no change  Labs/ tests ordered today include:   No orders of the defined types were placed in this encounter.     Disposition:   FU with me 6 months  Signed, Sharaine Delange [place], MD  03/14/2022 3:08 PM    [facility] [address], [place], [place], [address] Phone [phone], Fax [phone]

## 2022-03-18 ENCOUNTER — Ambulatory Visit: Payer: Medicare Other | Admitting: Cardiology

## 2022-03-19 DIAGNOSIS — S6991XA Unspecified injury of right wrist, hand and finger(s), initial encounter: Secondary | ICD-10-CM | POA: Diagnosis not present

## 2022-03-19 DIAGNOSIS — S52501A Unspecified fracture of the lower end of right radius, initial encounter for closed fracture: Secondary | ICD-10-CM | POA: Diagnosis not present

## 2022-03-21 DIAGNOSIS — M0579 Rheumatoid arthritis with rheumatoid factor of multiple sites without organ or systems involvement: Secondary | ICD-10-CM | POA: Diagnosis not present

## 2022-03-26 DIAGNOSIS — S52501A Unspecified fracture of the lower end of right radius, initial encounter for closed fracture: Secondary | ICD-10-CM | POA: Diagnosis not present

## 2022-03-27 ENCOUNTER — Encounter: Payer: Self-pay | Admitting: Physician Assistant

## 2022-03-27 ENCOUNTER — Ambulatory Visit: Payer: Medicare Other | Attending: Cardiology | Admitting: Physician Assistant

## 2022-03-27 VITALS — BP 138/68 | HR 60 | Ht 62.0 in | Wt 116.0 lb

## 2022-03-27 DIAGNOSIS — Z950 Presence of cardiac pacemaker: Secondary | ICD-10-CM | POA: Diagnosis not present

## 2022-03-27 DIAGNOSIS — I2510 Atherosclerotic heart disease of native coronary artery without angina pectoris: Secondary | ICD-10-CM | POA: Diagnosis not present

## 2022-03-27 DIAGNOSIS — R42 Dizziness and giddiness: Secondary | ICD-10-CM | POA: Diagnosis not present

## 2022-03-27 DIAGNOSIS — Z9861 Coronary angioplasty status: Secondary | ICD-10-CM

## 2022-03-27 DIAGNOSIS — I10 Essential (primary) hypertension: Secondary | ICD-10-CM | POA: Insufficient documentation

## 2022-03-27 DIAGNOSIS — I3139 Other pericardial effusion (noninflammatory): Secondary | ICD-10-CM | POA: Insufficient documentation

## 2022-03-27 NOTE — Progress Notes (Signed)
Cardiology Office Note:    Date:  03/29/2022   ID:  Elizabeth Estrada, DOB 1940-06-10, MRN [id number]  PCP:  [name], [facility] Cardiologist:  Peter Martinique, MD Electrophysiologist:  Will Meredith Leeds, MD     Referring MD: [name], MD   Chief Complaint  Patient presents with   Follow-up    Seen for Dr. Martinique    History of Present Illness:    Elizabeth Estrada is a 81 y.o. female with a hx of CAD, CHB s/p MDT dual-chamber PPM, MVP/MS, fibromyalgia, GERD, hypertension and rheumatoid arthritis.  Echocardiogram in 2006 mention mild prolapse, however no prolapse was seen on repeat echocardiogram in 2013.  Stress echocardiogram around 2013 was normal.  She was seen in January 2019 for palpitation and April 2019 for exertional chest pressure.  ETT demonstrated very poor exercise tolerance with hypertensive BP response, 2-1 AV block was noted.  An event monitor showed episodes of complete heart block.  She underwent left and right heart cath in April 2019 which showed 80% ostial RCA stenosis, 70% lesion in the diagonal vessel, mild pulmonary hypertension without significant mitral stenosis.  EDP was elevated consistent with diastolic dysfunction.  Patient underwent stenting of the ostial RCA with drug-eluting stent.  Postprocedure, she had persistent episode of complete heart block and eventually underwent pacemaker implantation on 08/11/2017. Repeat echocardiogram in August 2019 showed moderate mitral stenosis, mild to moderate pericardial effusion.  Patient was last seen by Dr. Martinique in May 2023 at which time she was doing well.  Repeat echocardiogram obtained on 09/12/2021 showed EF 60 to 65%, grade 2 DD, RVSP 38.5 mmHg, moderate mitral stenosis, moderate TR, mild AI, moderate pericardial effusion with no tamponade.  Patient was recently seen by Dr. Curt Bears in August 2023 at which time she was doing well.  Patient is today for follow-up.  She had an  episode of dizziness and shortness of breath 4 months ago that lasted a few days.  Symptoms self resolved and she is back to her baseline.  Given the self resolving nature, I suspect her symptom was not related to cardiac issues.  She denies any recent chest pain or worsening dyspnea with exertion.  Heart rate is quite regular on physical exam.  She can follow-up in 6 months.  Blood pressure is borderline high today, however it was previously normal.  I asked her to keep an eye on her blood pressure and if after 2 weeks systolic blood pressure still greater than 140s, she can call us back and schedule appointment with our clinical pharmacist in the hypertension clinic.  She is currently not on any blood pressure medication.   Past Medical History:  Diagnosis Date   AV block    a. s/p MDT dual chamber PPM (HIS bundle lead)   Chronic depression    Coronary artery disease    Family history of adverse reaction to anesthesia    "dad had PONV"   Fibromyalgia    GERD (gastroesophageal reflux disease)    Heart murmur    History of kidney stones    Hypertension    "before stent and pacemaker" (10/13/2017)   Melanoma ([place])    removed from back   MVP (mitral valve prolapse)    Osteoarthritis    Osteoporosis    Presence of permanent cardiac pacemaker 08/08/2017   RA (rheumatoid arthritis) ([place])    "all over; mainly hands and knees" (10/13/2017)   Rib fracture  Past Surgical History:  Procedure Laterality Date   BREAST BIOPSY Left 11/2003   benign - Dr. Margot Chimes   CARDIAC CATHETERIZATION     CARDIOVASCULAR STRESS TEST  10/19/1999, 2013   EF 97%, NO EVIDENCE OF ISCHEMIA, 2013 NORMAL   CATARACT EXTRACTION W/ INTRAOCULAR LENS  IMPLANT, BILATERAL Bilateral    COLONOSCOPY     CORONARY STENT INTERVENTION N/A 08/08/2017   Procedure: CORONARY STENT INTERVENTION;  Surgeon: [name], MD;  Location: [facility];  Service: Cardiovascular;  Laterality: N/A;   DILATATION & CURRETTAGE/HYSTEROSCOPY  WITH RESECTOCOPE N/A 07/06/2013   Procedure: DILATATION & CURETTAGE/HYSTEROSCOPY WITH RESECTOCOPE with removal of endometrial lesion;  Surgeon: [name], MD;  Location: [facility];  Service: Gynecology;  Laterality: N/A;   LEAD REVISION/REPAIR N/A 10/13/2017   Procedure: LEAD REVISION/REPAIR;  Surgeon: [name], MD;  Location: [facility];  Service: Cardiovascular;  Laterality: N/A;   MELANOMA EXCISION  1990s?   from back   PACEMAKER IMPLANT N/A 08/11/2017   Procedure: PACEMAKER IMPLANT;  Surgeon: [name], MD;  Location: [facility];  Service: Cardiovascular;  Laterality: N/A;   RIGHT/LEFT HEART CATH AND CORONARY ANGIOGRAPHY N/A 08/08/2017   Procedure: RIGHT/LEFT HEART CATH AND CORONARY ANGIOGRAPHY;  Surgeon: [name], MD;  Location: [facility];  Service: Cardiovascular;  Laterality: N/A;    Current Medications: No outpatient medications have been marked as taking for the 03/27/22 encounter (Office Visit) with Almyra Deforest, [place].     Allergies:   Methotrexate derivatives, Plaquenil [hydroxychloroquine sulfate], Remicade [infliximab], Ridaura [auranofin], and Sudafed [pseudoephedrine]   Social History   Socioeconomic History   Marital status: Married    Spouse name: Not on file   Number of children: 0   Years of education: Not on file   Highest education level: High school graduate  Occupational History   Occupation: Retired  Tobacco Use   Smoking status: Never   Smokeless tobacco: Never  Vaping Use   Vaping Use: Never used  Substance and Sexual Activity   Alcohol use: Never   Drug use: Never   Sexual activity: Not on file  Other Topics Concern   Not on file  Social History Narrative   Not on file   Social Determinants of Health   Financial Resource Strain: Not on file  Food Insecurity: No Food Insecurity (11/06/2017)   Hunger Vital Sign    Worried About Running Out of Food in the Last Year: Never true    Ran Out of Food  in the Last Year: Never true  Transportation Needs: No Transportation Needs (11/06/2017)   PRAPARE - Hydrologist (Medical): No    Lack of Transportation (Non-Medical): No  Physical Activity: Sufficiently Active (11/06/2017)   Exercise Vital Sign    Days of Exercise per Week: 5 days    Minutes of Exercise per Session: 30 min  Stress: Stress Concern Present (11/06/2017)   [place]    Feeling of Stress : To some extent  Social Connections: Not on file     Family History: The patient's family history includes CAD (age of onset: 61) in her brother; Coronary artery disease in her mother; Heart disease in her father; Hypertension in her father; Pancreatic cancer in her father.  ROS:   Please see the history of present illness.     All other systems reviewed and are negative.  EKGs/Labs/Other Studies  Reviewed:    The following studies were reviewed today:  Echo 09/12/2021 1. Left ventricular ejection fraction, by estimation, is 60 to 65%. The  left ventricle has normal function. The left ventricle has no regional  wall motion abnormalities. Left ventricular diastolic parameters are  consistent with Grade II diastolic  dysfunction (pseudonormalization). Elevated left atrial pressure.   2. Right ventricular systolic function is normal. The right ventricular  size is normal. There is mildly elevated pulmonary artery systolic  pressure. The estimated right ventricular systolic pressure is 95.6 mmHg.   3. Moderate pericardial effusion. There is no evidence of cardiac  tamponade.   4. The mitral valve is abnormal. Moderate leaflet thickening, No evidence  of mitral valve regurgitation. Moderate mitral stenosis. The mean mitral  valve gradient is 7.0 mmHg with average heart rate of 60 bpm. MVA 1.4 cm^2  by continuity equation.   5. Tricuspid valve regurgitation is moderate.   6. The aortic valve is  tricuspid. Aortic valve regurgitation is mild.  Aortic valve sclerosis is present, with no evidence of aortic valve  stenosis.   7. The inferior vena cava is normal in size with greater than 50%  respiratory variability, suggesting right atrial pressure of 3 mmHg.   EKG:  EKG is not ordered today.    Recent Labs: 08/24/2021: ALT 17; BUN 18; Creatinine, Ser 0.81; Potassium 4.4; Sodium 140  Recent Lipid Panel    Component Value Date/Time   CHOL 147 08/24/2021 0843   TRIG 34 08/24/2021 0843   HDL 74 08/24/2021 0843   CHOLHDL 2.0 08/24/2021 0843   LDLCALC 64 08/24/2021 0843     Risk Assessment/Calculations:           Physical Exam:    VS:  BP 138/68   Pulse 60   Ht '5\' 2"'$  (1.575 m)   Wt 116 lb (52.6 kg)   LMP 04/15/1990   SpO2 100%   BMI 21.22 kg/m        Wt Readings from Last 3 Encounters:  03/27/22 116 lb (52.6 kg)  11/20/21 113 lb 3.2 oz (51.3 kg)  08/24/21 113 lb 6.4 oz (51.4 kg)     GEN:  Well nourished, well developed in no acute distress HEENT: Normal NECK: No JVD; No carotid bruits LYMPHATICS: No lymphadenopathy CARDIAC: RRR, no murmurs, rubs, gallops RESPIRATORY:  Clear to auscultation without rales, wheezing or rhonchi  ABDOMEN: Soft, non-tender, non-distended MUSCULOSKELETAL:  No edema; No deformity  SKIN: Warm and dry NEUROLOGIC:  Alert and oriented x 3 PSYCHIATRIC:  Normal affect   ASSESSMENT:    1. Dizziness   2. Coronary artery disease involving native coronary artery of native heart without angina pectoris   3. Pacemaker   4. Primary hypertension   5. Pericardial effusion    PLAN:    In order of problems listed above:  Dizziness: She had a episode of dizziness about 4 months ago that lasted several days, the symptom has since resolved and it has never recurred again.  She denied any chest discomfort or worsening dyspnea.  Given resolution of the symptoms several months ago, I did not order additional workup at this time.  CAD: Denies  any recent chest pain.  Continue aspirin and Lipitor  History of pacemaker: Followed by Dr. 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  She does not have any worsening shortness of breath.  The degree of pericardial effusion is unchanged on the last echocardiogram in May 2023.           Medication Adjustments/Labs and Tests Ordered: Current medicines are reviewed at length with the patient today.  Concerns regarding medicines are outlined above.  No orders of the defined types were placed in this encounter.  No orders of the defined types were placed in this encounter.   Patient Instructions  Medication Instructions:  Your physician recommends that you continue on your current medications as directed. Please refer to the Current Medication list given to you today.  *If you need a refill on your cardiac medications before your next appointment, please call your pharmacy*  Lab Work: NONE ordered at this time of appointment   If you have labs (blood work) drawn today and your tests are completely normal, you will receive your results only by: [place] (if you have MyChart) OR A paper copy in the mail If you have any lab test that is abnormal or we need to change your treatment, we will call you to review the results.  Testing/Procedures: NONE ordered at this time of appointment   Follow-Up: At [facility], you and your health needs are our priority.  As part of our continuing mission to provide you with exceptional heart care, we have created designated Provider Care Teams.  These Care Teams include your primary Cardiologist (physician) and Advanced Practice Providers (APPs -   Physician Assistants and Nurse Practitioners) who all work together to provide you with the care you need, when you need it.  We recommend signing up for the patient portal called "MyChart".  Sign up information is provided on this After Visit Summary.  MyChart is used to connect with patients for Virtual Visits (Telemedicine).  Patients are able to view lab/test results, encounter notes, upcoming appointments, etc.  Non-urgent messages can be sent to your provider as well.   To learn more about what you can do with MyChart, go to [email or web address].    Your next appointment:   6 month(s)  The format for your next appointment:   In Person  Provider:   Peter Martinique, MD     Other Instructions Monitor blood pressure at home daily. If systolic (top number) for the next 2 weeks is 140 or greater consistently give our office a call to be scheduled for a Hypertension Pharm D appointment    Low-Sodium Eating Plan  Sodium, which is an element that makes up salt, helps you maintain a healthy balance of fluids in your body. Too much sodium can increase your blood pressure and cause fluid and waste to be held in your body. Your health care provider or dietitian may recommend following this plan if you have high blood pressure (hypertension), kidney disease, liver disease, or heart failure. Eating less sodium can help lower your blood pressure, reduce swelling, and protect your heart, liver, and kidneys. What are tips for following this plan? Reading food labels The Nutrition Facts label lists the amount of sodium in one serving of the food. If you eat more than one serving, you must multiply the listed amount of sodium by the number of servings. Choose foods with less than 140 mg of sodium per serving. Avoid foods with 300 mg of sodium or more per serving. Shopping  Look for lower-sodium products, often labeled as "low-sodium" or "no salt added." Always check the sodium content, even if foods  are labeled as "unsalted" or "no salt added." Buy fresh foods. Avoid canned foods and pre-made or frozen meals. Avoid canned, cured, or processed meats. Buy breads that have less than 80 mg of sodium per slice. Cooking  Eat more home-cooked food and less restaurant, buffet, and fast food. Avoid adding salt when cooking. Use salt-free seasonings or herbs instead of table salt or sea salt. Check with your health care provider or pharmacist before using salt substitutes. Cook with plant-based oils, such as canola, sunflower, or olive oil. Meal planning When eating at a restaurant, ask that your food be prepared with less salt or no salt, if possible. Avoid dishes labeled as brined, pickled, cured, smoked, or made with soy sauce, miso, or teriyaki sauce. Avoid foods that contain MSG (monosodium glutamate). MSG is sometimes added to [place] food, bouillon, and some canned foods. Make meals that can be grilled, baked, poached, roasted, or steamed. These are generally made with less sodium. General information Most people on this plan should limit their sodium intake to 1,500-2,000 mg (milligrams) of sodium each day. What foods should I eat? Fruits Fresh, frozen, or canned fruit. Fruit juice. Vegetables Fresh or frozen vegetables. "No salt added" canned vegetables. "No salt added" tomato sauce and paste. Low-sodium or reduced-sodium tomato and vegetable juice. Grains Low-sodium cereals, including oats, puffed wheat and rice, and shredded wheat. Low-sodium crackers. Unsalted rice. Unsalted pasta. Low-sodium bread. Whole-grain breads and whole-grain pasta. Meats and other proteins Fresh or frozen (no salt added) meat, poultry, seafood, and fish. Low-sodium canned tuna and salmon. Unsalted nuts. Dried peas, beans, and lentils without added salt. Unsalted canned beans. Eggs. Unsalted nut butters. Dairy Milk. Soy milk. Cheese that is naturally low in sodium, such as ricotta cheese, fresh mozzarella, or  Swiss cheese. Low-sodium or reduced-sodium cheese. Cream cheese. Yogurt. Seasonings and condiments Fresh and dried herbs and spices. Salt-free seasonings. Low-sodium mustard and ketchup. Sodium-free salad dressing. Sodium-free light mayonnaise. Fresh or refrigerated horseradish. Lemon juice. Vinegar. Other foods Homemade, reduced-sodium, or low-sodium soups. Unsalted popcorn and pretzels. Low-salt or salt-free chips. The items listed above may not be a complete list of foods and beverages you can eat. Contact a dietitian for more information. What foods should I avoid? Vegetables Sauerkraut, pickled vegetables, and relishes. Olives. [place] fries. Onion rings. Regular canned vegetables (not low-sodium or reduced-sodium). Regular canned tomato sauce and paste (not low-sodium or reduced-sodium). Regular tomato and vegetable juice (not low-sodium or reduced-sodium). Frozen vegetables in sauces. Grains Instant hot cereals. Bread stuffing, pancake, and biscuit mixes. Croutons. Seasoned rice or pasta mixes. Noodle soup cups. Boxed or frozen macaroni and cheese. Regular salted crackers. Self-rising flour. Meats and other proteins Meat or fish that is salted, canned, smoked, spiced, or pickled. Precooked or cured meat, such as sausages or meat loaves. Berniece Salines. Ham. Pepperoni. Hot dogs. Corned beef. Chipped beef. Salt pork. Jerky. Pickled herring. Anchovies and sardines. Regular canned tuna. Salted nuts. Dairy Processed cheese and cheese spreads. Hard cheeses. Cheese curds. Blue cheese. Feta cheese. String cheese. Regular cottage cheese. Buttermilk. Canned milk. Fats and oils Salted butter. Regular margarine. Ghee. Bacon fat. Seasonings and condiments Onion salt, garlic salt, seasoned salt, table salt, and sea salt. Canned and packaged gravies. Worcestershire sauce. Tartar sauce. Barbecue sauce. Teriyaki sauce. Soy sauce, including reduced-sodium. Steak sauce. Fish sauce. Oyster sauce. Cocktail sauce.  Horseradish that you find on the shelf.  Regular ketchup and mustard. Meat flavorings and tenderizers. Bouillon cubes. Hot sauce. Pre-made or packaged marinades. Pre-made or packaged taco seasonings. Relishes. Regular salad dressings. Salsa. Other foods Salted popcorn and pretzels. Corn chips and puffs. Potato and tortilla chips. Canned or dried soups. Pizza. Frozen entrees and pot pies. The items listed above may not be a complete list of foods and beverages you should avoid. Contact a dietitian for more information. Summary Eating less sodium can help lower your blood pressure, reduce swelling, and protect your heart, liver, and kidneys. Most people on this plan should limit their sodium intake to 1,500-2,000 mg (milligrams) of sodium each day. Canned, boxed, and frozen foods are high in sodium. Restaurant foods, fast foods, and pizza are also very high in sodium. You also get sodium by adding salt to food. Try to cook at home, eat more fresh fruits and vegetables, and eat less fast food and canned, processed, or prepared foods. This information is not intended to replace advice given to you by your health care provider. Make sure you discuss any questions you have with your health care provider. Document Revised: 05/07/2019 Document Reviewed: 03/03/2019 Elsevier Patient Education  [place]         Signed, [place], [place]  03/29/2022 9:55 PM    [place]

## 2022-03-27 NOTE — Patient Instructions (Signed)
Medication Instructions:  Your physician recommends that you continue on your current medications as directed. Please refer to the Current Medication list given to you today.  *If you need a refill on your cardiac medications before your next appointment, please call your pharmacy*  Lab Work: NONE ordered at this time of appointment   If you have labs (blood work) drawn today and your tests are completely normal, you will receive your results only by: [place] (if you have MyChart) OR A paper copy in the mail If you have any lab test that is abnormal or we need to change your treatment, we will call you to review the results.  Testing/Procedures: NONE ordered at this time of appointment   Follow-Up: At [facility], you and your health needs are our priority.  As part of our continuing mission to provide you with exceptional heart care, we have created designated Provider Care Teams.  These Care Teams include your primary Cardiologist (physician) and Advanced Practice Providers (APPs -  Physician Assistants and Nurse Practitioners) who all work together to provide you with the care you need, when you need it.  We recommend signing up for the patient portal called "MyChart".  Sign up information is provided on this After Visit Summary.  MyChart is used to connect with patients for Virtual Visits (Telemedicine).  Patients are able to view lab/test results, encounter notes, upcoming appointments, etc.  Non-urgent messages can be sent to your provider as well.   To learn more about what you can do with MyChart, go to [email or web address].    Your next appointment:   6 month(s)  The format for your next appointment:   In Person  Provider:   Peter Martinique, MD     Other Instructions Monitor blood pressure at home daily. If systolic (top number) for the next 2 weeks is 140 or greater consistently give our office a call to be scheduled for a Hypertension Pharm D appointment     Low-Sodium Eating Plan  Sodium, which is an element that makes up salt, helps you maintain a healthy balance of fluids in your body. Too much sodium can increase your blood pressure and cause fluid and waste to be held in your body. Your health care provider or dietitian may recommend following this plan if you have high blood pressure (hypertension), kidney disease, liver disease, or heart failure. Eating less sodium can help lower your blood pressure, reduce swelling, and protect your heart, liver, and kidneys. What are tips for following this plan? Reading food labels The Nutrition Facts label lists the amount of sodium in one serving of the food. If you eat more than one serving, you must multiply the listed amount of sodium by the number of servings. Choose foods with less than 140 mg of sodium per serving. Avoid foods with 300 mg of sodium or more per serving. Shopping  Look for lower-sodium products, often labeled as "low-sodium" or "no salt added." Always check the sodium content, even if foods are labeled as "unsalted" or "no salt added." Buy fresh foods. Avoid canned foods and pre-made or frozen meals. Avoid canned, cured, or processed meats. Buy breads that have less than 80 mg of sodium per slice. Cooking  Eat more home-cooked food and less restaurant, buffet, and fast food. Avoid adding salt when cooking. Use salt-free seasonings or herbs instead of table salt or sea salt. Check with your health care provider or pharmacist before using salt substitutes. Cook with plant-based oils, such  as canola, sunflower, or olive oil. Meal planning When eating at a restaurant, ask that your food be prepared with less salt or no salt, if possible. Avoid dishes labeled as brined, pickled, cured, smoked, or made with soy sauce, miso, or teriyaki sauce. Avoid foods that contain MSG (monosodium glutamate). MSG is sometimes added to [place] food, bouillon, and some canned foods. Make meals  that can be grilled, baked, poached, roasted, or steamed. These are generally made with less sodium. General information Most people on this plan should limit their sodium intake to 1,500-2,000 mg (milligrams) of sodium each day. What foods should I eat? Fruits Fresh, frozen, or canned fruit. Fruit juice. Vegetables Fresh or frozen vegetables. "No salt added" canned vegetables. "No salt added" tomato sauce and paste. Low-sodium or reduced-sodium tomato and vegetable juice. Grains Low-sodium cereals, including oats, puffed wheat and rice, and shredded wheat. Low-sodium crackers. Unsalted rice. Unsalted pasta. Low-sodium bread. Whole-grain breads and whole-grain pasta. Meats and other proteins Fresh or frozen (no salt added) meat, poultry, seafood, and fish. Low-sodium canned tuna and salmon. Unsalted nuts. Dried peas, beans, and lentils without added salt. Unsalted canned beans. Eggs. Unsalted nut butters. Dairy Milk. Soy milk. Cheese that is naturally low in sodium, such as ricotta cheese, fresh mozzarella, or Swiss cheese. Low-sodium or reduced-sodium cheese. Cream cheese. Yogurt. Seasonings and condiments Fresh and dried herbs and spices. Salt-free seasonings. Low-sodium mustard and ketchup. Sodium-free salad dressing. Sodium-free light mayonnaise. Fresh or refrigerated horseradish. Lemon juice. Vinegar. Other foods Homemade, reduced-sodium, or low-sodium soups. Unsalted popcorn and pretzels. Low-salt or salt-free chips. The items listed above may not be a complete list of foods and beverages you can eat. Contact a dietitian for more information. What foods should I avoid? Vegetables Sauerkraut, pickled vegetables, and relishes. Olives. [place] fries. Onion rings. Regular canned vegetables (not low-sodium or reduced-sodium). Regular canned tomato sauce and paste (not low-sodium or reduced-sodium). Regular tomato and vegetable juice (not low-sodium or reduced-sodium). Frozen vegetables in  sauces. Grains Instant hot cereals. Bread stuffing, pancake, and biscuit mixes. Croutons. Seasoned rice or pasta mixes. Noodle soup cups. Boxed or frozen macaroni and cheese. Regular salted crackers. Self-rising flour. Meats and other proteins Meat or fish that is salted, canned, smoked, spiced, or pickled. Precooked or cured meat, such as sausages or meat loaves. Berniece Salines. Ham. Pepperoni. Hot dogs. Corned beef. Chipped beef. Salt pork. Jerky. Pickled herring. Anchovies and sardines. Regular canned tuna. Salted nuts. Dairy Processed cheese and cheese spreads. Hard cheeses. Cheese curds. Blue cheese. Feta cheese. String cheese. Regular cottage cheese. Buttermilk. Canned milk. Fats and oils Salted butter. Regular margarine. Ghee. Bacon fat. Seasonings and condiments Onion salt, garlic salt, seasoned salt, table salt, and sea salt. Canned and packaged gravies. Worcestershire sauce. Tartar sauce. Barbecue sauce. Teriyaki sauce. Soy sauce, including reduced-sodium. Steak sauce. Fish sauce. Oyster sauce. Cocktail sauce. Horseradish that you find on the shelf. Regular ketchup and mustard. Meat flavorings and tenderizers. Bouillon cubes. Hot sauce. Pre-made or packaged marinades. Pre-made or packaged taco seasonings. Relishes. Regular salad dressings. Salsa. Other foods Salted popcorn and pretzels. Corn chips and puffs. Potato and tortilla chips. Canned or dried soups. Pizza. Frozen entrees and pot pies. The items listed above may not be a complete list of foods and beverages you should avoid. Contact a dietitian for more information. Summary Eating less sodium can help lower your blood pressure, reduce swelling, and protect your heart, liver, and kidneys. Most people on this plan should limit their sodium intake to 1,500-2,000 mg (  milligrams) of sodium each day. Canned, boxed, and frozen foods are high in sodium. Restaurant foods, fast foods, and pizza are also very high in sodium. You also get sodium by  adding salt to food. Try to cook at home, eat more fresh fruits and vegetables, and eat less fast food and canned, processed, or prepared foods. This information is not intended to replace advice given to you by your health care provider. Make sure you discuss any questions you have with your health care provider. Document Revised: 05/07/2019 Document Reviewed: 03/03/2019 Elsevier Patient Education  [place]

## 2022-03-29 ENCOUNTER — Encounter: Payer: Self-pay | Admitting: Physician Assistant

## 2022-04-16 ENCOUNTER — Ambulatory Visit (INDEPENDENT_AMBULATORY_CARE_PROVIDER_SITE_OTHER): Payer: Medicare Other

## 2022-04-16 DIAGNOSIS — I442 Atrioventricular block, complete: Secondary | ICD-10-CM

## 2022-04-16 LAB — CUP PACEART REMOTE DEVICE CHECK
Battery Remaining Longevity: 76 mo
Battery Voltage: 2.97 V
Brady Statistic AP VP Percent: 30.44 %
Brady Statistic AP VS Percent: 0.01 %
Brady Statistic AS VP Percent: 69.53 %
Brady Statistic AS VS Percent: 0.02 %
Brady Statistic RA Percent Paced: 30.25 %
Brady Statistic RV Percent Paced: 99.98 %
Date Time Interrogation Session: 20240101225022
Implantable Lead Connection Status: 753985
Implantable Lead Connection Status: 753985
Implantable Lead Implant Date: 20190429
Implantable Lead Implant Date: 20190429
Implantable Lead Location: 753859
Implantable Lead Location: 753860
Implantable Lead Model: 5076
Implantable Lead Model: 5076
Implantable Pulse Generator Implant Date: 20190429
Lead Channel Impedance Value: 323 Ohm
Lead Channel Impedance Value: 437 Ohm
Lead Channel Impedance Value: 456 Ohm
Lead Channel Impedance Value: 513 Ohm
Lead Channel Pacing Threshold Amplitude: 0.5 V
Lead Channel Pacing Threshold Amplitude: 0.5 V
Lead Channel Pacing Threshold Pulse Width: 0.4 ms
Lead Channel Pacing Threshold Pulse Width: 0.4 ms
Lead Channel Sensing Intrinsic Amplitude: 2.125 mV
Lead Channel Sensing Intrinsic Amplitude: 2.125 mV
Lead Channel Sensing Intrinsic Amplitude: 23 mV
Lead Channel Sensing Intrinsic Amplitude: 23 mV
Lead Channel Setting Pacing Amplitude: 1.5 V
Lead Channel Setting Pacing Amplitude: 2.5 V
Lead Channel Setting Pacing Pulse Width: 0.4 ms
Lead Channel Setting Sensing Sensitivity: 1.2 mV
Zone Setting Status: 755011
Zone Setting Status: 755011

## 2022-04-18 DIAGNOSIS — M0579 Rheumatoid arthritis with rheumatoid factor of multiple sites without organ or systems involvement: Secondary | ICD-10-CM | POA: Diagnosis not present

## 2022-04-25 DIAGNOSIS — S52501D Unspecified fracture of the lower end of right radius, subsequent encounter for closed fracture with routine healing: Secondary | ICD-10-CM | POA: Diagnosis not present

## 2022-04-30 DIAGNOSIS — M5136 Other intervertebral disc degeneration, lumbar region: Secondary | ICD-10-CM | POA: Diagnosis not present

## 2022-04-30 DIAGNOSIS — M25461 Effusion, right knee: Secondary | ICD-10-CM | POA: Diagnosis not present

## 2022-04-30 DIAGNOSIS — Z6820 Body mass index (BMI) 20.0-20.9, adult: Secondary | ICD-10-CM | POA: Diagnosis not present

## 2022-04-30 DIAGNOSIS — M810 Age-related osteoporosis without current pathological fracture: Secondary | ICD-10-CM | POA: Diagnosis not present

## 2022-04-30 DIAGNOSIS — M0579 Rheumatoid arthritis with rheumatoid factor of multiple sites without organ or systems involvement: Secondary | ICD-10-CM | POA: Diagnosis not present

## 2022-04-30 DIAGNOSIS — M1991 Primary osteoarthritis, unspecified site: Secondary | ICD-10-CM | POA: Diagnosis not present

## 2022-05-06 DIAGNOSIS — S52501D Unspecified fracture of the lower end of right radius, subsequent encounter for closed fracture with routine healing: Secondary | ICD-10-CM | POA: Diagnosis not present

## 2022-05-15 DIAGNOSIS — S52501D Unspecified fracture of the lower end of right radius, subsequent encounter for closed fracture with routine healing: Secondary | ICD-10-CM | POA: Diagnosis not present

## 2022-05-15 NOTE — Progress Notes (Signed)
Remote pacemaker transmission.   

## 2022-05-16 DIAGNOSIS — M0579 Rheumatoid arthritis with rheumatoid factor of multiple sites without organ or systems involvement: Secondary | ICD-10-CM | POA: Diagnosis not present

## 2022-05-24 ENCOUNTER — Ambulatory Visit
Admission: RE | Admit: 2022-05-24 | Discharge: 2022-05-24 | Disposition: A | Discharge status: Home / Self Care | Payer: Medicare Other | Source: Ambulatory Visit | Attending: Internal Medicine | Admitting: Internal Medicine

## 2022-05-24 DIAGNOSIS — S52501D Unspecified fracture of the lower end of right radius, subsequent encounter for closed fracture with routine healing: Secondary | ICD-10-CM | POA: Diagnosis not present

## 2022-05-24 DIAGNOSIS — Z780 Asymptomatic menopausal state: Secondary | ICD-10-CM | POA: Diagnosis not present

## 2022-05-24 DIAGNOSIS — M8589 Other specified disorders of bone density and structure, multiple sites: Secondary | ICD-10-CM | POA: Diagnosis not present

## 2022-05-24 DIAGNOSIS — M810 Age-related osteoporosis without current pathological fracture: Secondary | ICD-10-CM

## 2022-06-04 DIAGNOSIS — S52501D Unspecified fracture of the lower end of right radius, subsequent encounter for closed fracture with routine healing: Secondary | ICD-10-CM | POA: Diagnosis not present

## 2022-06-11 DIAGNOSIS — S52501D Unspecified fracture of the lower end of right radius, subsequent encounter for closed fracture with routine healing: Secondary | ICD-10-CM | POA: Diagnosis not present

## 2022-06-13 ENCOUNTER — Other Ambulatory Visit: Payer: Self-pay | Admitting: Cardiology

## 2022-06-13 DIAGNOSIS — R143 Flatulence: Secondary | ICD-10-CM | POA: Diagnosis not present

## 2022-06-13 DIAGNOSIS — K5901 Slow transit constipation: Secondary | ICD-10-CM | POA: Diagnosis not present

## 2022-06-14 DIAGNOSIS — M0579 Rheumatoid arthritis with rheumatoid factor of multiple sites without organ or systems involvement: Secondary | ICD-10-CM | POA: Diagnosis not present

## 2022-06-26 ENCOUNTER — Telehealth: Payer: Self-pay

## 2022-06-26 NOTE — Patient Outreach (Signed)
  Care Coordination   06/26/2022 Name: Elizabeth Estrada MRN: [id number] DOB: May 21, 1940   Care Coordination Outreach Attempts:  An unsuccessful telephone outreach was attempted today to offer the patient information about available care coordination services as a benefit of their health plan.   Follow Up Plan:  Additional outreach attempts will be made to offer the patient care coordination information and services.   Encounter Outcome:  No Answer   Care Coordination Interventions:  No, not indicated    SIG  Peter Garter RN, BSN,CCM, CDE Care Management Coordinator [facility] [phone]

## 2022-07-03 ENCOUNTER — Telehealth: Payer: Self-pay

## 2022-07-03 NOTE — Patient Outreach (Signed)
  Care Coordination   07/03/2022 Name: Elizabeth Estrada MRN: [id number] DOB: 1941-04-12   Care Coordination Outreach Attempts:  A second unsuccessful outreach was attempted today to offer the patient with information about available care coordination services as a benefit of their health plan.     Follow Up Plan:  Additional outreach attempts will be made to offer the patient care coordination information and services.   Encounter Outcome:  No Answer   Care Coordination Interventions:  No, not indicated    SIG  Peter Garter RN, BSN,CCM, CDE Care Management Coordinator [facility] [facility] [phone]

## 2022-07-08 ENCOUNTER — Telehealth: Payer: Self-pay

## 2022-07-08 NOTE — Patient Outreach (Signed)
  Care Coordination   Initial Visit Note   07/08/2022 Name: DELISA KENDZIERSKI MRN: [id number] DOB: 11/22/1940  DAYAH JACKA is a 82 y.o. year old female who sees [name], MD for primary care. I spoke with  Ballard Russell by phone today.  What matters to the patients health and wellness today?  No concerns today    Goals Addressed             This Visit's Progress    COMPLETED: Care Coordination Activities- No Follow Up Required       Interventions Today    Flowsheet Row Most Recent Value  General Interventions   General Interventions Discussed/Reviewed General Interventions Discussed, Doctor Visits  [Declines any further needs for [facility] follow up]  Doctor Visits Discussed/Reviewed Doctor Visits Discussed, Annual Wellness Visits, PCP  PCP/Specialist Visits Compliance with follow-up visit  Education Interventions   Education Provided Provided Education  Provided Verbal Education On Other  [care coordination services]              SDOH assessments and interventions completed:  Yes  SDOH Interventions Today    Flowsheet Row Most Recent Value  SDOH Interventions   Food Insecurity Interventions Intervention Not Indicated  Housing Interventions Intervention Not Indicated  Transportation Interventions Intervention Not Indicated  Utilities Interventions Intervention Not Indicated        Care Coordination Interventions:  Yes, provided   Follow up plan: No further intervention required.   Encounter Outcome:  Pt. Visit Completed  Peter Garter RN, BSN,CCM, CDE Care Management Coordinator [place] [facility] [phone]

## 2022-07-08 NOTE — Patient Instructions (Signed)
Visit Information  Thank you for taking time to visit with me today. Please don't hesitate to contact me if I can be of assistance to you.   Following are the goals we discussed today:   Goals Addressed             This Visit's Progress    COMPLETED: Care Coordination Activities- No Follow Up Required       Interventions Today    Flowsheet Row Most Recent Value  General Interventions   General Interventions Discussed/Reviewed General Interventions Discussed, Doctor Visits  [Declines any further needs for [facility] follow up]  Doctor Visits Discussed/Reviewed Doctor Visits Discussed, Annual Wellness Visits, PCP  PCP/Specialist Visits Compliance with follow-up visit  Education Interventions   Education Provided Provided Education  Provided Verbal Education On Other  [care coordination services]                If you are experiencing a Mental Health or [place] or need someone to talk to, please call the Suicide and Crisis Lifeline: 988 call the [place] National Suicide Prevention Lifeline: 906 811 6090 or TTY: (604)235-6816 [place] 325-582-6232) to talk to a trained counselor call 1-800-273-TALK (toll free, 24 hour hotline) go to [facility] [address] [place] [phone]) call the [facility]: [phone]   The patient verbalized understanding of instructions, educational materials, and care plan provided today and DECLINED offer to receive copy of patient instructions, educational materials, and care plan.   No further follow up required:    [place], Jackquline Denmark, [place] [facility] [phone]

## 2022-07-15 DIAGNOSIS — M0579 Rheumatoid arthritis with rheumatoid factor of multiple sites without organ or systems involvement: Secondary | ICD-10-CM | POA: Diagnosis not present

## 2022-07-16 ENCOUNTER — Ambulatory Visit (INDEPENDENT_AMBULATORY_CARE_PROVIDER_SITE_OTHER): Payer: Medicare Other

## 2022-07-16 DIAGNOSIS — I442 Atrioventricular block, complete: Secondary | ICD-10-CM | POA: Diagnosis not present

## 2022-07-16 LAB — CUP PACEART REMOTE DEVICE CHECK
Battery Remaining Longevity: 70 mo
Battery Voltage: 2.97 V
Brady Statistic AP VP Percent: 35.12 %
Brady Statistic AP VS Percent: 0.01 %
Brady Statistic AS VP Percent: 64.86 %
Brady Statistic AS VS Percent: 0.01 %
Brady Statistic RA Percent Paced: 34.93 %
Brady Statistic RV Percent Paced: 99.98 %
Date Time Interrogation Session: 20240402071658
Implantable Lead Connection Status: 753985
Implantable Lead Connection Status: 753985
Implantable Lead Implant Date: 20190429
Implantable Lead Implant Date: 20190429
Implantable Lead Location: 753859
Implantable Lead Location: 753860
Implantable Lead Model: 5076
Implantable Lead Model: 5076
Implantable Pulse Generator Implant Date: 20190429
Lead Channel Impedance Value: 342 Ohm
Lead Channel Impedance Value: 456 Ohm
Lead Channel Impedance Value: 475 Ohm
Lead Channel Impedance Value: 532 Ohm
Lead Channel Pacing Threshold Amplitude: 0.375 V
Lead Channel Pacing Threshold Amplitude: 0.5 V
Lead Channel Pacing Threshold Pulse Width: 0.4 ms
Lead Channel Pacing Threshold Pulse Width: 0.4 ms
Lead Channel Sensing Intrinsic Amplitude: 1.25 mV
Lead Channel Sensing Intrinsic Amplitude: 1.25 mV
Lead Channel Sensing Intrinsic Amplitude: 18.5 mV
Lead Channel Sensing Intrinsic Amplitude: 18.5 mV
Lead Channel Setting Pacing Amplitude: 1.5 V
Lead Channel Setting Pacing Amplitude: 2.5 V
Lead Channel Setting Pacing Pulse Width: 0.4 ms
Lead Channel Setting Sensing Sensitivity: 1.2 mV
Zone Setting Status: 755011
Zone Setting Status: 755011

## 2022-08-05 DIAGNOSIS — M549 Dorsalgia, unspecified: Secondary | ICD-10-CM | POA: Diagnosis not present

## 2022-08-05 DIAGNOSIS — M0579 Rheumatoid arthritis with rheumatoid factor of multiple sites without organ or systems involvement: Secondary | ICD-10-CM | POA: Diagnosis not present

## 2022-08-05 DIAGNOSIS — M5136 Other intervertebral disc degeneration, lumbar region: Secondary | ICD-10-CM | POA: Diagnosis not present

## 2022-08-05 DIAGNOSIS — Z6820 Body mass index (BMI) 20.0-20.9, adult: Secondary | ICD-10-CM | POA: Diagnosis not present

## 2022-08-05 DIAGNOSIS — M1991 Primary osteoarthritis, unspecified site: Secondary | ICD-10-CM | POA: Diagnosis not present

## 2022-08-05 DIAGNOSIS — M810 Age-related osteoporosis without current pathological fracture: Secondary | ICD-10-CM | POA: Diagnosis not present

## 2022-08-15 DIAGNOSIS — Z79899 Other long term (current) drug therapy: Secondary | ICD-10-CM | POA: Diagnosis not present

## 2022-08-15 DIAGNOSIS — M0579 Rheumatoid arthritis with rheumatoid factor of multiple sites without organ or systems involvement: Secondary | ICD-10-CM | POA: Diagnosis not present

## 2022-08-20 DIAGNOSIS — H40053 Ocular hypertension, bilateral: Secondary | ICD-10-CM | POA: Diagnosis not present

## 2022-08-20 DIAGNOSIS — H04123 Dry eye syndrome of bilateral lacrimal glands: Secondary | ICD-10-CM | POA: Diagnosis not present

## 2022-08-20 DIAGNOSIS — Z961 Presence of intraocular lens: Secondary | ICD-10-CM | POA: Diagnosis not present

## 2022-08-20 DIAGNOSIS — D492 Neoplasm of unspecified behavior of bone, soft tissue, and skin: Secondary | ICD-10-CM | POA: Diagnosis not present

## 2022-08-20 DIAGNOSIS — H26493 Other secondary cataract, bilateral: Secondary | ICD-10-CM | POA: Diagnosis not present

## 2022-08-27 NOTE — Progress Notes (Signed)
Remote pacemaker transmission.   

## 2022-09-13 DIAGNOSIS — M0579 Rheumatoid arthritis with rheumatoid factor of multiple sites without organ or systems involvement: Secondary | ICD-10-CM | POA: Diagnosis not present

## 2022-09-13 NOTE — Progress Notes (Unsigned)
Cardiology Office Note   Date:  09/13/2022   ID:  Elizabeth, Estrada [birth year], MRN [id number]  PCP:  Blair Heys, MD  Cardiologist:   Elizabeth Creasman [place], MD   No chief complaint on file.      History of Present Illness: Elizabeth Estrada is a 82 y.o. female who is seen for follow up CAD and complete heart block.   She has a history of MVP/MS and family history of CAD. She has a history of murmur. Echo in 2006 mentions mild prolapse but in 2013 she had an Echo showing a small pericardial effusion otherwise normal. No prolapse seen. She also had a normal stress Echo at that time.   She was seen in January 2019 with some palpitations. Seen again in April with symptoms of exertional chest pressure. Echo showed mild to moderate mitral stenosis. ETT was done and demonstrated very poor exercise tolerance with hypertensive BP response. There was 2:1 AV block noted. An event monitor showed episodes of complete heart block. She underwent right and left heart cath. This showed an 80% ostial RCA stenosis and 70% diagonal. There was mild pulmonary HTN without significant mitral stenosis. EDP was elevated c/w diastolic dysfunction. She underwent stenting of the ostial RCA with DES. She was placed on DAPT. Post stent she had persistent episodes of CHB despite correcting ischemia and underwent DDD pacemaker placement on 08/11/17.    She was seen by Corine Shelter PA in August 2019. Had persistent SOB. Repeat Echo showed no change. 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 Also has moderate mitral stenosis.   Pacemaker check on 07/17/21 was normal.   On follow up today she is feeling well from a cardiac standpoint. She was riding a stationary bike some but fell a couple of weeks ago and fractured her left patella. Also had eye surgery for glaucoma. Denies SOB or palpitations. No edema. Weight is stable.    Past Medical History:  Diagnosis Date   AV block    a. 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 Jamey Ripa   CARDIAC CATHETERIZATION     CARDIOVASCULAR STRESS TEST  10/19/1999, 2013   EF 97%, NO EVIDENCE OF ISCHEMIA, 2013 NORMAL   CATARACT EXTRACTION W/ INTRAOCULAR LENS  IMPLANT, BILATERAL Bilateral    COLONOSCOPY     CORONARY STENT INTERVENTION N/A 08/08/2017   Procedure: CORONARY STENT INTERVENTION;  Surgeon: Lyn Records, MD;  Location: [facility];  Service: Cardiovascular;  Laterality: N/A;   DILATATION & CURRETTAGE/HYSTEROSCOPY WITH RESECTOCOPE N/A 07/06/2013   Procedure: DILATATION & CURETTAGE/HYSTEROSCOPY WITH RESECTOCOPE with removal of endometrial lesion;  Surgeon: Annamaria Boots, MD;  Location: [facility];  Service: Gynecology;  Laterality: N/A;   LEAD REVISION/REPAIR N/A 10/13/2017   Procedure: LEAD REVISION/REPAIR;  Surgeon: Regan Lemming, MD;  Location: [facility];  Service: Cardiovascular;  Laterality: N/A;   MELANOMA EXCISION  1990s?   from back   PACEMAKER IMPLANT N/A 08/11/2017   Procedure: PACEMAKER IMPLANT;  Surgeon: Regan Lemming, MD;  Location: [facility];  Service: Cardiovascular;  Laterality: N/A;   RIGHT/LEFT HEART CATH AND CORONARY ANGIOGRAPHY N/A 08/08/2017   Procedure: RIGHT/LEFT HEART CATH AND CORONARY ANGIOGRAPHY;  Surgeon: Lyn Records, MD;  Location: [facility];  Service: Cardiovascular;  Laterality: N/A;     Current Outpatient Medications  Medication Sig Dispense Refill   abatacept (ORENCIA) 250 MG injection Inject 250 mg into the vein every 30 (thirty) days.      aspirin 81 MG tablet Take 81 mg by mouth daily.     atorvastatin (LIPITOR) 40 MG tablet TAKE 1 TABLET BY MOUTH EVERY DAY 90 tablet 2   cetirizine (ZYRTEC) 10 MG tablet Take 10 mg by mouth daily as needed for allergies.     escitalopram (LEXAPRO) 20 MG tablet Take 20 mg by mouth at bedtime.      fluticasone (FLONASE) 50 MCG/ACT nasal spray Place 1-2 sprays into both nostrils daily as needed for allergies or rhinitis.     latanoprost (XALATAN) 0.005 % ophthalmic solution SMARTSIG:1 Drop(s) In Eye(s)     No current facility-administered medications for this visit.    Allergies:   Methotrexate derivatives, Plaquenil [hydroxychloroquine sulfate], Remicade [infliximab], Ridaura [auranofin], and Sudafed [pseudoephedrine]    Social History:  The patient  reports that she has never smoked. She has never used smokeless tobacco. She reports that she does not drink alcohol and does not use drugs.   Family History:  The patient's family history includes CAD (age of onset: 12) in her brother; Coronary artery disease in her mother; Heart disease in her father; Hypertension in her father; Pancreatic cancer in her father.    ROS:  Please see the history of present illness.  . Otherwise, review of systems are positive for none.   All other systems are reviewed and negative.    PHYSICAL EXAM: VS:  LMP 04/15/1990  , BMI There is no height or weight on file to calculate BMI. GENERAL:  Well appearing WF in NAD HEENT:  PERRL, EOMI, sclera are clear. Oropharynx is clear. NECK:  No jugular venous distention, carotid upstroke brisk and symmetric, no bruits, no thyromegaly or adenopathy LUNGS:  Clear to auscultation bilaterally CHEST:  Unremarkable HEART:  RRR,  PMI not displaced or sustained,S1 and  S2 within normal limits, no S3, no S4: no clicks, no rubs, soft 1/6 systolic murmur at the apex ABD:  Soft, nontender. BS +, no masses or bruits. No hepatomegaly, no splenomegaly EXT:  2 + pulses throughout, no edema, no cyanosis no clubbing SKIN:  Warm and dry.  No rashes NEURO:  Alert and oriented x 3. Cranial nerves II through XII intact. PSYCH:  Cognitively intact  EKG:  EKG is  ordered today. AV sequential pacing rate 60. I have personally reviewed and interpreted this study.    Recent Labs: Lab Results  Component Value Date   WBC 8.8 09/23/2018   HGB 13.2 09/23/2018   HCT 41.1 09/23/2018   PLT 165 09/23/2018   GLUCOSE 91 08/24/2021   CHOL 147 08/24/2021   TRIG 34 08/24/2021   HDL 74 08/24/2021   LDLCALC 64  08/24/2021   ALT 17 08/24/2021   AST 29 08/24/2021   NA 140 08/24/2021   K 4.4 08/24/2021   CL 105 08/24/2021   CREATININE 0.81 08/24/2021   BUN 18 08/24/2021   CO2 25 08/24/2021   TSH 1.610 07/14/2017   INR 1.03 08/13/2017     Lipid Panel    Component Value Date/Time   CHOL 147 08/24/2021 0843   TRIG 34 08/24/2021 0843   HDL 74 08/24/2021 0843   CHOLHDL 2.0 08/24/2021 0843   LDLCALC 64 08/24/2021 0843   Dated 02/07/22: normal CBC, CMET, TSH  Wt Readings from Last 3 Encounters:  03/27/22 116 lb (52.6 kg)  11/20/21 113 lb 3.2 oz (51.3 kg)  08/24/21 113 lb 6.4 oz (51.4 kg)      Other studies Reviewed: Additional studies/ records that were reviewed today include: Event monitor 07/24/17: Study Highlights   Normal sinus rhythm Periods of intermittent complete heart block and Type 2 second degree AV block with 2:1 conduction   Echo 07/31/17: Study Conclusions   - Left ventricle: The cavity size was normal. Wall thickness was   normal. Systolic function was normal. The estimated ejection   fraction was in the range of 60% to 65%. Wall motion was normal;   there were no regional wall motion abnormalities. Doppler   parameters are consistent with  abnormal left ventricular   relaxation (grade 1 diastolic dysfunction). - Aortic valve: There was no stenosis. - Mitral valve: The findings are consistent with moderate stenosis.   There was no significant regurgitation. Mean gradient (D): 8 mm   Hg. Valve area by pressure half-time: 1.49 cm^2. - Left atrium: The atrium was moderately dilated. - Right ventricle: The cavity size was normal. Systolic function   was normal. - Tricuspid valve: Peak RV-RA gradient (S): 26 mm Hg. - Pulmonary arteries: PA peak pressure: 29 mm Hg (S). - Inferior vena cava: The vessel was normal in size. The   respirophasic diameter changes were in the normal range (>= 50%),   consistent with normal central venous pressure. - Pericardium, extracardiac: Small to moderate pericardial   effusion. < 25% respirophasic variation of mitral E inflow   velocity. IVC is not dilated. No significant RV diastolic   collapse. No pericardial tamponade.   Impressions:   - Normal LV size with EF 60-65%. Normal RV size and systolic   function. 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 There was a small to moderate pericardial   effusion without tamponade.  ETT 08/05/17: Study Highlights     Blood pressure demonstrated a hypertensive response to exercise. There was no ST segment deviation noted during stress.   Abnormal ETT with poor exercise tolerance (1:01); pt complained of chest discomfort during the study; hypertensive BP response; pt developed 2:1 AV block with exertion (that correlated with chest pain) that resolved in recovery; no ST changes. Pt reviewed with Dr Elberta Fortis prior to DC.     Cardiac cath/PCI: 08/08/17: Conclusion   80% ostial RCA.  The RCA is dominant. 30 to 40% proximal and mid LAD.  70% proximal diagonal #1. Widely patent circumflex with tortuous obtuse marginals. Normal left main Calcified mitral valve and annulus.  No significant mitral stenosis is noted. Mild  pulmonary hypertension Normal left ventricular systolic function with elevated end-diastolic and pulmonary wedge pressure in the 20 to 22 mmHg range consistent with diastolic heart failure, chronic. Successful PCI and stent of the ostial RCA from 80% with TIMI grade  III flow to less than 10% with TIMI grade III flow using a 3.0 x 12 mm Synergy postdilated to 3.5 mmHg pressure.   RECOMMENDATIONS:   Aspirin and Plavix for at least 6 months. Pacemaker may be needed if continued prolonged episodes of second-degree heart block with bradycardia. Aggressive risk factor modification. Probable discharge in a.m. if no complications.      Pacemaker implant 08/11/17: CONCLUSIONS:   1. Successful implantation of a Medtronic Azure XT DR MRI SureScan dual-chamber pacemaker for symptomatic bradycardia  2. No early apparent complications.     Echo 12/05/17: Study Conclusions   - Left ventricle: The cavity size was normal. Systolic function was   vigorous. The estimated ejection fraction was in the range of 65%   to 70%. Wall motion was normal; there were no regional wall   motion abnormalities. Doppler parameters are consistent with   abnormal left ventricular relaxation (grade 1 diastolic   dysfunction). Doppler parameters are consistent with elevated   ventricular end-diastolic filling pressure. - Mitral valve: Moderately thickened, moderately calcified leaflets   . The findings are consistent with moderate stenosis. There was   mild regurgitation. Mean gradient (D): 7 mm Hg. Valve area by   continuity equation (using LVOT flow): 1.17 cm^2. - Left atrium: The atrium was mildly dilated. - Right ventricle: Systolic function was normal. - Right atrium: The atrium was mildly dilated. - Tricuspid valve: There was mild regurgitation. - Pericardium, extracardiac: A mild to moderate circumferential   pericardial effusion was identified. Features were not consistent   with tamponade physiology.    Impressions:   - No significant change since the prior study on 07/31/2017.     ASSESSMENT AND PLAN:  1. CAD. S/p DES of ostial RCA on 08/08/17. No significant angina. Continue ASA.   Continue statin. Encouraged continued exercise program. Residual disease in a diagonal branch will be managed medically.  2. CHB s/p DDD pacemaker insertion on 08/11/17. Normal Pacer follow up 3. Mild-moderate  Mitral stenosis on Echo. Right heart cath without significant stenosis/vavle gradient. No new symptoms. Will update Echo today. Last study in 2019.  4. Diastolic dysfunction 5. RA. Follow up with  Dr. Dierdre Forth. On Orencia.  6. 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 Update Echo 7. HLD on statin. Will update lipid panel.   Current medicines are reviewed at length with the patient today.  The patient does not have concerns regarding medicines.  The following changes have been made:  no change  Labs/ tests ordered today include:   No orders of the defined types were placed in this encounter.     Disposition:   FU with me 6 months  Signed, Mckinsley Koelzer [place], MD  09/13/2022 4:28 PM    [facility] [address] [place], [place], [address] Phone [phone], Fax [phone]

## 2022-09-16 ENCOUNTER — Encounter: Payer: Self-pay | Admitting: Cardiology

## 2022-09-16 ENCOUNTER — Ambulatory Visit: Payer: Medicare Other | Attending: Cardiology | Admitting: Cardiology

## 2022-09-16 VITALS — BP 128/84 | HR 62 | Ht 62.5 in | Wt 115.0 lb

## 2022-09-16 DIAGNOSIS — I442 Atrioventricular block, complete: Secondary | ICD-10-CM | POA: Insufficient documentation

## 2022-09-16 DIAGNOSIS — I050 Rheumatic mitral stenosis: Secondary | ICD-10-CM | POA: Diagnosis not present

## 2022-09-16 DIAGNOSIS — I2510 Atherosclerotic heart disease of native coronary artery without angina pectoris: Secondary | ICD-10-CM

## 2022-09-16 DIAGNOSIS — Z950 Presence of cardiac pacemaker: Secondary | ICD-10-CM

## 2022-09-16 NOTE — Patient Instructions (Signed)
Medication Instructions:  Continue same medications *If you need a refill on your cardiac medications before your next appointment, please call your pharmacy*   Lab Work: None ordered    Testing/Procedures: None ordered   Follow-Up: At [facility], you and your health needs are our priority.  As part of our continuing mission to provide you with exceptional heart care, we have created designated Provider Care Teams.  These Care Teams include your primary Cardiologist (physician) and Advanced Practice Providers (APPs -  Physician Assistants and Nurse Practitioners) who all work together to provide you with the care you need, when you need it.  We recommend signing up for the patient portal called "MyChart".  Sign up information is provided on this After Visit Summary.  MyChart is used to connect with patients for Virtual Visits (Telemedicine).  Patients are able to view lab/test results, encounter notes, upcoming appointments, etc.  Non-urgent messages can be sent to your provider as well.   To learn more about what you can do with MyChart, go to [email or web address].    Your next appointment:  6 months    Provider:  Dr.Jordan   

## 2022-10-14 DIAGNOSIS — M0579 Rheumatoid arthritis with rheumatoid factor of multiple sites without organ or systems involvement: Secondary | ICD-10-CM | POA: Diagnosis not present

## 2022-10-15 ENCOUNTER — Ambulatory Visit: Payer: Medicare Other

## 2022-10-15 DIAGNOSIS — I442 Atrioventricular block, complete: Secondary | ICD-10-CM | POA: Diagnosis not present

## 2022-10-15 LAB — CUP PACEART REMOTE DEVICE CHECK
Battery Remaining Longevity: 63 mo
Battery Voltage: 2.96 V
Brady Statistic AP VP Percent: 35.07 %
Brady Statistic AP VS Percent: 0 %
Brady Statistic AS VP Percent: 64.92 %
Brady Statistic AS VS Percent: 0.01 %
Brady Statistic RA Percent Paced: 34.87 %
Brady Statistic RV Percent Paced: 99.99 %
Date Time Interrogation Session: 20240702002506
Implantable Lead Connection Status: 753985
Implantable Lead Connection Status: 753985
Implantable Lead Implant Date: 20190429
Implantable Lead Implant Date: 20190429
Implantable Lead Location: 753859
Implantable Lead Location: 753860
Implantable Lead Model: 5076
Implantable Lead Model: 5076
Implantable Pulse Generator Implant Date: 20190429
Lead Channel Impedance Value: 323 Ohm
Lead Channel Impedance Value: 437 Ohm
Lead Channel Impedance Value: 475 Ohm
Lead Channel Impedance Value: 513 Ohm
Lead Channel Pacing Threshold Amplitude: 0.5 V
Lead Channel Pacing Threshold Amplitude: 0.5 V
Lead Channel Pacing Threshold Pulse Width: 0.4 ms
Lead Channel Pacing Threshold Pulse Width: 0.4 ms
Lead Channel Sensing Intrinsic Amplitude: 18.5 mV
Lead Channel Sensing Intrinsic Amplitude: 18.5 mV
Lead Channel Sensing Intrinsic Amplitude: 2.625 mV
Lead Channel Sensing Intrinsic Amplitude: 2.625 mV
Lead Channel Setting Pacing Amplitude: 1.5 V
Lead Channel Setting Pacing Amplitude: 2.5 V
Lead Channel Setting Pacing Pulse Width: 0.4 ms
Lead Channel Setting Sensing Sensitivity: 1.2 mV
Zone Setting Status: 755011
Zone Setting Status: 755011

## 2022-10-28 IMAGING — DX DG FOOT COMPLETE 3+V*L*
2 series · 3 of 3 positions shown · non-contrast
Comparison: None.

CLINICAL DATA: Left foot and ankle pain.  Fall today.

EXAM:
LEFT FOOT - COMPLETE 3+ VIEW

[Series 1: foot · 0.14mm/px · 2 of 2 slices shown]
[im 1/2]
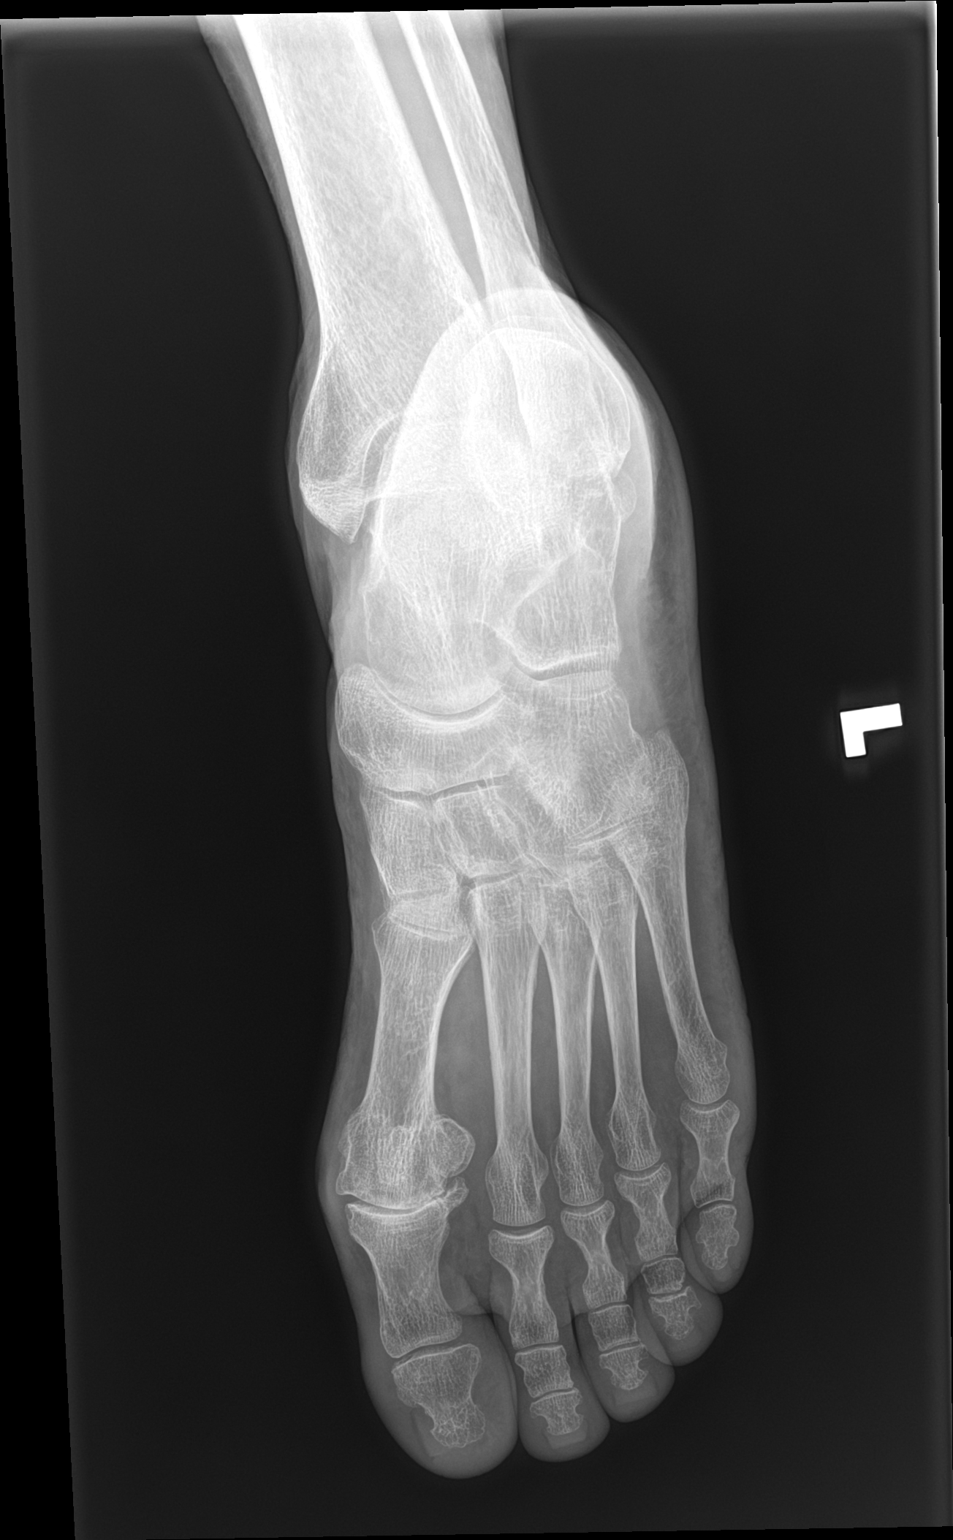
[im 2/2]
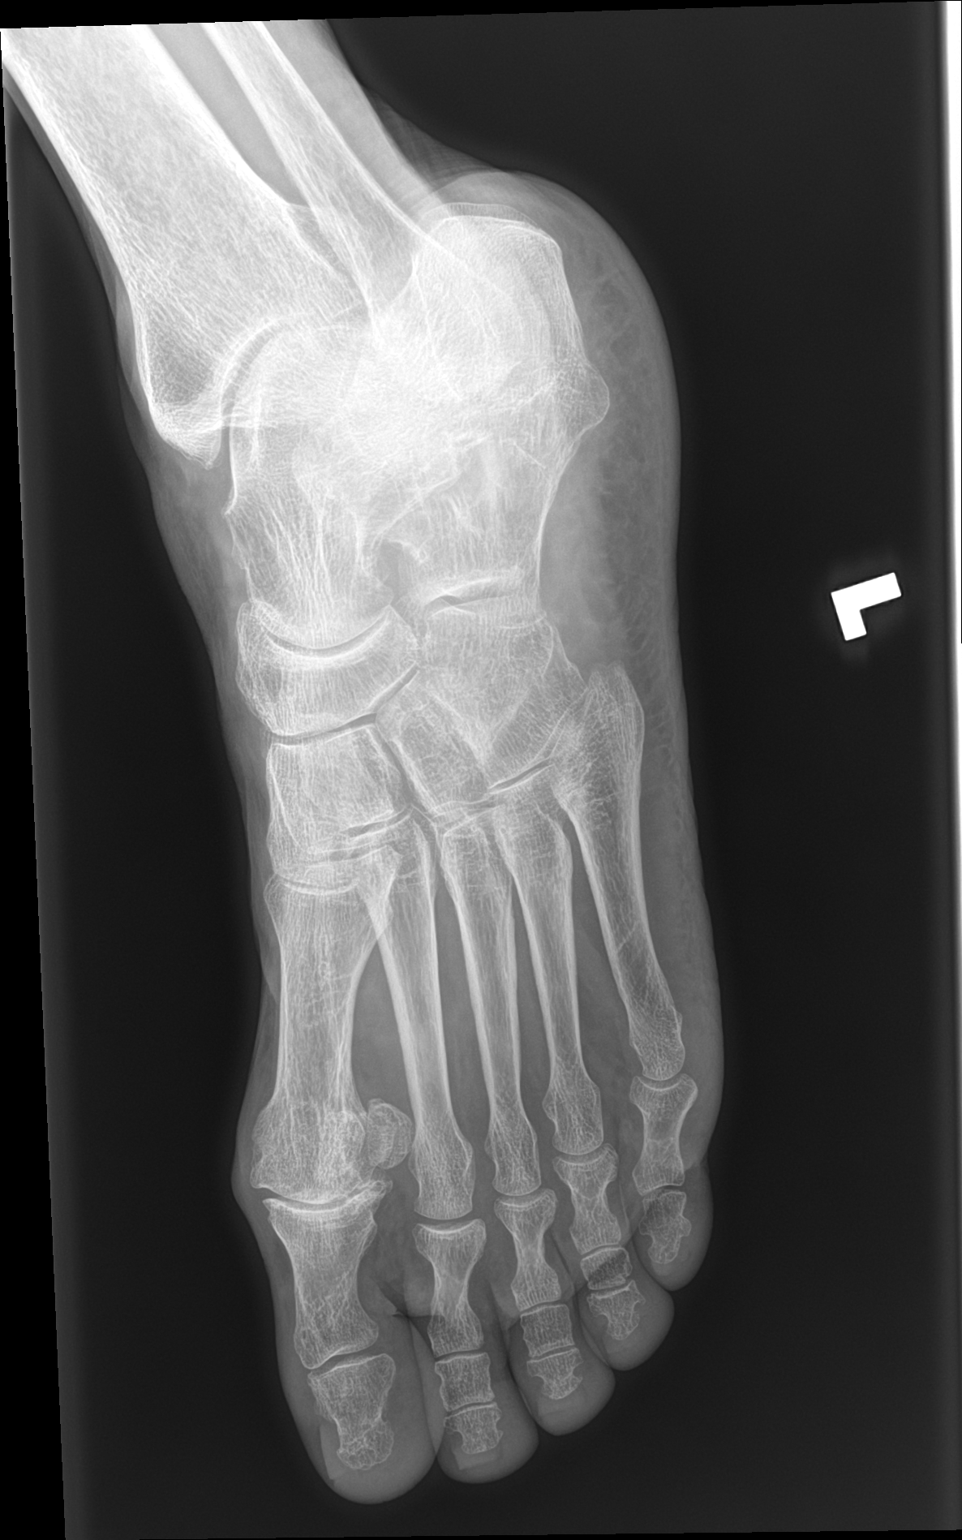

[leg]
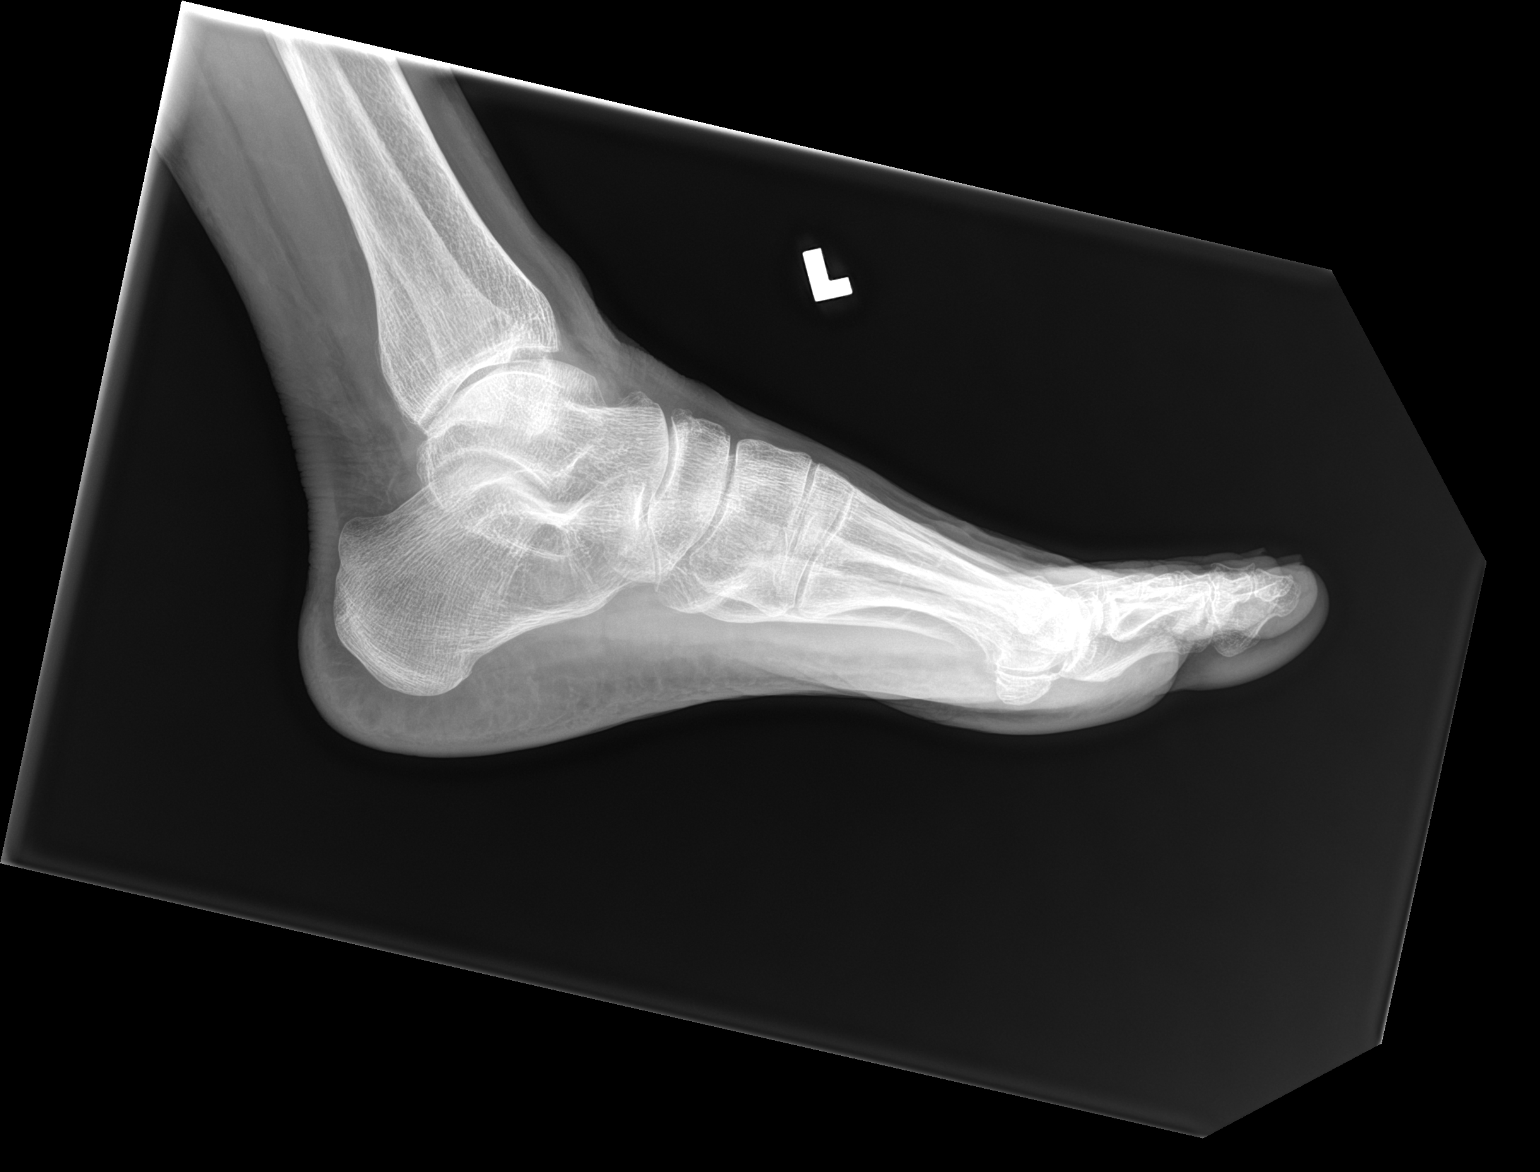

[3 of 3 positions shown; findings below may reference images not displayed]

FINDINGS: There is no evidence of fracture or dislocation. Mild osteoarthritis
of the first metatarsal phalangeal joint. Minimal talonavicular
degenerative change. Soft tissues are unremarkable.
IMPRESSION: No acute fracture or subluxation of the left foot.

## 2022-10-28 IMAGING — DX DG ANKLE COMPLETE 3+V*L*
1 series · 3 of 3 positions shown · non-contrast
Comparison: None.

CLINICAL DATA: Left foot and ankle pain. Fall today.

EXAM:
LEFT ANKLE COMPLETE - 3+ VIEW

[Series 1: ankle · 0.14mm/px · 3 of 3 slices shown]
[im 1/3]
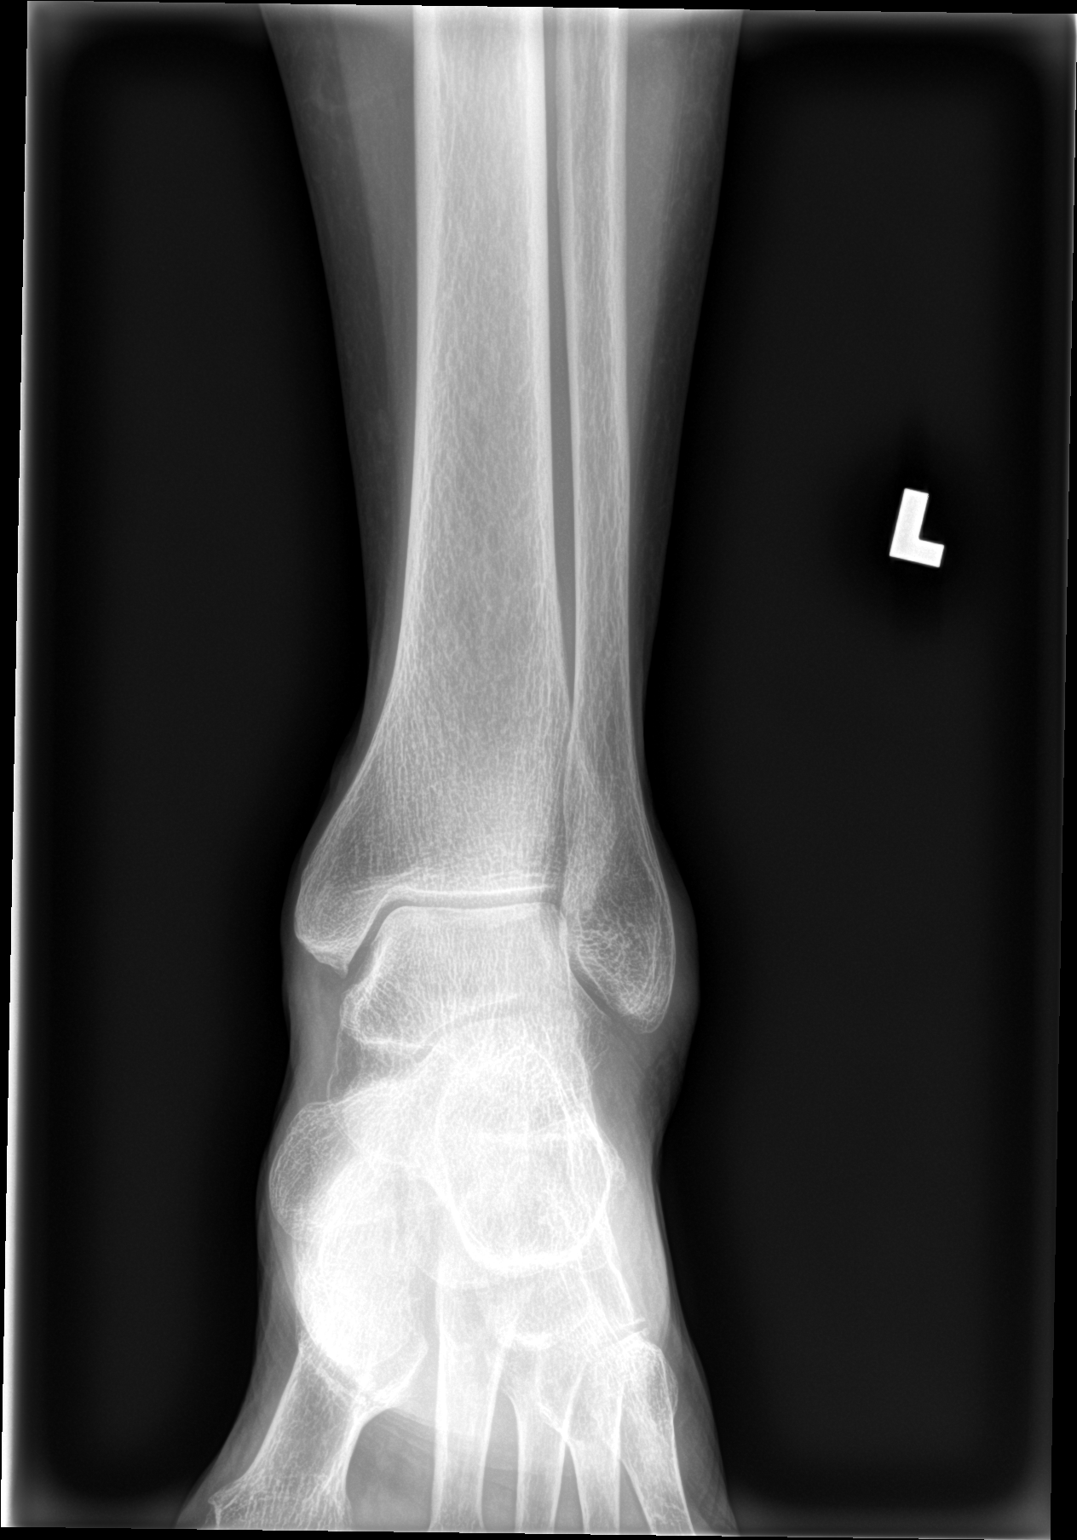
[im 2/3]
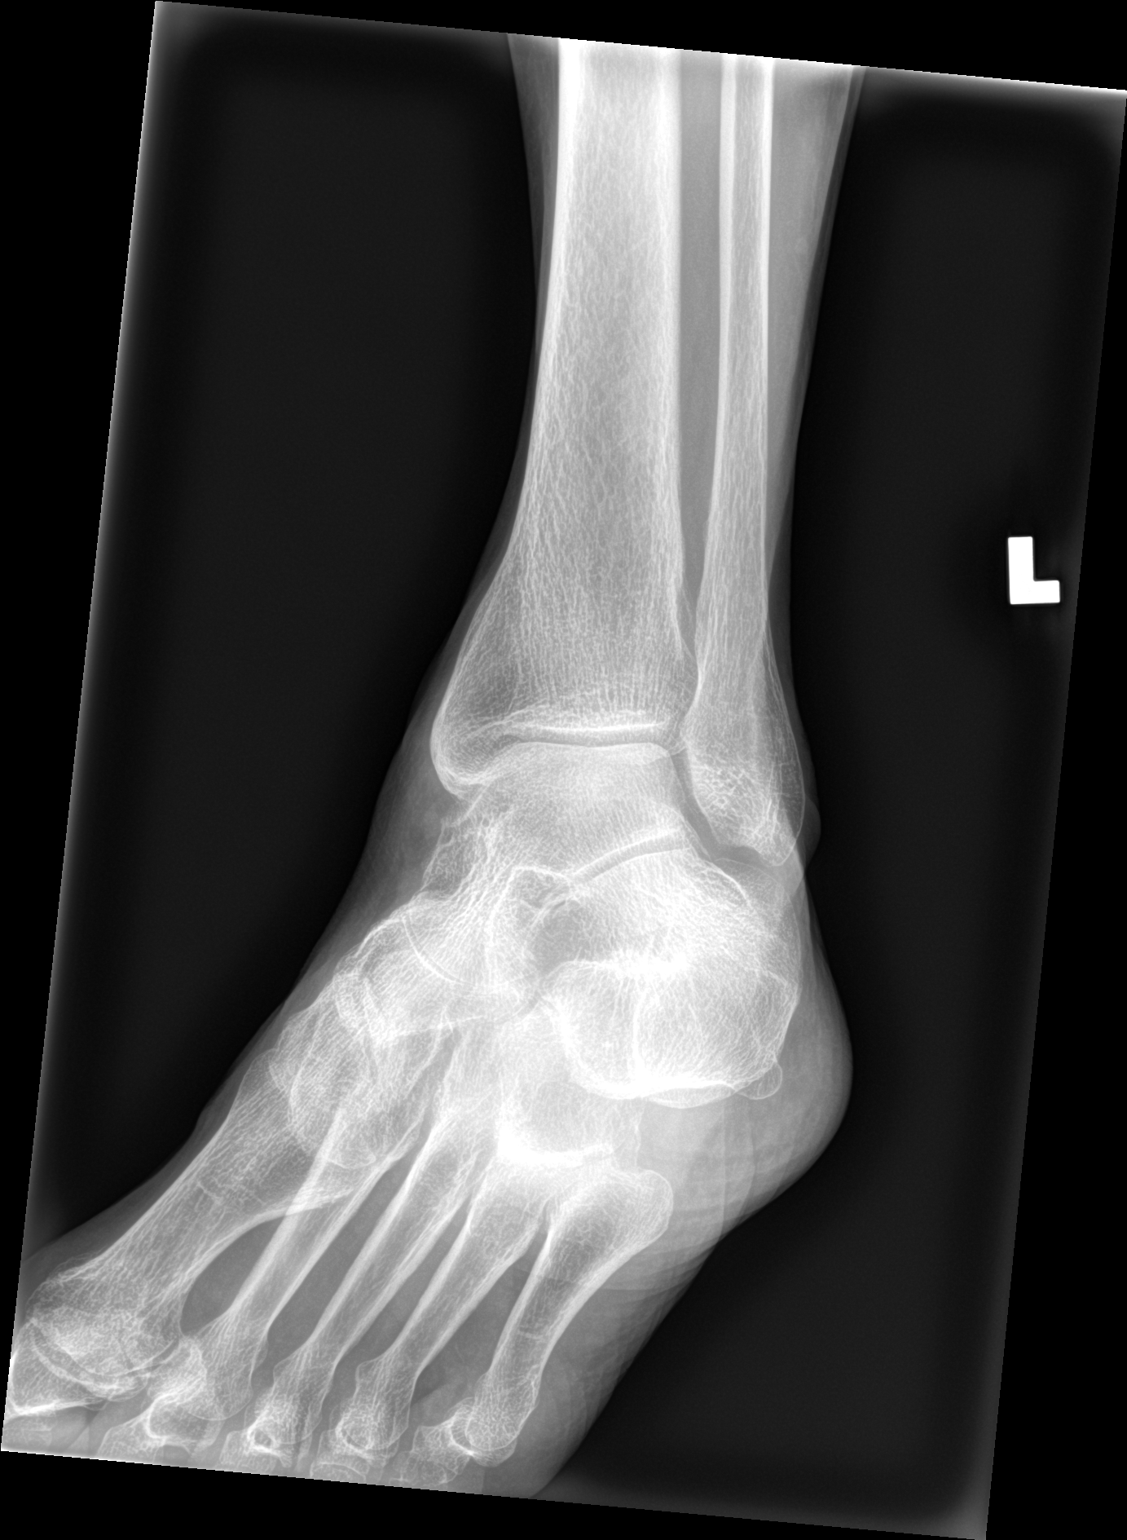
[im 3/3]
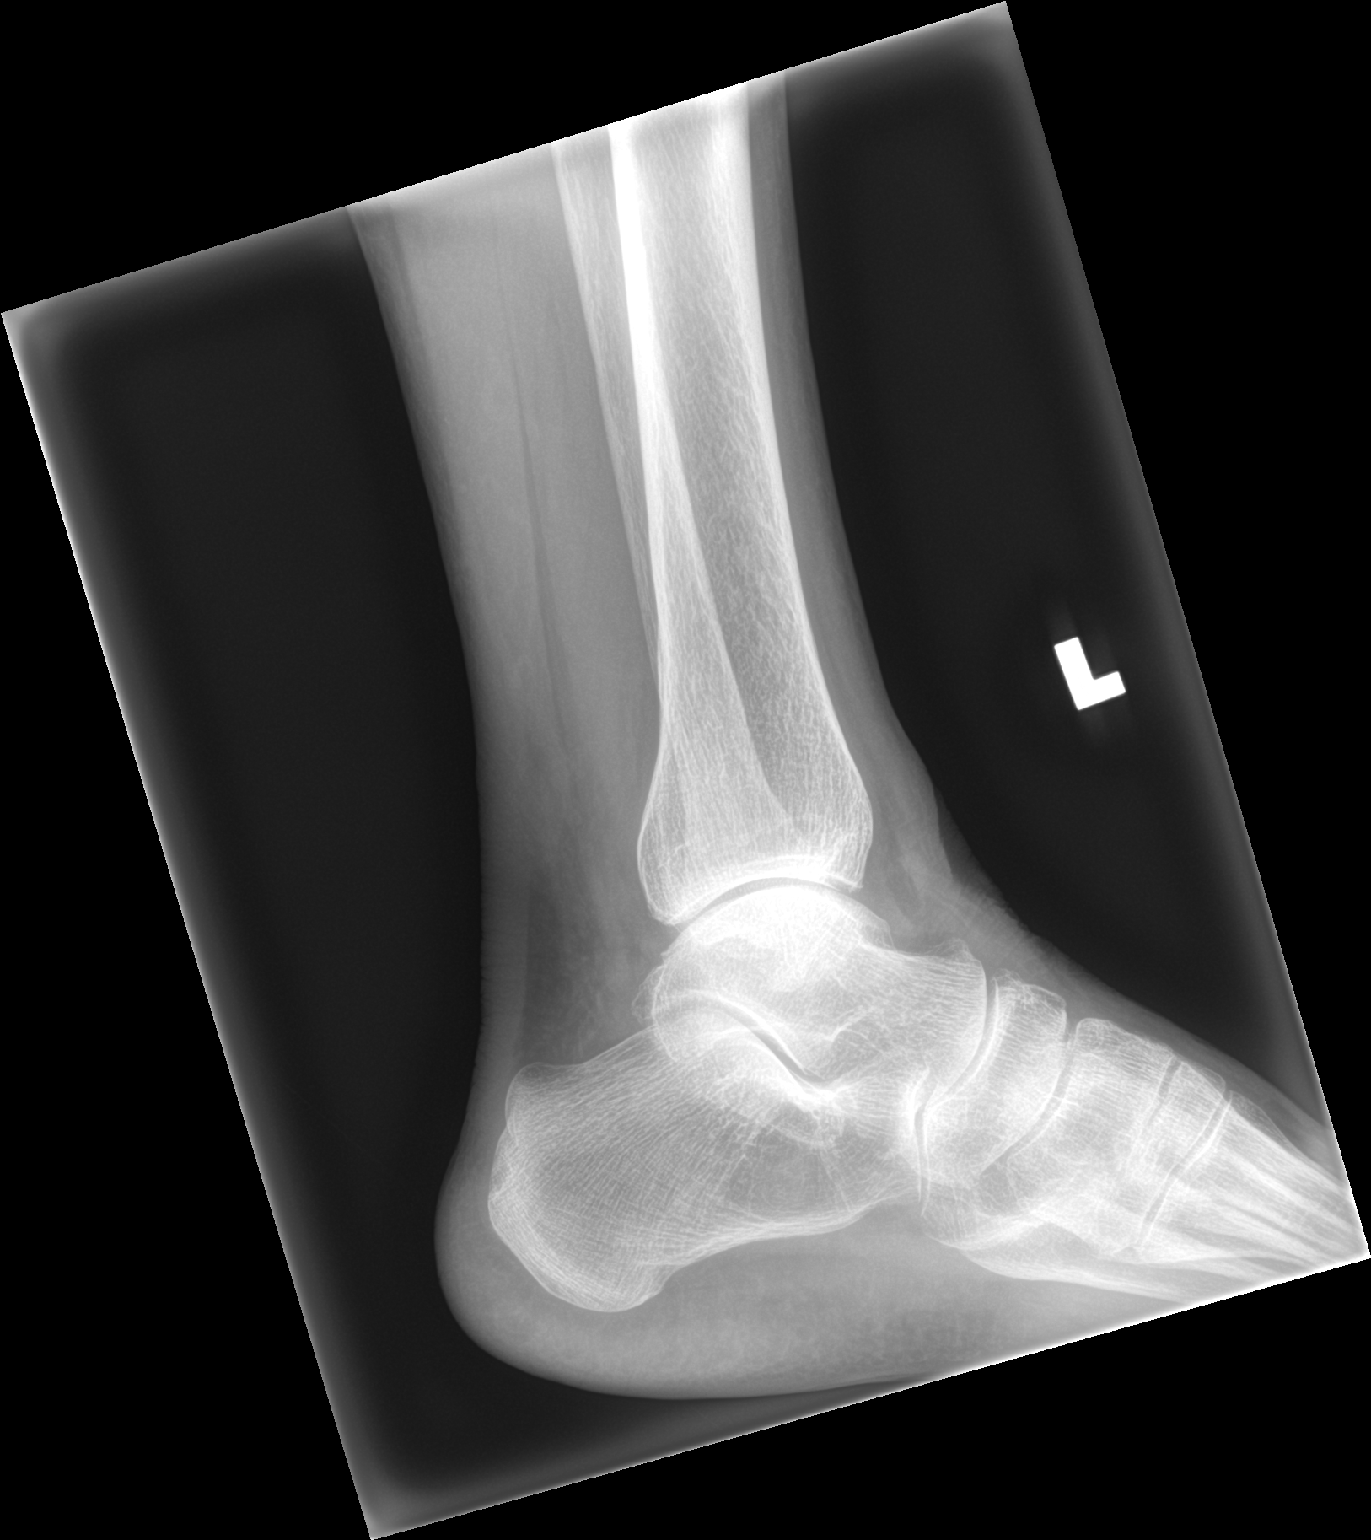

[3 of 3 positions shown; findings below may reference images not displayed]

FINDINGS: There is no evidence of fracture or dislocation. Ankle mortise is
preserved. There is a small ankle joint effusion. Soft tissues are
unremarkable.
IMPRESSION: Small ankle joint effusion. No fracture or subluxation.

## 2022-11-08 NOTE — Progress Notes (Signed)
Remote pacemaker transmission.   

## 2022-11-11 DIAGNOSIS — M0579 Rheumatoid arthritis with rheumatoid factor of multiple sites without organ or systems involvement: Secondary | ICD-10-CM | POA: Diagnosis not present

## 2022-12-08 ENCOUNTER — Other Ambulatory Visit: Payer: Self-pay | Admitting: Cardiology

## 2022-12-09 DIAGNOSIS — M0579 Rheumatoid arthritis with rheumatoid factor of multiple sites without organ or systems involvement: Secondary | ICD-10-CM | POA: Diagnosis not present

## 2023-01-06 DIAGNOSIS — M0579 Rheumatoid arthritis with rheumatoid factor of multiple sites without organ or systems involvement: Secondary | ICD-10-CM | POA: Diagnosis not present

## 2023-01-14 ENCOUNTER — Ambulatory Visit (INDEPENDENT_AMBULATORY_CARE_PROVIDER_SITE_OTHER): Payer: Medicare Other

## 2023-01-14 DIAGNOSIS — I442 Atrioventricular block, complete: Secondary | ICD-10-CM

## 2023-01-14 LAB — CUP PACEART REMOTE DEVICE CHECK
Battery Remaining Longevity: 56 mo
Battery Voltage: 2.96 V
Brady Statistic AP VP Percent: 52.83 %
Brady Statistic AP VS Percent: 0 %
Brady Statistic AS VP Percent: 47.16 %
Brady Statistic AS VS Percent: 0 %
Brady Statistic RA Percent Paced: 52.68 %
Brady Statistic RV Percent Paced: 99.99 %
Date Time Interrogation Session: 20240930204601
Implantable Lead Connection Status: 753985
Implantable Lead Connection Status: 753985
Implantable Lead Implant Date: 20190429
Implantable Lead Implant Date: 20190429
Implantable Lead Location: 753859
Implantable Lead Location: 753860
Implantable Lead Model: 5076
Implantable Lead Model: 5076
Implantable Pulse Generator Implant Date: 20190429
Lead Channel Impedance Value: 342 Ohm
Lead Channel Impedance Value: 456 Ohm
Lead Channel Impedance Value: 475 Ohm
Lead Channel Impedance Value: 513 Ohm
Lead Channel Pacing Threshold Amplitude: 0.375 V
Lead Channel Pacing Threshold Amplitude: 0.625 V
Lead Channel Pacing Threshold Pulse Width: 0.4 ms
Lead Channel Pacing Threshold Pulse Width: 0.4 ms
Lead Channel Sensing Intrinsic Amplitude: 0.75 mV
Lead Channel Sensing Intrinsic Amplitude: 0.75 mV
Lead Channel Sensing Intrinsic Amplitude: 21.125 mV
Lead Channel Sensing Intrinsic Amplitude: 21.125 mV
Lead Channel Setting Pacing Amplitude: 1.5 V
Lead Channel Setting Pacing Amplitude: 2.5 V
Lead Channel Setting Pacing Pulse Width: 0.4 ms
Lead Channel Setting Sensing Sensitivity: 1.2 mV
Zone Setting Status: 755011
Zone Setting Status: 755011

## 2023-01-17 ENCOUNTER — Ambulatory Visit: Payer: Medicare Other | Admitting: Cardiology

## 2023-01-23 DIAGNOSIS — Z1231 Encounter for screening mammogram for malignant neoplasm of breast: Secondary | ICD-10-CM | POA: Diagnosis not present

## 2023-01-23 DIAGNOSIS — M79662 Pain in left lower leg: Secondary | ICD-10-CM | POA: Diagnosis not present

## 2023-01-25 DIAGNOSIS — Z23 Encounter for immunization: Secondary | ICD-10-CM | POA: Diagnosis not present

## 2023-01-27 ENCOUNTER — Ambulatory Visit (HOSPITAL_COMMUNITY)
Admission: RE | Admit: 2023-01-27 | Discharge: 2023-01-27 | Disposition: A | Discharge status: Home / Self Care | Payer: Medicare Other | Source: Ambulatory Visit | Attending: Vascular Surgery | Admitting: Vascular Surgery

## 2023-01-27 ENCOUNTER — Other Ambulatory Visit (HOSPITAL_COMMUNITY): Payer: Self-pay | Admitting: Family Medicine

## 2023-01-27 DIAGNOSIS — R609 Edema, unspecified: Secondary | ICD-10-CM | POA: Insufficient documentation

## 2023-01-28 NOTE — Progress Notes (Signed)
Remote pacemaker transmission.   

## 2023-02-03 DIAGNOSIS — M1991 Primary osteoarthritis, unspecified site: Secondary | ICD-10-CM | POA: Diagnosis not present

## 2023-02-03 DIAGNOSIS — M549 Dorsalgia, unspecified: Secondary | ICD-10-CM | POA: Diagnosis not present

## 2023-02-03 DIAGNOSIS — Z6820 Body mass index (BMI) 20.0-20.9, adult: Secondary | ICD-10-CM | POA: Diagnosis not present

## 2023-02-03 DIAGNOSIS — M810 Age-related osteoporosis without current pathological fracture: Secondary | ICD-10-CM | POA: Diagnosis not present

## 2023-02-03 DIAGNOSIS — M5136 Other intervertebral disc degeneration, lumbar region with discogenic back pain only: Secondary | ICD-10-CM | POA: Diagnosis not present

## 2023-02-03 DIAGNOSIS — M0579 Rheumatoid arthritis with rheumatoid factor of multiple sites without organ or systems involvement: Secondary | ICD-10-CM | POA: Diagnosis not present

## 2023-02-04 ENCOUNTER — Telehealth: Payer: Self-pay

## 2023-02-04 ENCOUNTER — Other Ambulatory Visit: Payer: Self-pay

## 2023-02-04 DIAGNOSIS — M810 Age-related osteoporosis without current pathological fracture: Secondary | ICD-10-CM | POA: Insufficient documentation

## 2023-02-04 DIAGNOSIS — M0579 Rheumatoid arthritis with rheumatoid factor of multiple sites without organ or systems involvement: Secondary | ICD-10-CM | POA: Diagnosis not present

## 2023-02-04 NOTE — Telephone Encounter (Signed)
.  Auth Submission: NO AUTH NEEDED Site of care: Site of care: [facility] Payer: Medicare a/b, AARP Supp Medication & CPT/J Code(s) submitted: Prolia (Denosumab) [phone] Route of submission (phone, fax, portal): pho Phone # Fax # Auth type: Buy/Bill HB Units/visits requested: q6 months Reference number:  Approval from: 02/04/23 to 04/15/23

## 2023-02-10 ENCOUNTER — Telehealth: Payer: Self-pay | Admitting: Cardiology

## 2023-02-10 NOTE — Telephone Encounter (Signed)
Prolia can increase cholesterol but it's ok for her to take. Can recheck her cholesterol ~3 months after starting med if she wishes to see if there is a big change in her cholesterol numbers and in case any cholesterol med changes need to be made.

## 2023-02-10 NOTE — Telephone Encounter (Signed)
Pt c/o medication issue:  1. Name of Medication: Prolia   2. How are you currently taking this medication (dosage and times per day)? Not currently taking  3. Are you having a reaction (difficulty breathing--STAT)? No   4. What is your medication issue? Patient states her Doctor is wanting to start her on this injection twice a year, but she saw it can effect her cholesterol. She is wanting Dr. Elvis Coil opinion on it due to being treated for her cholesterol before by him. Please advise.

## 2023-02-10 NOTE — Telephone Encounter (Signed)
Message routed to MD/pharmacy team for med review of prolia and patient's concerns

## 2023-02-11 DIAGNOSIS — Z23 Encounter for immunization: Secondary | ICD-10-CM | POA: Diagnosis not present

## 2023-02-12 NOTE — Telephone Encounter (Signed)
Called patient left Pharm D's advice on personal voice mail.Advised to have lipid panel in 3 months.Lab order mailed.

## 2023-02-13 ENCOUNTER — Ambulatory Visit: Payer: Medicare Other

## 2023-02-14 ENCOUNTER — Telehealth: Payer: Self-pay

## 2023-02-14 ENCOUNTER — Other Ambulatory Visit: Payer: Self-pay

## 2023-02-14 NOTE — Telephone Encounter (Addendum)
Auth Submission: NO AUTH NEEDED Site of care: Site of care: [facility] Payer: Medicare A/B plus AARP supplement Medication & CPT/J Code(s) submitted: Prolia (Denosumab) [id number] Route of submission (phone, fax, portal):  Phone # Fax # Auth type: Buy/Bill PB Units/visits requested: 60mg  x 1 dose Reference number:  Approval from: 02/14/23 to 05/16/23

## 2023-02-16 NOTE — Progress Notes (Unsigned)
  Cardiology Office Note:  .   Date:  02/16/2023  ID:  Elizabeth Estrada, DOB June 17, 1940, MRN [id number] PCP: Blair Heys, MD (Inactive)  [facility] Providers Cardiologist:  Peter [place], MD Electrophysiologist:  Will Jorja Loa, MD {  History of Present Illness: .   Elizabeth Estrada is a 82 y.o. female w/PMHx of CAD  (PCI 2019), HTN, HLD, CHB w/PPM, ATach  She saw Dr. Elberta Fortis 11/20/21, no cardiac complaints, recovering from a fall/fractured knee.  Minimal ATach burden No changes made  Saw dr. [place] 09/16/22, doing well, though tires easier then before, remained active Mod MS, mod pericardial effusion (chronic w/no tamponade) on her last echo, planned for echos every other year  Today's visit is scheduled as an annual visit  ROS: ***  ***Device only *** symptoms   Device information MDT dual chamber PPM implanted 08/11/2017 RV lead revision 10/13/2017 (2/2 rising thresholds)   Studies Reviewed: Marland Kitchen    EKG not done today  DEVICE interrogation done today and reviewed by myself *** Battery and lead measurements are good ***   09/12/21: TTE 1. Left ventricular ejection fraction, by estimation, is 60 to 65%. The  left ventricle has normal function. The left ventricle has no regional  wall motion abnormalities. Left ventricular diastolic parameters are  consistent with Grade II diastolic  dysfunction (pseudonormalization). Elevated left atrial pressure.   2. Right ventricular systolic function is normal. The right ventricular  size is normal. There is mildly elevated pulmonary artery systolic  pressure. The estimated right ventricular systolic pressure is 38.5 mmHg.   3. Moderate pericardial effusion. There is no evidence of cardiac  tamponade.   4. The mitral valve is abnormal. Moderate leaflet thickening, No evidence  of mitral valve regurgitation. Moderate mitral stenosis. The mean mitral  valve gradient is 7.0 mmHg with average heart rate of 60 bpm. MVA 1.4  cm^2  by continuity equation.   5. Tricuspid valve regurgitation is moderate.   6. The aortic valve is tricuspid. Aortic valve regurgitation is mild.  Aortic valve sclerosis is present, with no evidence of aortic valve  stenosis.   7. The inferior vena cava is normal in size with greater than 50%  respiratory variability, suggesting right atrial pressure of 3 mmHg.    Risk Assessment/Calculations:    Physical Exam:   VS:  LMP 04/15/1990    Wt Readings from Last 3 Encounters:  09/16/22 115 lb (52.2 kg)  03/27/22 116 lb (52.6 kg)  11/20/21 113 lb 3.2 oz (51.3 kg)    GEN: Well nourished, well developed in no acute distress NECK: No JVD; No carotid bruits CARDIAC: ***RRR, no murmurs, rubs, gallops RESPIRATORY:  *** CTA b/l without rales, wheezing or rhonchi  ABDOMEN: Soft, non-tender, non-distended EXTREMITIES:  No edema; No deformity   PPM site: *** is stable, no thinning, fluctuation, tethering  ASSESSMENT AND PLAN: .    PPM ***  CAD ***  ATach ***  HTN ***  VHD Mod MS Pericardial effusion Chronic C/w Dr. Anola Gurney      {Are you ordering a CV Procedure (e.g. stress test, cath, DCCV, TEE, etc)?   Press F2        :[id number]}     Dispo: ***  Signed, Sheilah Pigeon, PA-C

## 2023-02-19 ENCOUNTER — Encounter: Payer: Self-pay | Admitting: Physician Assistant

## 2023-02-19 ENCOUNTER — Ambulatory Visit: Payer: Medicare Other

## 2023-02-19 ENCOUNTER — Ambulatory Visit: Payer: Medicare Other | Attending: Cardiology | Admitting: Physician Assistant

## 2023-02-19 VITALS — BP 116/70 | HR 61 | Ht 62.0 in | Wt 115.2 lb

## 2023-02-19 DIAGNOSIS — I2510 Atherosclerotic heart disease of native coronary artery without angina pectoris: Secondary | ICD-10-CM | POA: Diagnosis not present

## 2023-02-19 DIAGNOSIS — Z950 Presence of cardiac pacemaker: Secondary | ICD-10-CM

## 2023-02-19 DIAGNOSIS — I480 Paroxysmal atrial fibrillation: Secondary | ICD-10-CM

## 2023-02-19 DIAGNOSIS — I4719 Other supraventricular tachycardia: Secondary | ICD-10-CM | POA: Diagnosis not present

## 2023-02-19 LAB — CUP PACEART INCLINIC DEVICE CHECK
Battery Remaining Longevity: 54 mo
Battery Voltage: 2.96 V
Brady Statistic AP VP Percent: 39.02 %
Brady Statistic AP VS Percent: 0.04 %
Brady Statistic AS VP Percent: 60.81 %
Brady Statistic AS VS Percent: 0.13 %
Brady Statistic RA Percent Paced: 38.93 %
Brady Statistic RV Percent Paced: 99.83 %
Date Time Interrogation Session: 20241106175011
Implantable Lead Connection Status: 753985
Implantable Lead Connection Status: 753985
Implantable Lead Implant Date: 20190429
Implantable Lead Implant Date: 20190429
Implantable Lead Location: 753859
Implantable Lead Location: 753860
Implantable Lead Model: 5076
Implantable Lead Model: 5076
Implantable Pulse Generator Implant Date: 20190429
Lead Channel Impedance Value: 342 Ohm
Lead Channel Impedance Value: 437 Ohm
Lead Channel Impedance Value: 475 Ohm
Lead Channel Impedance Value: 513 Ohm
Lead Channel Pacing Threshold Amplitude: 0.375 V
Lead Channel Pacing Threshold Amplitude: 0.5 V
Lead Channel Pacing Threshold Pulse Width: 0.4 ms
Lead Channel Pacing Threshold Pulse Width: 0.4 ms
Lead Channel Sensing Intrinsic Amplitude: 2.5 mV
Lead Channel Sensing Intrinsic Amplitude: 2.875 mV
Lead Channel Sensing Intrinsic Amplitude: 20.375 mV
Lead Channel Sensing Intrinsic Amplitude: 24.875 mV
Lead Channel Setting Pacing Amplitude: 1.5 V
Lead Channel Setting Pacing Amplitude: 2.5 V
Lead Channel Setting Pacing Pulse Width: 0.4 ms
Lead Channel Setting Sensing Sensitivity: 1.2 mV
Zone Setting Status: 755011
Zone Setting Status: 755011

## 2023-02-19 NOTE — Patient Instructions (Signed)
Medication Instructions:  Your physician recommends that you continue on your current medications as directed. Please refer to the Current Medication list given to you today. *If you need a refill on your cardiac medications before your next appointment, please call your pharmacy*  Follow-Up: At [facility], you and your health needs are our priority.  As part of our continuing mission to provide you with exceptional heart care, we have created designated Provider Care Teams.  These Care Teams include your primary Cardiologist (physician) and Advanced Practice Providers (APPs -  Physician Assistants and Nurse Practitioners) who all work together to provide you with the care you need, when you need it.  We recommend signing up for the patient portal called "MyChart".  Sign up information is provided on this After Visit Summary.  MyChart is used to connect with patients for Virtual Visits (Telemedicine).  Patients are able to view lab/test results, encounter notes, upcoming appointments, etc.  Non-urgent messages can be sent to your provider as well.   To learn more about what you can do with MyChart, go to [email or web address].    Your next appointment:   1 year(s)  Provider:   Allegra Lai, MD

## 2023-02-19 NOTE — Progress Notes (Unsigned)
Cardiology Office Note   Date:  02/19/2023   ID:  Elizabeth Estrada, Elizabeth Estrada 1940-12-23, MRN [id number]  PCP:  Irven Coe, MD  Cardiologist:   Abbagayle Zaragoza [place], MD   No chief complaint on file.      History of Present Illness: DACI ARNT is a 82 y.o. female who is seen for follow up CAD and complete heart block.   She has a history of MVP/MS and family history of CAD. She has a history of murmur. Echo in 2006 mentions mild prolapse but in 2013 she had an Echo showing a small pericardial effusion otherwise normal. No prolapse seen. She also had a normal stress Echo at that time.   She was seen in January 2019 with some palpitations. Seen again in April with symptoms of exertional chest pressure. Echo showed mild to moderate mitral stenosis. ETT was done and demonstrated very poor exercise tolerance with hypertensive BP response. There was 2:1 AV block noted. An event monitor showed episodes of complete heart block. She underwent right and left heart cath. This showed an 80% ostial RCA stenosis and 70% diagonal. There was mild pulmonary HTN without significant mitral stenosis. EDP was elevated c/w diastolic dysfunction. She underwent stenting of the ostial RCA with DES. She was placed on DAPT. Post stent she had persistent episodes of CHB despite correcting ischemia and underwent DDD pacemaker placement on 08/11/17.    She was seen by Corine Shelter PA in August 2019. Had persistent SOB. Repeat Echo showed no change. 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 Also has moderate mitral stenosis.   Last Echo 09/12/21 showed normal EF with mild pulmonary HTN, moderate mitral stenosis, mod TR. Moderate pericardial effusion without tamponade.   Pacemaker check on 07/16/22 was normal.   On follow up today she is feeling well from a cardiac standpoint. She does note she gets tired more easily. No chest pain, dyspnea or LE edema. Stays active.   Past Medical History:  Diagnosis  Date   AV block    a. 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 Jamey Ripa   CARDIAC CATHETERIZATION     CARDIOVASCULAR STRESS TEST  10/19/1999, 2013   EF 97%, NO EVIDENCE OF ISCHEMIA, 2013 NORMAL   CATARACT EXTRACTION W/ INTRAOCULAR LENS  IMPLANT, BILATERAL Bilateral    COLONOSCOPY     CORONARY STENT INTERVENTION N/A 08/08/2017   Procedure: CORONARY STENT INTERVENTION;  Surgeon: Lyn Records, MD;  Location: [facility];  Service: Cardiovascular;  Laterality: N/A;   DILATATION & CURRETTAGE/HYSTEROSCOPY WITH RESECTOCOPE N/A 07/06/2013   Procedure: DILATATION & CURETTAGE/HYSTEROSCOPY WITH RESECTOCOPE with removal of endometrial lesion;  Surgeon: Annamaria Boots, MD;  Location: [facility];  Service: Gynecology;  Laterality: N/A;   LEAD REVISION/REPAIR N/A 10/13/2017   Procedure: LEAD REVISION/REPAIR;  Surgeon: Regan Lemming, MD;  Location: [facility];  Service: Cardiovascular;  Laterality: N/A;   MELANOMA EXCISION  1990s?   from back   PACEMAKER IMPLANT N/A 08/11/2017   Procedure: PACEMAKER IMPLANT;  Surgeon: Regan Lemming, MD;  Location: [facility];  Service: Cardiovascular;  Laterality: N/A;   RIGHT/LEFT HEART CATH AND CORONARY ANGIOGRAPHY N/A 08/08/2017   Procedure: RIGHT/LEFT HEART CATH AND CORONARY ANGIOGRAPHY;  Surgeon:  Lyn Records, MD;  Location: [facility];  Service: Cardiovascular;  Laterality: N/A;     Current Outpatient Medications  Medication Sig Dispense Refill   abatacept (ORENCIA) 250 MG injection Inject 250 mg into the vein every 30 (thirty) days.      aspirin 81 MG tablet Take 81 mg by mouth daily.     atorvastatin (LIPITOR) 40 MG tablet TAKE 1 TABLET BY MOUTH EVERY DAY 90 tablet 2   cetirizine (ZYRTEC) 10 MG tablet Take 10 mg by mouth daily as needed for allergies.     escitalopram (LEXAPRO) 20 MG tablet Take 20 mg by mouth at bedtime.      fluticasone (FLONASE) 50 MCG/ACT nasal spray Place 1-2 sprays into both nostrils daily as needed for allergies or rhinitis.     latanoprost (XALATAN) 0.005 % ophthalmic solution SMARTSIG:1 Drop(s) In Eye(s)     No current facility-administered medications for this visit.    Allergies:   Methotrexate derivatives, Plaquenil [hydroxychloroquine sulfate], Remicade [infliximab], Ridaura [auranofin], and Sudafed [pseudoephedrine]    Social History:  The patient  reports that she has never smoked. She has never used smokeless tobacco. She reports that she does not drink alcohol and does not use drugs.   Family History:  The patient's family history includes CAD (age of onset: 68) in her brother; Coronary artery disease in her mother; Heart disease in her father; Hypertension in her father; Pancreatic cancer in her father.    ROS:  Please see the history of present illness.  . Otherwise, review of systems are positive for none.   All other systems are reviewed and negative.    PHYSICAL EXAM: VS:  LMP 04/15/1990  , BMI There is no height or weight on file to calculate BMI. GENERAL:  Well appearing WF in NAD HEENT:  PERRL, EOMI, sclera are clear. Oropharynx is clear. NECK:  No jugular venous distention, carotid upstroke brisk and symmetric, no bruits, no thyromegaly or adenopathy LUNGS:  Clear to auscultation bilaterally CHEST:  Unremarkable HEART:   RRR,  PMI not displaced or sustained,S1 and S2 within normal limits, no S3, no S4: no clicks, no rubs, soft 1-2/6 systolic murmur at the apex ABD:  Soft, nontender. BS +, no masses or bruits. No hepatomegaly, no splenomegaly EXT:  2 + pulses throughout, no edema, no cyanosis no clubbing SKIN:  Warm and dry.  No rashes NEURO:  Alert and oriented x 3. Cranial nerves II through XII intact. PSYCH:  Cognitively intact  EKG:  EKG is  ordered today. A sensed and V paced. rate 62. I have personally reviewed and interpreted this study.    Recent Labs: Lab Results  Component Value Date   WBC 8.8 09/23/2018   HGB 13.2 09/23/2018   HCT 41.1 09/23/2018   PLT 165 09/23/2018   GLUCOSE 91 08/24/2021   CHOL 147 08/24/2021   TRIG 34 08/24/2021   HDL  74 08/24/2021   LDLCALC 64 08/24/2021   ALT 17 08/24/2021   AST 29 08/24/2021   NA 140 08/24/2021   K 4.4 08/24/2021   CL 105 08/24/2021   CREATININE 0.81 08/24/2021   BUN 18 08/24/2021   CO2 25 08/24/2021   TSH 1.610 07/14/2017   INR 1.03 08/13/2017     Lipid Panel    Component Value Date/Time   CHOL 147 08/24/2021 0843   TRIG 34 08/24/2021 0843   HDL 74 08/24/2021 0843   CHOLHDL 2.0 08/24/2021 0843   LDLCALC 64 08/24/2021 0843   Dated 02/07/22: normal CBC, CMET, TSH Dated 08/15/22: normal CBC and CMET  Wt Readings from Last 3 Encounters:  09/16/22 115 lb (52.2 kg)  03/27/22 116 lb (52.6 kg)  11/20/21 113 lb 3.2 oz (51.3 kg)      Other studies Reviewed: Additional studies/ records that were reviewed today include: Event monitor 07/24/17: Study Highlights   Normal sinus rhythm Periods of intermittent complete heart block and Type 2 second degree AV block with 2:1 conduction   Echo 07/31/17: Study Conclusions   - Left ventricle: The cavity size was normal. Wall thickness was   normal. Systolic function was normal. The estimated ejection   fraction was in the range of 60% to 65%. Wall motion was normal;   there were no regional  wall motion abnormalities. Doppler   parameters are consistent with abnormal left ventricular   relaxation (grade 1 diastolic dysfunction). - Aortic valve: There was no stenosis. - Mitral valve: The findings are consistent with moderate stenosis.   There was no significant regurgitation. Mean gradient (D): 8 mm   Hg. Valve area by pressure half-time: 1.49 cm^2. - Left atrium: The atrium was moderately dilated. - Right ventricle: The cavity size was normal. Systolic function   was normal. - Tricuspid valve: Peak RV-RA gradient (S): 26 mm Hg. - Pulmonary arteries: PA peak pressure: 29 mm Hg (S). - Inferior vena cava: The vessel was normal in size. The   respirophasic diameter changes were in the normal range (>= 50%),   consistent with normal central venous pressure. - Pericardium, extracardiac: Small to moderate pericardial   effusion. < 25% respirophasic variation of mitral E inflow   velocity. IVC is not dilated. No significant RV diastolic   collapse. No pericardial tamponade.   Impressions:   - Normal LV size with EF 60-65%. Normal RV size and systolic   function. 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 There was a small to moderate pericardial   effusion without tamponade.  ETT 08/05/17: Study Highlights     Blood pressure demonstrated a hypertensive response to exercise. There was no ST segment deviation noted during stress.   Abnormal ETT with poor exercise tolerance (1:01); pt complained of chest discomfort during the study; hypertensive BP response; pt developed 2:1 AV block with exertion (that correlated with chest pain) that resolved in recovery; no ST changes. Pt reviewed with Dr Elberta Fortis prior to DC.     Cardiac cath/PCI: 08/08/17: Conclusion   80% ostial RCA.  The RCA is dominant. 30 to 40% proximal and mid LAD.  70% proximal diagonal #1. Widely patent circumflex with tortuous obtuse marginals. Normal left main Calcified mitral  valve and annulus.  No significant mitral stenosis is noted. Mild pulmonary hypertension Normal left ventricular systolic function with elevated end-diastolic and pulmonary wedge pressure in the 20 to 22 mmHg range consistent with diastolic heart failure, chronic. Successful PCI and  stent of the ostial RCA from 80% with TIMI grade III flow to less than 10% with TIMI grade III flow using a 3.0 x 12 mm Synergy postdilated to 3.5 mmHg pressure.   RECOMMENDATIONS:   Aspirin and Plavix for at least 6 months. Pacemaker may be needed if continued prolonged episodes of second-degree heart block with bradycardia. Aggressive risk factor modification. Probable discharge in a.m. if no complications.      Pacemaker implant 08/11/17: CONCLUSIONS:   1. Successful implantation of a Medtronic Azure XT DR MRI SureScan dual-chamber pacemaker for symptomatic bradycardia  2. No early apparent complications.     Echo 12/05/17: Study Conclusions   - Left ventricle: The cavity size was normal. Systolic function was   vigorous. The estimated ejection fraction was in the range of 65%   to 70%. Wall motion was normal; there were no regional wall   motion abnormalities. Doppler parameters are consistent with   abnormal left ventricular relaxation (grade 1 diastolic   dysfunction). Doppler parameters are consistent with elevated   ventricular end-diastolic filling pressure. - Mitral valve: Moderately thickened, moderately calcified leaflets   . The findings are consistent with moderate stenosis. There was   mild regurgitation. Mean gradient (D): 7 mm Hg. Valve area by   continuity equation (using LVOT flow): 1.17 cm^2. - Left atrium: The atrium was mildly dilated. - Right ventricle: Systolic function was normal. - Right atrium: The atrium was mildly dilated. - Tricuspid valve: There was mild regurgitation. - Pericardium, extracardiac: A mild to moderate circumferential   pericardial effusion was identified.  Features were not consistent   with tamponade physiology.   Impressions:   - No significant change since the prior study on 07/31/2017.     ASSESSMENT AND PLAN:  1. CAD. S/p DES of ostial RCA on 08/08/17. No significant angina. Continue ASA.   Continue statin. Encouraged continued exercise program. 2. CHB s/p DDD pacemaker insertion on 08/11/17. Normal Pacer follow up 3. Moderate  Mitral stenosis. Will repeat Echo every other year unless there is a change in exam/symptoms. 4. Diastolic dysfunction 5. RA. Follow up with  Dr. Dierdre Forth. On Orencia.  6. 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 No evidence of tamponade.  7. HLD on statin. Last LDL 64.    Current medicines are reviewed at length with the patient today.  The patient does not have concerns regarding medicines.  The following changes have been made:  no change  Labs/ tests ordered today include:   No orders of the defined types were placed in this encounter.     Disposition:   FU with me 6 months  Signed, Danee Soller [place], MD  02/19/2023 3:49 PM    [facility] [address] [place], [place], [address] Phone [phone], Fax [phone]

## 2023-02-25 ENCOUNTER — Encounter: Payer: Self-pay | Admitting: Cardiology

## 2023-02-25 ENCOUNTER — Ambulatory Visit: Payer: Medicare Other | Attending: Cardiology | Admitting: Cardiology

## 2023-02-25 VITALS — BP 114/72 | HR 60 | Ht 62.0 in | Wt 116.4 lb

## 2023-02-25 DIAGNOSIS — I2510 Atherosclerotic heart disease of native coronary artery without angina pectoris: Secondary | ICD-10-CM | POA: Diagnosis not present

## 2023-02-25 DIAGNOSIS — I3139 Other pericardial effusion (noninflammatory): Secondary | ICD-10-CM | POA: Diagnosis not present

## 2023-02-25 DIAGNOSIS — Z950 Presence of cardiac pacemaker: Secondary | ICD-10-CM

## 2023-02-25 DIAGNOSIS — I442 Atrioventricular block, complete: Secondary | ICD-10-CM

## 2023-02-25 DIAGNOSIS — I050 Rheumatic mitral stenosis: Secondary | ICD-10-CM

## 2023-02-25 NOTE — Patient Instructions (Signed)
Medication Instructions:  Continue same medications *If you need a refill on your cardiac medications before your next appointment, please call your pharmacy*   Lab Work: Bmet,lipid and hepatic panels   Testing/Procedures: Schedule Echo in Nov 2025   Follow-Up: At [facility], you and your health needs are our priority.  As part of our continuing mission to provide you with exceptional heart care, we have created designated Provider Care Teams.  These Care Teams include your primary Cardiologist (physician) and Advanced Practice Providers (APPs -  Physician Assistants and Nurse Practitioners) who all work together to provide you with the care you need, when you need it.  We recommend signing up for the patient portal called "MyChart".  Sign up information is provided on this After Visit Summary.  MyChart is used to connect with patients for Virtual Visits (Telemedicine).  Patients are able to view lab/test results, encounter notes, upcoming appointments, etc.  Non-urgent messages can be sent to your provider as well.   To learn more about what you can do with MyChart, go to [email or web address].    Your next appointment:  1 year    Call in July to schedule Nov appointment     Provider:  Dr.Jordan

## 2023-02-28 DIAGNOSIS — I2510 Atherosclerotic heart disease of native coronary artery without angina pectoris: Secondary | ICD-10-CM | POA: Diagnosis not present

## 2023-03-01 LAB — BASIC METABOLIC PANEL
BUN/Creatinine Ratio: 19 (ref 12–28)
BUN: 15 mg/dL (ref 8–27)
CO2: 25 mmol/L (ref 20–29)
Calcium: 8.9 mg/dL (ref 8.7–10.3)
Chloride: 106 mmol/L (ref 96–106)
Creatinine, Ser: 0.77 mg/dL (ref 0.57–1.00)
Glucose: 86 mg/dL (ref 70–99)
Potassium: 4.9 mmol/L (ref 3.5–5.2)
Sodium: 142 mmol/L (ref 134–144)
eGFR: 77 mL/min/{1.73_m2} (ref >59)

## 2023-03-01 LAB — HEPATIC FUNCTION PANEL
ALT: 17 [IU]/L (ref 0–32)
AST: 32 [IU]/L (ref 0–40)
Albumin: 3.9 g/dL (ref 3.7–4.7)
Alkaline Phosphatase: 90 [IU]/L (ref 44–121)
Bilirubin Total: 1 mg/dL (ref 0.0–1.2)
Bilirubin, Direct: 0.31 mg/dL (ref 0.00–0.40)
Total Protein: 6.3 g/dL (ref 6.0–8.5)

## 2023-03-01 LAB — LIPID PANEL
Chol/HDL Ratio: 1.9 ratio (ref 0.0–4.4)
Cholesterol, Total: 161 mg/dL (ref 100–199)
HDL: 85 mg/dL (ref >39)
LDL Chol Calc (NIH): 67 mg/dL (ref 0–99)
Triglycerides: 36 mg/dL (ref 0–149)
VLDL Cholesterol Cal: 9 mg/dL (ref 5–40)

## 2023-03-04 DIAGNOSIS — M0579 Rheumatoid arthritis with rheumatoid factor of multiple sites without organ or systems involvement: Secondary | ICD-10-CM | POA: Diagnosis not present

## 2023-03-04 DIAGNOSIS — Z111 Encounter for screening for respiratory tuberculosis: Secondary | ICD-10-CM | POA: Diagnosis not present

## 2023-03-04 DIAGNOSIS — R5383 Other fatigue: Secondary | ICD-10-CM | POA: Diagnosis not present

## 2023-03-04 DIAGNOSIS — Z79899 Other long term (current) drug therapy: Secondary | ICD-10-CM | POA: Diagnosis not present

## 2023-03-05 ENCOUNTER — Encounter: Payer: Self-pay | Admitting: Cardiology

## 2023-03-05 DIAGNOSIS — M1990 Unspecified osteoarthritis, unspecified site: Secondary | ICD-10-CM | POA: Diagnosis not present

## 2023-03-05 DIAGNOSIS — Z0000 Encounter for general adult medical examination without abnormal findings: Secondary | ICD-10-CM | POA: Diagnosis not present

## 2023-03-05 DIAGNOSIS — Z950 Presence of cardiac pacemaker: Secondary | ICD-10-CM | POA: Diagnosis not present

## 2023-03-05 DIAGNOSIS — I2510 Atherosclerotic heart disease of native coronary artery without angina pectoris: Secondary | ICD-10-CM | POA: Diagnosis not present

## 2023-03-05 DIAGNOSIS — M069 Rheumatoid arthritis, unspecified: Secondary | ICD-10-CM | POA: Diagnosis not present

## 2023-03-05 DIAGNOSIS — I25119 Atherosclerotic heart disease of native coronary artery with unspecified angina pectoris: Secondary | ICD-10-CM | POA: Diagnosis not present

## 2023-03-05 DIAGNOSIS — E7800 Pure hypercholesterolemia, unspecified: Secondary | ICD-10-CM | POA: Diagnosis not present

## 2023-03-05 DIAGNOSIS — I442 Atrioventricular block, complete: Secondary | ICD-10-CM | POA: Diagnosis not present

## 2023-03-05 DIAGNOSIS — M810 Age-related osteoporosis without current pathological fracture: Secondary | ICD-10-CM | POA: Diagnosis not present

## 2023-03-05 DIAGNOSIS — F322 Major depressive disorder, single episode, severe without psychotic features: Secondary | ICD-10-CM | POA: Diagnosis not present

## 2023-03-05 DIAGNOSIS — K219 Gastro-esophageal reflux disease without esophagitis: Secondary | ICD-10-CM | POA: Diagnosis not present

## 2023-03-05 DIAGNOSIS — I10 Essential (primary) hypertension: Secondary | ICD-10-CM | POA: Diagnosis not present

## 2023-03-11 DIAGNOSIS — J988 Other specified respiratory disorders: Secondary | ICD-10-CM | POA: Diagnosis not present

## 2023-03-19 ENCOUNTER — Ambulatory Visit: Payer: Medicare Other

## 2023-03-19 VITALS — BP 149/80 | HR 60 | Temp 97.7°F | Resp 16 | Ht 62.0 in | Wt 114.0 lb

## 2023-03-19 DIAGNOSIS — M810 Age-related osteoporosis without current pathological fracture: Secondary | ICD-10-CM

## 2023-03-19 MED ORDER — DENOSUMAB 60 MG/ML ~~LOC~~ SOSY
60.0000 mg | PREFILLED_SYRINGE | Freq: Once | SUBCUTANEOUS | Status: AC
  Administered 2023-03-19: 60 mg via SUBCUTANEOUS
  Filled 2023-03-19: qty 1

## 2023-03-19 NOTE — Progress Notes (Signed)
Diagnosis: Osteoporosis  Provider:  Chilton Greathouse MD  Procedure: Injection  Prolia (Denosumab), Dose: 60 mg, Site: subcutaneous, Number of injections: 1  Post Care: Observation period completed  Discharge: Condition: Good, Destination: Home . AVS Provided  Performed by:  Rico Ala, LPN

## 2023-03-27 DIAGNOSIS — D1801 Hemangioma of skin and subcutaneous tissue: Secondary | ICD-10-CM | POA: Diagnosis not present

## 2023-03-27 DIAGNOSIS — D224 Melanocytic nevi of scalp and neck: Secondary | ICD-10-CM | POA: Diagnosis not present

## 2023-03-27 DIAGNOSIS — I788 Other diseases of capillaries: Secondary | ICD-10-CM | POA: Diagnosis not present

## 2023-03-27 DIAGNOSIS — L814 Other melanin hyperpigmentation: Secondary | ICD-10-CM | POA: Diagnosis not present

## 2023-03-27 DIAGNOSIS — L821 Other seborrheic keratosis: Secondary | ICD-10-CM | POA: Diagnosis not present

## 2023-03-27 DIAGNOSIS — L853 Xerosis cutis: Secondary | ICD-10-CM | POA: Diagnosis not present

## 2023-04-01 DIAGNOSIS — M0579 Rheumatoid arthritis with rheumatoid factor of multiple sites without organ or systems involvement: Secondary | ICD-10-CM | POA: Diagnosis not present

## 2023-04-04 DIAGNOSIS — D696 Thrombocytopenia, unspecified: Secondary | ICD-10-CM | POA: Diagnosis not present

## 2023-04-15 ENCOUNTER — Ambulatory Visit (INDEPENDENT_AMBULATORY_CARE_PROVIDER_SITE_OTHER): Payer: Medicare Other

## 2023-04-15 DIAGNOSIS — I442 Atrioventricular block, complete: Secondary | ICD-10-CM | POA: Diagnosis not present

## 2023-04-15 LAB — CUP PACEART REMOTE DEVICE CHECK
Battery Remaining Longevity: 52 mo
Battery Voltage: 2.95 V
Brady Statistic AP VP Percent: 39.73 %
Brady Statistic AP VS Percent: 0 %
Brady Statistic AS VP Percent: 60.26 %
Brady Statistic AS VS Percent: 0 %
Brady Statistic RA Percent Paced: 39.64 %
Brady Statistic RV Percent Paced: 99.99 %
Date Time Interrogation Session: 20241231072355
Implantable Lead Connection Status: 753985
Implantable Lead Connection Status: 753985
Implantable Lead Implant Date: 20190429
Implantable Lead Implant Date: 20190429
Implantable Lead Location: 753859
Implantable Lead Location: 753860
Implantable Lead Model: 5076
Implantable Lead Model: 5076
Implantable Pulse Generator Implant Date: 20190429
Lead Channel Impedance Value: 342 Ohm
Lead Channel Impedance Value: 437 Ohm
Lead Channel Impedance Value: 494 Ohm
Lead Channel Impedance Value: 551 Ohm
Lead Channel Pacing Threshold Amplitude: 0.375 V
Lead Channel Pacing Threshold Amplitude: 0.5 V
Lead Channel Pacing Threshold Pulse Width: 0.4 ms
Lead Channel Pacing Threshold Pulse Width: 0.4 ms
Lead Channel Sensing Intrinsic Amplitude: 20.375 mV
Lead Channel Sensing Intrinsic Amplitude: 24.875 mV
Lead Channel Sensing Intrinsic Amplitude: 3.125 mV
Lead Channel Sensing Intrinsic Amplitude: 3.125 mV
Lead Channel Setting Pacing Amplitude: 1.5 V
Lead Channel Setting Pacing Amplitude: 2.5 V
Lead Channel Setting Pacing Pulse Width: 0.4 ms
Lead Channel Setting Sensing Sensitivity: 1.2 mV
Zone Setting Status: 755011
Zone Setting Status: 755011

## 2023-04-29 ENCOUNTER — Telehealth: Payer: Self-pay

## 2023-04-29 DIAGNOSIS — M0579 Rheumatoid arthritis with rheumatoid factor of multiple sites without organ or systems involvement: Secondary | ICD-10-CM | POA: Diagnosis not present

## 2023-04-29 DIAGNOSIS — D696 Thrombocytopenia, unspecified: Secondary | ICD-10-CM | POA: Diagnosis not present

## 2023-04-29 NOTE — Telephone Encounter (Signed)
 Auth Submission: NO AUTH NEEDED Site of care: Site of care: [facility] Payer: Medicare A/B plus AARP supplement Medication & CPT/J Code(s) submitted: Prolia  (Denosumab ) [id number] Route of submission (phone, fax, portal):  Phone # Fax # Auth type: Buy/Bill PB Units/visits requested: 60mg  x 2 doses Reference number:  Approval from: 02/14/23 to 05/15/24

## 2023-05-06 ENCOUNTER — Telehealth: Payer: Self-pay | Admitting: Medical Oncology

## 2023-05-06 ENCOUNTER — Other Ambulatory Visit: Payer: Self-pay

## 2023-05-06 DIAGNOSIS — Z7289 Other problems related to lifestyle: Secondary | ICD-10-CM

## 2023-05-06 DIAGNOSIS — D696 Thrombocytopenia, unspecified: Secondary | ICD-10-CM

## 2023-05-06 DIAGNOSIS — Z114 Encounter for screening for human immunodeficiency virus [HIV]: Secondary | ICD-10-CM

## 2023-05-06 DIAGNOSIS — R5383 Other fatigue: Secondary | ICD-10-CM

## 2023-05-06 NOTE — Telephone Encounter (Signed)
Appt tomorrow=Pt has "mild cold symptoms" including a runny nose , no fever.  I told her to keep her appt tomorrow ,but to take a covid test tomorrow am and call us with result.

## 2023-05-07 ENCOUNTER — Other Ambulatory Visit: Payer: Self-pay

## 2023-05-07 ENCOUNTER — Inpatient Hospital Stay: Payer: Medicare Other

## 2023-05-07 ENCOUNTER — Telehealth: Payer: Self-pay | Admitting: Internal Medicine

## 2023-05-07 ENCOUNTER — Inpatient Hospital Stay: Payer: Medicare Other | Admitting: Internal Medicine

## 2023-05-07 DIAGNOSIS — R5383 Other fatigue: Secondary | ICD-10-CM

## 2023-05-07 DIAGNOSIS — Z114 Encounter for screening for human immunodeficiency virus [HIV]: Secondary | ICD-10-CM

## 2023-05-07 DIAGNOSIS — Z7289 Other problems related to lifestyle: Secondary | ICD-10-CM

## 2023-05-07 DIAGNOSIS — D696 Thrombocytopenia, unspecified: Secondary | ICD-10-CM

## 2023-05-07 NOTE — Telephone Encounter (Signed)
Patient is aware of rescheduled appointment times/dates 

## 2023-05-08 DIAGNOSIS — R051 Acute cough: Secondary | ICD-10-CM | POA: Diagnosis not present

## 2023-05-08 DIAGNOSIS — Z03818 Encounter for observation for suspected exposure to other biological agents ruled out: Secondary | ICD-10-CM | POA: Diagnosis not present

## 2023-05-08 DIAGNOSIS — R002 Palpitations: Secondary | ICD-10-CM | POA: Diagnosis not present

## 2023-05-08 DIAGNOSIS — R9431 Abnormal electrocardiogram [ECG] [EKG]: Secondary | ICD-10-CM | POA: Diagnosis not present

## 2023-05-08 DIAGNOSIS — J069 Acute upper respiratory infection, unspecified: Secondary | ICD-10-CM | POA: Diagnosis not present

## 2023-05-16 ENCOUNTER — Ambulatory Visit: Payer: Medicare Other | Attending: Cardiology | Admitting: Cardiology

## 2023-05-16 ENCOUNTER — Encounter: Payer: Self-pay | Admitting: Cardiology

## 2023-05-16 VITALS — BP 134/56 | HR 73 | Ht 62.0 in | Wt 113.4 lb

## 2023-05-16 DIAGNOSIS — I442 Atrioventricular block, complete: Secondary | ICD-10-CM | POA: Diagnosis not present

## 2023-05-16 DIAGNOSIS — Z950 Presence of cardiac pacemaker: Secondary | ICD-10-CM | POA: Diagnosis not present

## 2023-05-16 NOTE — Progress Notes (Signed)
Cardiology Office Note   Date:  05/16/2023   ID:  Seira, Cody 12-01-1940, MRN [id number]  PCP:  Irven Coe, MD  Cardiologist:   Said Rueb [place], MD   Chief Complaint  Patient presents with   Abnormal ECG       History of Present Illness: Elizabeth Estrada is a 83 y.o. female who is seen for evaluation of abnormal Ecg.   She has a history of MVP/MS and family history of CAD. She has a history of murmur. Echo in 2006 mentions mild prolapse but in 2013 she had an Echo showing a small pericardial effusion otherwise normal. No prolapse seen. She also had a normal stress Echo at that time.   She was seen in January 2019 with some palpitations. Seen again in April with symptoms of exertional chest pressure. Echo showed mild to moderate mitral stenosis. ETT was done and demonstrated very poor exercise tolerance with hypertensive BP response. There was 2:1 AV block noted. An event monitor showed episodes of complete heart block. She underwent right and left heart cath. This showed an 80% ostial RCA stenosis and 70% diagonal. There was mild pulmonary HTN without significant mitral stenosis. EDP was elevated c/w diastolic dysfunction. She underwent stenting of the ostial RCA with DES. She was placed on DAPT. Post stent she had persistent episodes of CHB despite correcting ischemia and underwent DDD pacemaker placement on 08/11/17.    Last Echo 09/12/21 showed normal EF with mild pulmonary HTN, moderate mitral stenosis, mod TR. Moderate pericardial effusion without tamponade.   Pacemaker check on 04/15/23 was normal.   She was seen recently by PCP. She had symptoms of URI and hadn't been eating well. Noted some palpitations. This has all resolved. Ecg was done and is reviewed.    Past Medical History:  Diagnosis Date   AV block    a. 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 Jamey Ripa   CARDIAC CATHETERIZATION     CARDIOVASCULAR STRESS TEST  10/19/1999, 2013   EF 97%, NO EVIDENCE OF ISCHEMIA, 2013 NORMAL   CATARACT EXTRACTION W/ INTRAOCULAR LENS  IMPLANT, BILATERAL Bilateral    COLONOSCOPY     CORONARY STENT INTERVENTION N/A 08/08/2017   Procedure: CORONARY STENT INTERVENTION;  Surgeon: Lyn Records, MD;  Location: [facility];  Service: Cardiovascular;  Laterality: N/A;   DILATATION & CURRETTAGE/HYSTEROSCOPY WITH RESECTOCOPE N/A 07/06/2013   Procedure: DILATATION & CURETTAGE/HYSTEROSCOPY WITH RESECTOCOPE with removal of endometrial lesion;  Surgeon: Annamaria Boots, MD;  Location: [facility];  Service: Gynecology;  Laterality: N/A;   LEAD REVISION/REPAIR N/A 10/13/2017   Procedure: LEAD REVISION/REPAIR;  Surgeon: Regan Lemming, MD;  Location: [facility]  [facility];  Service: Cardiovascular;  Laterality: N/A;   MELANOMA EXCISION  1990s?   from back   PACEMAKER IMPLANT N/A 08/11/2017   Procedure: PACEMAKER IMPLANT;  Surgeon: Regan Lemming, MD;  Location: [facility];  Service: Cardiovascular;  Laterality: N/A;   RIGHT/LEFT HEART CATH AND CORONARY ANGIOGRAPHY N/A 08/08/2017   Procedure: RIGHT/LEFT HEART CATH AND CORONARY ANGIOGRAPHY;  Surgeon: Lyn Records, MD;  Location: [facility];  Service: Cardiovascular;  Laterality: N/A;     Current Outpatient Medications  Medication Sig Dispense Refill   abatacept  (ORENCIA) 250 MG injection Inject 250 mg into the vein every 30 (thirty) days.      aspirin 81 MG tablet Take 81 mg by mouth daily.     atorvastatin (LIPITOR) 40 MG tablet TAKE 1 TABLET BY MOUTH EVERY DAY 90 tablet 2   cetirizine (ZYRTEC) 10 MG tablet Take 10 mg by mouth daily as needed for allergies.     denosumab (PROLIA) 60 MG/ML SOSY injection Inject 60 mg into the skin every 6 (six) months.     escitalopram (LEXAPRO) 20 MG tablet Take 20 mg by mouth at bedtime.      fluticasone (FLONASE) 50 MCG/ACT nasal spray Place 1-2 sprays into both nostrils daily as needed for allergies or rhinitis.     latanoprost (XALATAN) 0.005 % ophthalmic solution SMARTSIG:1 Drop(s) In Eye(s)     No current facility-administered medications for this visit.    Allergies:   Methotrexate derivatives, Plaquenil [hydroxychloroquine sulfate], Remicade [infliximab], Ridaura [auranofin], and Sudafed [pseudoephedrine]    Social History:  The patient  reports that she has never smoked. She has never used smokeless tobacco. She reports that she does not drink alcohol and does not use drugs.   Family History:  The patient's family history includes CAD (age of onset: 76) in her brother; Coronary artery disease in her mother; Heart disease in her father; Hypertension in her father; Pancreatic cancer in her father.    ROS:  Please see the history of present illness.  . Otherwise, review of systems are positive for none.   All other systems are reviewed and negative.    PHYSICAL EXAM: VS:  BP (!) 134/56 (BP Location: Right Arm, Patient Position: Sitting, Cuff Size: Normal)   Pulse 73   Ht 5\' 2"  (1.575 m)   Wt 113 lb 6.4 oz (51.4 kg)   LMP 04/15/1990   SpO2 98%   BMI 20.74 kg/m  , BMI Body mass index is 20.74 kg/m. GENERAL:  Well appearing WF in NAD HEENT:  PERRL, EOMI, sclera are clear. Oropharynx is clear. NECK:  No jugular venous distention, carotid upstroke brisk and symmetric, no bruits, no thyromegaly or  adenopathy LUNGS:  Clear to auscultation bilaterally CHEST:  Unremarkable HEART:  RRR,  PMI not displaced or sustained,S1 and S2 within normal limits, no S3, no S4: no clicks, no rubs, soft 1-2/6 systolic murmur at the apex ABD:  Soft, nontender. BS +, no masses or bruits. No hepatomegaly, no splenomegaly EXT:  2 + pulses throughout, no edema, no cyanosis no clubbing SKIN:  Warm and dry.  No rashes NEURO:  Alert and oriented x 3. Cranial nerves II through XII intact. PSYCH:  Cognitively intact  EKG:  dated 05/08/23 shows A sensing and V pacing rate 67. This is unchanged from prior here. I have personally reviewed and interpreted this study.     Recent Labs: Lab Results  Component Value Date   WBC 8.8 09/23/2018  HGB 13.2 09/23/2018   HCT 41.1 09/23/2018   PLT 165 09/23/2018   GLUCOSE 86 02/28/2023   CHOL 161 02/28/2023   TRIG 36 02/28/2023   HDL 85 02/28/2023   LDLCALC 67 02/28/2023   ALT 17 02/28/2023   AST 32 02/28/2023   NA 142 02/28/2023   K 4.9 02/28/2023   CL 106 02/28/2023   CREATININE 0.77 02/28/2023   BUN 15 02/28/2023   CO2 25 02/28/2023   TSH 1.610 07/14/2017   INR 1.03 08/13/2017     Lipid Panel    Component Value Date/Time   CHOL 161 02/28/2023 1238   TRIG 36 02/28/2023 1238   HDL 85 02/28/2023 1238   CHOLHDL 1.9 02/28/2023 1238   LDLCALC 67 02/28/2023 1238   Dated 02/07/22: normal CBC, CMET, TSH Dated 08/15/22: normal CBC and CMET  Wt Readings from Last 3 Encounters:  05/16/23 113 lb 6.4 oz (51.4 kg)  03/19/23 114 lb (51.7 kg)  02/25/23 116 lb 6.4 oz (52.8 kg)      Other studies Reviewed: Additional studies/ records that were reviewed today include: Event monitor 07/24/17: Study Highlights   Normal sinus rhythm Periods of intermittent complete heart block and Type 2 second degree AV block with 2:1 conduction   Echo 07/31/17: Study Conclusions   - Left ventricle: The cavity size was normal. Wall thickness was   normal. Systolic function  was normal. The estimated ejection   fraction was in the range of 60% to 65%. Wall motion was normal;   there were no regional wall motion abnormalities. Doppler   parameters are consistent with abnormal left ventricular   relaxation (grade 1 diastolic dysfunction). - Aortic valve: There was no stenosis. - Mitral valve: The findings are consistent with moderate stenosis.   There was no significant regurgitation. Mean gradient (D): 8 mm   Hg. Valve area by pressure half-time: 1.49 cm^2. - Left atrium: The atrium was moderately dilated. - Right ventricle: The cavity size was normal. Systolic function   was normal. - Tricuspid valve: Peak RV-RA gradient (S): 26 mm Hg. - Pulmonary arteries: PA peak pressure: 29 mm Hg (S). - Inferior vena cava: The vessel was normal in size. The   respirophasic diameter changes were in the normal range (>= 50%),   consistent with normal central venous pressure. - Pericardium, extracardiac: Small to moderate pericardial   effusion. < 25% respirophasic variation of mitral E inflow   velocity. IVC is not dilated. No significant RV diastolic   collapse. No pericardial tamponade.   Impressions:   - Normal LV size with EF 60-65%. Normal RV size and systolic   function. 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 There was a small to moderate pericardial   effusion without tamponade.  ETT 08/05/17: Study Highlights     Blood pressure demonstrated a hypertensive response to exercise. There was no ST segment deviation noted during stress.   Abnormal ETT with poor exercise tolerance (1:01); pt complained of chest discomfort during the study; hypertensive BP response; pt developed 2:1 AV block with exertion (that correlated with chest pain) that resolved in recovery; no ST changes. Pt reviewed with Dr Elberta Fortis prior to DC.     Cardiac cath/PCI: 08/08/17: Conclusion   80% ostial RCA.  The RCA is dominant. 30 to 40% proximal and  mid LAD.  70% proximal diagonal #1. Widely patent circumflex with tortuous obtuse marginals. Normal left main Calcified mitral valve and annulus.  No significant mitral stenosis  is noted. Mild pulmonary hypertension Normal left ventricular systolic function with elevated end-diastolic and pulmonary wedge pressure in the 20 to 22 mmHg range consistent with diastolic heart failure, chronic. Successful PCI and stent of the ostial RCA from 80% with TIMI grade III flow to less than 10% with TIMI grade III flow using a 3.0 x 12 mm Synergy postdilated to 3.5 mmHg pressure.   RECOMMENDATIONS:   Aspirin and Plavix for at least 6 months. Pacemaker may be needed if continued prolonged episodes of second-degree heart block with bradycardia. Aggressive risk factor modification. Probable discharge in a.m. if no complications.      Pacemaker implant 08/11/17: CONCLUSIONS:   1. Successful implantation of a Medtronic Azure XT DR MRI SureScan dual-chamber pacemaker for symptomatic bradycardia  2. No early apparent complications.     Echo 12/05/17: Study Conclusions   - Left ventricle: The cavity size was normal. Systolic function was   vigorous. The estimated ejection fraction was in the range of 65%   to 70%. Wall motion was normal; there were no regional wall   motion abnormalities. Doppler parameters are consistent with   abnormal left ventricular relaxation (grade 1 diastolic   dysfunction). Doppler parameters are consistent with elevated   ventricular end-diastolic filling pressure. - Mitral valve: Moderately thickened, moderately calcified leaflets   . The findings are consistent with moderate stenosis. There was   mild regurgitation. Mean gradient (D): 7 mm Hg. Valve area by   continuity equation (using LVOT flow): 1.17 cm^2. - Left atrium: The atrium was mildly dilated. - Right ventricle: Systolic function was normal. - Right atrium: The atrium was mildly dilated. - Tricuspid valve: There  was mild regurgitation. - Pericardium, extracardiac: A mild to moderate circumferential   pericardial effusion was identified. Features were not consistent   with tamponade physiology.   Impressions:   - No significant change since the prior study on 07/31/2017.     ASSESSMENT AND PLAN:  1. CAD. S/p DES of ostial RCA on 08/08/17. No significant angina. 2. CHB s/p DDD pacemaker insertion on 08/11/17. Normal Pacer follow up. Ecg shows appropriate pacing. No further concern. 3. Moderate  Mitral stenosis. Will repeat Echo every other year unless there is a change in exam/symptoms. Plan Echo later this year 4. Diastolic dysfunction 5. RA. Follow up with  Dr. Dierdre Forth. On Orencia.  6. 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 No evidence of tamponade.  7. HLD on statin. Last LDL 64. Will update fasting lab. 8. Minimal AFib noted on pacer check. Will monitor. If increases in frequency consider anticoagulation   Current medicines are reviewed at length with the patient today.  The patient does not have concerns regarding medicines.  The following changes have been made:  no change  Labs/ tests ordered today include:   No orders of the defined types were placed in this encounter.     Disposition:   FU with me in the fall with Echo  Signed, Tevis Conger [place], MD  05/16/2023 12:04 PM    [facility] [address] [place], [place], [address] Phone [phone], Fax [phone]

## 2023-05-16 NOTE — Patient Instructions (Addendum)
Medication Instructions:  Continue same medications *If you need a refill on your cardiac medications before your next appointment, please call your pharmacy*   Lab Work: None ordered   Testing/Procedures: Keep Echo appointment as planned  Mon 11/3 at 1:00 pm   Follow-Up: At [facility], you and your health needs are our priority.  As part of our continuing mission to provide you with exceptional heart care, we have created designated Provider Care Teams.  These Care Teams include your primary Cardiologist (physician) and Advanced Practice Providers (APPs -  Physician Assistants and Nurse Practitioners) who all work together to provide you with the care you need, when you need it.  We recommend signing up for the patient portal called "MyChart".  Sign up information is provided on this After Visit Summary.  MyChart is used to connect with patients for Virtual Visits (Telemedicine).  Patients are able to view lab/test results, encounter notes, upcoming appointments, etc.  Non-urgent messages can be sent to your provider as well.   To learn more about what you can do with MyChart, go to [email or web address].    Your next appointment:  Call in August to schedule Nov appointment after Echo    Provider:  Dr.Jordan

## 2023-05-27 DIAGNOSIS — M0579 Rheumatoid arthritis with rheumatoid factor of multiple sites without organ or systems involvement: Secondary | ICD-10-CM | POA: Diagnosis not present

## 2023-05-27 NOTE — Progress Notes (Signed)
Remote pacemaker transmission.

## 2023-05-30 ENCOUNTER — Telehealth: Payer: Self-pay | Admitting: Internal Medicine

## 2023-05-30 ENCOUNTER — Ambulatory Visit: Payer: Medicare Other | Admitting: General Practice

## 2023-05-30 NOTE — Telephone Encounter (Signed)
Rescheduled appointments per incoming call by the patient. Patient is aware of the changes made to her upcoming appointment.

## 2023-06-03 ENCOUNTER — Encounter: Payer: Self-pay | Admitting: Internal Medicine

## 2023-06-04 ENCOUNTER — Encounter: Payer: Medicare Other | Admitting: Internal Medicine

## 2023-06-04 ENCOUNTER — Other Ambulatory Visit: Payer: Medicare Other

## 2023-06-09 ENCOUNTER — Inpatient Hospital Stay: Payer: Medicare Other

## 2023-06-09 ENCOUNTER — Inpatient Hospital Stay: Payer: Medicare Other | Attending: Internal Medicine | Admitting: Internal Medicine

## 2023-06-09 VITALS — BP 151/64 | HR 64 | Temp 97.8°F | Resp 15 | Ht 62.0 in | Wt 116.0 lb

## 2023-06-09 DIAGNOSIS — Z85820 Personal history of malignant melanoma of skin: Secondary | ICD-10-CM | POA: Insufficient documentation

## 2023-06-09 DIAGNOSIS — Z79899 Other long term (current) drug therapy: Secondary | ICD-10-CM | POA: Insufficient documentation

## 2023-06-09 DIAGNOSIS — M069 Rheumatoid arthritis, unspecified: Secondary | ICD-10-CM | POA: Diagnosis not present

## 2023-06-09 DIAGNOSIS — R197 Diarrhea, unspecified: Secondary | ICD-10-CM | POA: Insufficient documentation

## 2023-06-09 DIAGNOSIS — Z7969 Long term (current) use of other immunomodulators and immunosuppressants: Secondary | ICD-10-CM | POA: Diagnosis not present

## 2023-06-09 DIAGNOSIS — F32A Depression, unspecified: Secondary | ICD-10-CM | POA: Diagnosis not present

## 2023-06-09 DIAGNOSIS — D696 Thrombocytopenia, unspecified: Secondary | ICD-10-CM

## 2023-06-09 DIAGNOSIS — R1084 Generalized abdominal pain: Secondary | ICD-10-CM | POA: Insufficient documentation

## 2023-06-09 DIAGNOSIS — R5383 Other fatigue: Secondary | ICD-10-CM

## 2023-06-09 DIAGNOSIS — E785 Hyperlipidemia, unspecified: Secondary | ICD-10-CM | POA: Diagnosis not present

## 2023-06-09 DIAGNOSIS — Z7982 Long term (current) use of aspirin: Secondary | ICD-10-CM | POA: Diagnosis not present

## 2023-06-09 DIAGNOSIS — Z114 Encounter for screening for human immunodeficiency virus [HIV]: Secondary | ICD-10-CM

## 2023-06-09 DIAGNOSIS — Z7289 Other problems related to lifestyle: Secondary | ICD-10-CM

## 2023-06-09 LAB — CMP (CANCER CENTER ONLY)
ALT: 14 U/L (ref 0–44)
AST: 27 U/L (ref 15–41)
Albumin: 3.9 g/dL (ref 3.5–5.0)
Alkaline Phosphatase: 62 U/L (ref 38–126)
Anion gap: 4 — ABNORMAL LOW (ref 5–15)
BUN: 19 mg/dL (ref 8–23)
CO2: 28 mmol/L (ref 22–32)
Calcium: 9 mg/dL (ref 8.9–10.3)
Chloride: 108 mmol/L (ref 98–111)
Creatinine: 0.84 mg/dL (ref 0.44–1.00)
GFR, Estimated: >60 mL/min (ref >60)
Glucose, Bld: 94 mg/dL (ref 70–99)
Potassium: 4.1 mmol/L (ref 3.5–5.1)
Sodium: 140 mmol/L (ref 135–145)
Total Bilirubin: 1.2 mg/dL (ref 0.0–1.2)
Total Protein: 6.9 g/dL (ref 6.5–8.1)

## 2023-06-09 LAB — CBC WITH DIFFERENTIAL (CANCER CENTER ONLY)
Abs Immature Granulocytes: 0.02 10*3/uL (ref 0.00–0.07)
Basophils Absolute: 0 10*3/uL (ref 0.0–0.1)
Basophils Relative: 0 %
Eosinophils Absolute: 0.3 10*3/uL (ref 0.0–0.5)
Eosinophils Relative: 6 %
HCT: 42.6 % (ref 36.0–46.0)
Hemoglobin: 13.4 g/dL (ref 12.0–15.0)
Immature Granulocytes: 0 %
Lymphocytes Relative: 28 %
Lymphs Abs: 1.5 10*3/uL (ref 0.7–4.0)
MCH: 27.6 pg (ref 26.0–34.0)
MCHC: 31.5 g/dL (ref 30.0–36.0)
MCV: 87.8 fL (ref 80.0–100.0)
Monocytes Absolute: 0.4 10*3/uL (ref 0.1–1.0)
Monocytes Relative: 7 %
Neutro Abs: 3.1 10*3/uL (ref 1.7–7.7)
Neutrophils Relative %: 59 %
Platelet Count: 141 10*3/uL — ABNORMAL LOW (ref 150–400)
RBC: 4.85 MIL/uL (ref 3.87–5.11)
RDW: 15.2 % (ref 11.5–15.5)
Smear Review: NORMAL
WBC Count: 5.3 10*3/uL (ref 4.0–10.5)
nRBC: 0 % (ref 0.0–0.2)

## 2023-06-09 LAB — HEPATITIS PANEL, ACUTE
HCV Ab: NONREACTIVE
Hep A IgM: NONREACTIVE
Hep B C IgM: NONREACTIVE
Hepatitis B Surface Ag: NONREACTIVE

## 2023-06-09 LAB — LACTATE DEHYDROGENASE: LDH: 197 U/L — ABNORMAL HIGH (ref 98–192)

## 2023-06-09 LAB — HIV ANTIBODY (ROUTINE TESTING W REFLEX): HIV Screen 4th Generation wRfx: NONREACTIVE

## 2023-06-09 LAB — VITAMIN B12: Vitamin B-12: 242 pg/mL (ref 180–914)

## 2023-06-09 LAB — FOLATE: Folate: 13.7 ng/mL (ref >5.9)

## 2023-06-09 NOTE — Progress Notes (Signed)
 [facility] [phone]   [phone]  CONSULT NOTE  REFERRING PHYSICIAN: Dr. Irven Coe  REASON FOR CONSULTATION:  83 years old white female with thrombocytopenia.  HPI Elizabeth Estrada is a 83 y.o. female Discussed the use of AI scribe software for clinical note transcription with the patient, who gave verbal consent to proceed.  History of Present Illness   Elizabeth Estrada is an 83 year old female who presents with low platelets. She was referred by her primary care provider for evaluation of low platelets.  Low platelets were first identified during a routine visit. Her platelet count was normal at 179,000 on February 13, 2017, but decreased to 148,000 by January 25, 2020, further dropping to 140,000 on March 05, 2023, and 129,000 on April 04, 2023. Throughout this period, her white blood cell count, hemoglobin, and hematocrit remained normal. No new medications or changes in dosage of her current medications since 2021.  She experiences occasional epistaxis associated with allergies but denies regular gum bleeding, easy bruising, hematuria, or melena. She has gastrointestinal changes, including mild diarrhea once a day, but denies nausea, vomiting, or significant weight loss, noting that her weight fluctuates.  Her current medications include Orencia for rheumatoid arthritis, Lipitor for hyperlipidemia, and Lexapro for depression, with no recent changes in these medications. She has a known allergy to methotrexate, which she previously took for rheumatoid arthritis.  Her past medical history includes rheumatoid arthritis, hyperlipidemia, depression, melanoma, nephrolithiasis, heart disease, fibromyalgia, GERD, and AV block. Her family history includes pancreatic cancer in her father and heart disease in her mother, with no known family history of blood disorders or low platelets. She is married, has an adopted daughter, and worked as a  Scientist, physiological. She does not smoke, drink alcohol, or use drugs.      HPI  Past Medical History:  Diagnosis Date   AV block    a. 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 Jamey Ripa   CARDIAC CATHETERIZATION     CARDIOVASCULAR STRESS TEST  10/19/1999, 2013   EF 97%, NO EVIDENCE OF ISCHEMIA, 2013 NORMAL   CATARACT EXTRACTION W/ INTRAOCULAR LENS  IMPLANT, BILATERAL Bilateral    COLONOSCOPY     CORONARY STENT INTERVENTION N/A 08/08/2017   Procedure: CORONARY STENT INTERVENTION;  Surgeon: Lyn Records, MD;  Location: [facility];  Service: Cardiovascular;  Laterality: N/A;   DILATATION & CURRETTAGE/HYSTEROSCOPY WITH RESECTOCOPE N/A 07/06/2013   Procedure: DILATATION & CURETTAGE/HYSTEROSCOPY WITH RESECTOCOPE with removal of endometrial lesion;  Surgeon: Annamaria Boots, MD;  Location: [facility];  Service: Gynecology;  Laterality: N/A;   LEAD REVISION/REPAIR N/A 10/13/2017   Procedure: LEAD REVISION/REPAIR;  Surgeon: Elberta Fortis,  Andree Coss, MD;  Location: [facility];  Service: Cardiovascular;  Laterality: N/A;   MELANOMA EXCISION  1990s?   from back   PACEMAKER IMPLANT N/A 08/11/2017   Procedure: PACEMAKER IMPLANT;  Surgeon: Regan Lemming, MD;  Location: [facility];  Service: Cardiovascular;  Laterality: N/A;   RIGHT/LEFT HEART CATH AND  CORONARY ANGIOGRAPHY N/A 08/08/2017   Procedure: RIGHT/LEFT HEART CATH AND CORONARY ANGIOGRAPHY;  Surgeon: Lyn Records, MD;  Location: [facility];  Service: Cardiovascular;  Laterality: N/A;    Family History  Problem Relation Age of Onset   Coronary artery disease Mother    Pancreatic cancer Father    Heart disease Father        bypass   Hypertension Father    CAD Brother 85       CABG and AVR    Social History Social History   Tobacco Use   Smoking status: Never   Smokeless tobacco: Never  Vaping Use   Vaping status: Never Used  Substance Use Topics   Alcohol use: Never   Drug use: Never    Allergies  Allergen Reactions   Methotrexate Derivatives Itching and Swelling   Plaquenil [Hydroxychloroquine Sulfate] Other (See Comments)    Unknown   Remicade [Infliximab] Nausea And Vomiting and Swelling   Ridaura [Auranofin] Other (See Comments)    unknown   Sudafed [Pseudoephedrine] Other (See Comments)    nervous    Current Outpatient Medications  Medication Sig Dispense Refill   abatacept (ORENCIA) 250 MG injection Inject 250 mg into the vein every 30 (thirty) days.      aspirin 81 MG tablet Take 81 mg by mouth daily.     atorvastatin (LIPITOR) 40 MG tablet TAKE 1 TABLET BY MOUTH EVERY DAY 90 tablet 2   cetirizine (ZYRTEC) 10 MG tablet Take 10 mg by mouth daily as needed for allergies.     denosumab (PROLIA) 60 MG/ML SOSY injection Inject 60 mg into the skin every 6 (six) months.     escitalopram (LEXAPRO) 20 MG tablet Take 20 mg by mouth at bedtime.      fluticasone (FLONASE) 50 MCG/ACT nasal spray Place 1-2 sprays into both nostrils daily as needed for allergies or rhinitis.     latanoprost (XALATAN) 0.005 % ophthalmic solution SMARTSIG:1 Drop(s) In Eye(s)     No current facility-administered medications for this visit.    Review of Systems  Constitutional: negative Eyes: negative Ears, nose, mouth, throat, and face: negative Respiratory:  negative Cardiovascular: negative Gastrointestinal: negative Genitourinary:negative Integument/breast: negative Hematologic/lymphatic: negative Musculoskeletal:negative Neurological: negative Behavioral/Psych: negative Endocrine: negative Allergic/Immunologic: negative  Physical Exam  ZOX:WRUEA, healthy, no distress, well nourished, and well developed SKIN: skin color, texture, turgor are normal, no rashes or significant lesions HEAD: Normocephalic, No masses, lesions, tenderness or abnormalities EYES: normal, PERRLA, Conjunctiva are pink and non-injected EARS: External ears normal, Canals clear OROPHARYNX:no exudate, no erythema, and lips, buccal mucosa, and tongue normal  NECK: supple, no adenopathy, no JVD LYMPH:  no palpable lymphadenopathy, no hepatosplenomegaly BREAST:not examined LUNGS: clear to auscultation , and palpation HEART: regular rate & rhythm, no murmurs, and no gallops ABDOMEN:abdomen soft, non-tender, normal bowel sounds, and no masses or organomegaly BACK: Back symmetric, no curvature., No CVA tenderness EXTREMITIES:no joint deformities, effusion, or inflammation, no edema  NEURO: alert & oriented x 3 with fluent speech, no focal motor/sensory deficits  PERFORMANCE STATUS: ECOG 0  LABORATORY DATA: Lab Results  Component Value Date  WBC 8.8 09/23/2018   HGB 13.2 09/23/2018   HCT 41.1 09/23/2018   MCV 88 09/23/2018   PLT 165 09/23/2018      Chemistry      Component Value Date/Time   NA 142 02/28/2023 1238   K 4.9 02/28/2023 1238   CL 106 02/28/2023 1238   CO2 25 02/28/2023 1238   BUN 15 02/28/2023 1238   CREATININE 0.77 02/28/2023 1238      Component Value Date/Time   CALCIUM 8.9 02/28/2023 1238   ALKPHOS 90 02/28/2023 1238   AST 32 02/28/2023 1238   ALT 17 02/28/2023 1238   BILITOT 1.0 02/28/2023 1238       RADIOGRAPHIC STUDIES: No results found.  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 No significant bleeding or bruising. Likely drug-induced or related to rheumatoid arthritis. Current platelet count 141,000. Counts above 100,000 are not immediately concerning. Discussed that even major surgeries can be performed with current platelet levels. Reviewed potential causes including medications and rheumatoid arthritis. Awaiting lab results for further evaluation. - Check current platelet count. it is 141K today. - Review lab results including hepatitis panel, HIV, rheumatoid factor, vitamin B12, and folate - Follow-up in six months  Rheumatoid Arthritis Long-standing rheumatoid arthritis managed with Orencia. Discussed potential contribution to thrombocytopenia. - Continue current medication regimen - Monitor for changes in symptoms or medication side effects  Hyperlipidemia Managed with Lipitor. - Continue current medication regimen - Monitor lipid levels as per primary care provider  Depression Managed with Lexapro. - Continue current medication regimen - Monitor for changes in symptoms  General Health Maintenance No smoking or alcohol use. No recent weight loss. History of melanoma, kidney stones, heart disease, fibromyalgia, GERD, and AV block. No family history of blood disorders. - Routine follow-up with primary care provider - Monitor for new symptoms or changes in existing conditions  Follow-up - Schedule follow-up appointment in six months.   The patient was advised to call immediately if she has any concerning symptoms in the interval. The patient voices understanding of current disease status and treatment options and is in agreement with the current care plan.  All questions were answered. The patient knows to call the clinic with any problems, questions or concerns. We can certainly see the patient much sooner if necessary.  Thank you so much for allowing me to  participate in the care of Elizabeth Estrada. I will continue to follow up the patient with you and assist in her care.  The total time spent in the appointment was 60 minutes.  Disclaimer: This note was dictated with voice recognition software. Similar sounding words can inadvertently be transcribed and may not be corrected upon review.   Lajuana Matte June 09, 2023, 11:40 AM

## 2023-06-10 ENCOUNTER — Telehealth: Payer: Self-pay | Admitting: Internal Medicine

## 2023-06-10 LAB — RHEUMATOID FACTOR: Rheumatoid fact SerPl-aCnc: <10 [IU]/mL (ref <14.0)

## 2023-06-10 NOTE — Telephone Encounter (Signed)
 Scheduled appointments per 2/24 LOS notes. Left the patient a voicemail with the scheduled appointment details and will be mailed an appointment reminder.

## 2023-06-14 ENCOUNTER — Other Ambulatory Visit: Payer: Self-pay | Admitting: Cardiology

## 2023-06-14 LAB — FANA STAINING PATTERNS
Homogeneous Pattern: 1
Speckled Pattern: 24529

## 2023-06-14 LAB — ANTINUCLEAR ANTIBODIES, IFA: ANA Ab, IFA: POSITIVE — AB

## 2023-06-24 DIAGNOSIS — M0579 Rheumatoid arthritis with rheumatoid factor of multiple sites without organ or systems involvement: Secondary | ICD-10-CM | POA: Diagnosis not present

## 2023-06-26 DIAGNOSIS — I2510 Atherosclerotic heart disease of native coronary artery without angina pectoris: Secondary | ICD-10-CM | POA: Diagnosis not present

## 2023-06-26 DIAGNOSIS — I10 Essential (primary) hypertension: Secondary | ICD-10-CM | POA: Diagnosis not present

## 2023-06-26 DIAGNOSIS — I25119 Atherosclerotic heart disease of native coronary artery with unspecified angina pectoris: Secondary | ICD-10-CM | POA: Diagnosis not present

## 2023-07-02 DIAGNOSIS — H00021 Hordeolum internum right upper eyelid: Secondary | ICD-10-CM | POA: Diagnosis not present

## 2023-07-14 DIAGNOSIS — I10 Essential (primary) hypertension: Secondary | ICD-10-CM | POA: Diagnosis not present

## 2023-07-14 DIAGNOSIS — I25119 Atherosclerotic heart disease of native coronary artery with unspecified angina pectoris: Secondary | ICD-10-CM | POA: Diagnosis not present

## 2023-07-14 DIAGNOSIS — M1990 Unspecified osteoarthritis, unspecified site: Secondary | ICD-10-CM | POA: Diagnosis not present

## 2023-07-14 DIAGNOSIS — F322 Major depressive disorder, single episode, severe without psychotic features: Secondary | ICD-10-CM | POA: Diagnosis not present

## 2023-07-15 ENCOUNTER — Ambulatory Visit (INDEPENDENT_AMBULATORY_CARE_PROVIDER_SITE_OTHER): Payer: Medicare Other

## 2023-07-15 DIAGNOSIS — I442 Atrioventricular block, complete: Secondary | ICD-10-CM | POA: Diagnosis not present

## 2023-07-16 LAB — CUP PACEART REMOTE DEVICE CHECK
Battery Remaining Longevity: 46 mo
Battery Voltage: 2.95 V
Brady Statistic AP VP Percent: 46.3 %
Brady Statistic AP VS Percent: 0 %
Brady Statistic AS VP Percent: 53.7 %
Brady Statistic AS VS Percent: 0 %
Brady Statistic RA Percent Paced: 46.21 %
Brady Statistic RV Percent Paced: 99.99 %
Date Time Interrogation Session: 20250331212937
Implantable Lead Connection Status: 753985
Implantable Lead Connection Status: 753985
Implantable Lead Implant Date: 20190429
Implantable Lead Implant Date: 20190429
Implantable Lead Location: 753859
Implantable Lead Location: 753860
Implantable Lead Model: 5076
Implantable Lead Model: 5076
Implantable Pulse Generator Implant Date: 20190429
Lead Channel Impedance Value: 342 Ohm
Lead Channel Impedance Value: 437 Ohm
Lead Channel Impedance Value: 475 Ohm
Lead Channel Impedance Value: 513 Ohm
Lead Channel Pacing Threshold Amplitude: 0.5 V
Lead Channel Pacing Threshold Amplitude: 0.5 V
Lead Channel Pacing Threshold Pulse Width: 0.4 ms
Lead Channel Pacing Threshold Pulse Width: 0.4 ms
Lead Channel Sensing Intrinsic Amplitude: 18.75 mV
Lead Channel Sensing Intrinsic Amplitude: 18.75 mV
Lead Channel Sensing Intrinsic Amplitude: 2.375 mV
Lead Channel Sensing Intrinsic Amplitude: 2.375 mV
Lead Channel Setting Pacing Amplitude: 1.5 V
Lead Channel Setting Pacing Amplitude: 2.5 V
Lead Channel Setting Pacing Pulse Width: 0.4 ms
Lead Channel Setting Sensing Sensitivity: 1.2 mV
Zone Setting Status: 755011
Zone Setting Status: 755011

## 2023-07-22 DIAGNOSIS — M0579 Rheumatoid arthritis with rheumatoid factor of multiple sites without organ or systems involvement: Secondary | ICD-10-CM | POA: Diagnosis not present

## 2023-08-04 DIAGNOSIS — H04123 Dry eye syndrome of bilateral lacrimal glands: Secondary | ICD-10-CM | POA: Diagnosis not present

## 2023-08-04 DIAGNOSIS — H00021 Hordeolum internum right upper eyelid: Secondary | ICD-10-CM | POA: Diagnosis not present

## 2023-08-04 DIAGNOSIS — H26493 Other secondary cataract, bilateral: Secondary | ICD-10-CM | POA: Diagnosis not present

## 2023-08-04 DIAGNOSIS — H40053 Ocular hypertension, bilateral: Secondary | ICD-10-CM | POA: Diagnosis not present

## 2023-08-04 DIAGNOSIS — Z961 Presence of intraocular lens: Secondary | ICD-10-CM | POA: Diagnosis not present

## 2023-08-04 DIAGNOSIS — D492 Neoplasm of unspecified behavior of bone, soft tissue, and skin: Secondary | ICD-10-CM | POA: Diagnosis not present

## 2023-08-13 DIAGNOSIS — I25119 Atherosclerotic heart disease of native coronary artery with unspecified angina pectoris: Secondary | ICD-10-CM | POA: Diagnosis not present

## 2023-08-13 DIAGNOSIS — I10 Essential (primary) hypertension: Secondary | ICD-10-CM | POA: Diagnosis not present

## 2023-08-13 DIAGNOSIS — F322 Major depressive disorder, single episode, severe without psychotic features: Secondary | ICD-10-CM | POA: Diagnosis not present

## 2023-08-13 DIAGNOSIS — M1990 Unspecified osteoarthritis, unspecified site: Secondary | ICD-10-CM | POA: Diagnosis not present

## 2023-08-19 DIAGNOSIS — M0579 Rheumatoid arthritis with rheumatoid factor of multiple sites without organ or systems involvement: Secondary | ICD-10-CM | POA: Diagnosis not present

## 2023-08-19 DIAGNOSIS — Z79899 Other long term (current) drug therapy: Secondary | ICD-10-CM | POA: Diagnosis not present

## 2023-08-27 NOTE — Addendum Note (Signed)
 Addended by: Lott Rouleau A on: 08/27/2023 02:46 PM   Modules accepted: Orders

## 2023-08-27 NOTE — Progress Notes (Signed)
 Remote pacemaker transmission.

## 2023-09-10 DIAGNOSIS — H40053 Ocular hypertension, bilateral: Secondary | ICD-10-CM | POA: Diagnosis not present

## 2023-09-10 DIAGNOSIS — D492 Neoplasm of unspecified behavior of bone, soft tissue, and skin: Secondary | ICD-10-CM | POA: Diagnosis not present

## 2023-09-10 DIAGNOSIS — H26493 Other secondary cataract, bilateral: Secondary | ICD-10-CM | POA: Diagnosis not present

## 2023-09-10 DIAGNOSIS — H04123 Dry eye syndrome of bilateral lacrimal glands: Secondary | ICD-10-CM | POA: Diagnosis not present

## 2023-09-13 DIAGNOSIS — M1990 Unspecified osteoarthritis, unspecified site: Secondary | ICD-10-CM | POA: Diagnosis not present

## 2023-09-13 DIAGNOSIS — I25119 Atherosclerotic heart disease of native coronary artery with unspecified angina pectoris: Secondary | ICD-10-CM | POA: Diagnosis not present

## 2023-09-13 DIAGNOSIS — F322 Major depressive disorder, single episode, severe without psychotic features: Secondary | ICD-10-CM | POA: Diagnosis not present

## 2023-09-13 DIAGNOSIS — I10 Essential (primary) hypertension: Secondary | ICD-10-CM | POA: Diagnosis not present

## 2023-09-16 DIAGNOSIS — M0579 Rheumatoid arthritis with rheumatoid factor of multiple sites without organ or systems involvement: Secondary | ICD-10-CM | POA: Diagnosis not present

## 2023-09-18 ENCOUNTER — Ambulatory Visit: Payer: Medicare Other

## 2023-10-02 ENCOUNTER — Ambulatory Visit

## 2023-10-02 VITALS — BP 170/64 | HR 60 | Temp 98.0°F | Resp 16 | Ht 62.0 in | Wt 115.2 lb

## 2023-10-02 DIAGNOSIS — M810 Age-related osteoporosis without current pathological fracture: Secondary | ICD-10-CM

## 2023-10-02 MED ORDER — DENOSUMAB 60 MG/ML ~~LOC~~ SOSY
60.0000 mg | PREFILLED_SYRINGE | Freq: Once | SUBCUTANEOUS | Status: AC
  Administered 2023-10-02: 60 mg via SUBCUTANEOUS

## 2023-10-02 NOTE — Progress Notes (Signed)
 Diagnosis: Osteoporosis  Provider:  Mannam, Praveen MD  Procedure: Injection  Prolia  (Denosumab ), Dose: 60 mg, Site: subcutaneous, Number of injections: 1  Injection Site(s): Right arm  Post Care: Patient declined observation  Discharge: Condition: Good, Destination: Home . AVS Declined  Performed by:  Rachelle Bue, RN

## 2023-10-12 DIAGNOSIS — I10 Essential (primary) hypertension: Secondary | ICD-10-CM | POA: Diagnosis not present

## 2023-10-12 DIAGNOSIS — I2510 Atherosclerotic heart disease of native coronary artery without angina pectoris: Secondary | ICD-10-CM | POA: Diagnosis not present

## 2023-10-12 DIAGNOSIS — I25119 Atherosclerotic heart disease of native coronary artery with unspecified angina pectoris: Secondary | ICD-10-CM | POA: Diagnosis not present

## 2023-10-13 DIAGNOSIS — H6122 Impacted cerumen, left ear: Secondary | ICD-10-CM | POA: Diagnosis not present

## 2023-10-13 DIAGNOSIS — H9201 Otalgia, right ear: Secondary | ICD-10-CM | POA: Diagnosis not present

## 2023-10-14 ENCOUNTER — Ambulatory Visit: Payer: Medicare Other | Attending: *Deleted

## 2023-10-14 DIAGNOSIS — M0579 Rheumatoid arthritis with rheumatoid factor of multiple sites without organ or systems involvement: Secondary | ICD-10-CM | POA: Diagnosis not present

## 2023-10-14 DIAGNOSIS — I442 Atrioventricular block, complete: Secondary | ICD-10-CM

## 2023-10-14 LAB — CUP PACEART REMOTE DEVICE CHECK
Battery Remaining Longevity: 40 mo
Battery Voltage: 2.94 V
Brady Statistic AP VP Percent: 55.21 %
Brady Statistic AP VS Percent: 0 %
Brady Statistic AS VP Percent: 44.77 %
Brady Statistic AS VS Percent: 0.01 %
Brady Statistic RA Percent Paced: 55.1 %
Brady Statistic RV Percent Paced: 99.98 %
Date Time Interrogation Session: 20250701042327
Implantable Lead Connection Status: 753985
Implantable Lead Connection Status: 753985
Implantable Lead Implant Date: 20190429
Implantable Lead Implant Date: 20190429
Implantable Lead Location: 753859
Implantable Lead Location: 753860
Implantable Lead Model: 5076
Implantable Lead Model: 5076
Implantable Pulse Generator Implant Date: 20190429
Lead Channel Impedance Value: 323 Ohm
Lead Channel Impedance Value: 437 Ohm
Lead Channel Impedance Value: 475 Ohm
Lead Channel Impedance Value: 532 Ohm
Lead Channel Pacing Threshold Amplitude: 0.375 V
Lead Channel Pacing Threshold Amplitude: 0.625 V
Lead Channel Pacing Threshold Pulse Width: 0.4 ms
Lead Channel Pacing Threshold Pulse Width: 0.4 ms
Lead Channel Sensing Intrinsic Amplitude: 2.25 mV
Lead Channel Sensing Intrinsic Amplitude: 2.25 mV
Lead Channel Sensing Intrinsic Amplitude: 22.75 mV
Lead Channel Sensing Intrinsic Amplitude: 22.75 mV
Lead Channel Setting Pacing Amplitude: 1.5 V
Lead Channel Setting Pacing Amplitude: 2.5 V
Lead Channel Setting Pacing Pulse Width: 0.4 ms
Lead Channel Setting Sensing Sensitivity: 1.2 mV
Zone Setting Status: 755011
Zone Setting Status: 755011

## 2023-10-20 ENCOUNTER — Ambulatory Visit: Payer: Self-pay | Admitting: Cardiology

## 2023-10-20 DIAGNOSIS — M810 Age-related osteoporosis without current pathological fracture: Secondary | ICD-10-CM | POA: Diagnosis not present

## 2023-10-20 DIAGNOSIS — M549 Dorsalgia, unspecified: Secondary | ICD-10-CM | POA: Diagnosis not present

## 2023-10-20 DIAGNOSIS — M1991 Primary osteoarthritis, unspecified site: Secondary | ICD-10-CM | POA: Diagnosis not present

## 2023-10-20 DIAGNOSIS — Z6820 Body mass index (BMI) 20.0-20.9, adult: Secondary | ICD-10-CM | POA: Diagnosis not present

## 2023-10-20 DIAGNOSIS — M0579 Rheumatoid arthritis with rheumatoid factor of multiple sites without organ or systems involvement: Secondary | ICD-10-CM | POA: Diagnosis not present

## 2023-10-20 DIAGNOSIS — M5136 Other intervertebral disc degeneration, lumbar region with discogenic back pain only: Secondary | ICD-10-CM | POA: Diagnosis not present

## 2023-10-27 ENCOUNTER — Encounter (INDEPENDENT_AMBULATORY_CARE_PROVIDER_SITE_OTHER): Payer: Self-pay | Admitting: Physician Assistant

## 2023-10-27 ENCOUNTER — Ambulatory Visit (INDEPENDENT_AMBULATORY_CARE_PROVIDER_SITE_OTHER): Admitting: Physician Assistant

## 2023-10-27 VITALS — BP 140/81 | HR 64

## 2023-10-27 DIAGNOSIS — H6121 Impacted cerumen, right ear: Secondary | ICD-10-CM | POA: Diagnosis not present

## 2023-10-27 NOTE — Progress Notes (Signed)
 Dear Dr. Auston, Here is my assessment for our mutual patient, Elizabeth Estrada. Thank you for allowing me the opportunity to care for your patient. Please do not hesitate to contact me should you have any other questions. Sincerely, Chyrl Cohen PA-C  Otolaryngology Clinic Note Referring provider: Dr. Auston HPI:  Elizabeth Estrada is a 83 y.o. female kindly referred by Dr. Auston   The patient is a 83 year old female seen in our office for evaluation of cerumen impaction.  The patient notes that she has had decreased hearing out of the right ear, she notes this is similar to previous episodes of cerumen impaction.  She notes some discomfort when laying on the right side, no significant pain at rest, no drainage, no history recurrent ear infections.   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 (HIS bundle lead)   Chronic depression    Coronary artery disease    Family history of adverse reaction to anesthesia    dad had PONV   Fibromyalgia    GERD (gastroesophageal reflux disease)    Heart murmur    History of kidney stones    Hypertension    before stent and pacemaker (10/13/2017)   Melanoma (HCC)    removed from back   MVP (mitral valve prolapse)    Osteoarthritis    Osteoporosis    Presence of permanent cardiac pacemaker 08/08/2017   RA (rheumatoid arthritis) (HCC)    all over; mainly hands and knees (10/13/2017)   Rib fracture      Past Surgical History:  Procedure Laterality Date   BREAST BIOPSY Left 11/2003   benign - Dr. Merrilyn   CARDIAC CATHETERIZATION     CARDIOVASCULAR STRESS TEST  10/19/1999, 2013   EF 97%, NO EVIDENCE OF ISCHEMIA, 2013 NORMAL   CATARACT EXTRACTION W/ INTRAOCULAR LENS  IMPLANT, BILATERAL Bilateral    COLONOSCOPY     CORONARY STENT INTERVENTION N/A 08/08/2017   Procedure: CORONARY STENT INTERVENTION;  Surgeon:  Claudene Victory ORN, MD;  Location: [facility];  Service: Cardiovascular;  Laterality: N/A;   DILATATION & CURRETTAGE/HYSTEROSCOPY WITH RESECTOCOPE N/A 07/06/2013   Procedure: DILATATION & CURETTAGE/HYSTEROSCOPY WITH RESECTOCOPE with removal of endometrial lesion;  Surgeon: Ronal Elvie Pinal, MD;  Location: [facility];  Service: Gynecology;  Laterality: N/A;   LEAD REVISION/REPAIR N/A 10/13/2017   Procedure: LEAD REVISION/REPAIR;  Surgeon: Inocencio Soyla Lunger, MD;  Location: [facility];  Service: Cardiovascular;  Laterality: N/A;   MELANOMA EXCISION  1990s?   from back   PACEMAKER IMPLANT N/A 08/11/2017   Procedure: PACEMAKER IMPLANT;  Surgeon: Inocencio Soyla Lunger, MD;  Location: [facility];  Service: Cardiovascular;  Laterality: N/A;   RIGHT/LEFT HEART CATH AND CORONARY ANGIOGRAPHY N/A 08/08/2017   Procedure: RIGHT/LEFT HEART CATH AND CORONARY ANGIOGRAPHY;  Surgeon: Claudene Victory ORN, MD;  Location: [facility];  Service: Cardiovascular;  Laterality: N/A;    Family History  Problem Relation Age of Onset   Coronary artery disease Mother    Pancreatic cancer Father    Heart disease Father        bypass   Hypertension Father    CAD Brother 74       CABG and AVR     Social Connections: Not on file      Current Outpatient Medications:    abatacept  (ORENCIA ) 250  MG injection, Inject 250 mg into the vein every 30 (thirty) days. , Disp: , Rfl:    aspirin  81 MG tablet, Take 81 mg by mouth daily., Disp: , Rfl:    atorvastatin  (LIPITOR ) 40 MG tablet, TAKE 1 TABLET BY MOUTH EVERY DAY, Disp: 90 tablet, Rfl: 3   cetirizine (ZYRTEC) 10 MG tablet, Take 10 mg by mouth daily as needed for allergies., Disp: , Rfl:    denosumab  (PROLIA ) 60 MG/ML SOSY injection, Inject 60 mg into the skin every 6 (six) months., Disp: , Rfl:    escitalopram  (LEXAPRO ) 20 MG tablet, Take 20 mg by mouth at bedtime. , Disp: , Rfl:    fluticasone  (FLONASE ) 50 MCG/ACT nasal spray, Place 1-2 sprays into both  nostrils daily as needed for allergies or rhinitis., Disp: , Rfl:    latanoprost (XALATAN) 0.005 % ophthalmic solution, SMARTSIG:1 Drop(s) In Eye(s), Disp: , Rfl:    Physical Exam:   BP (!) 140/81   Pulse 64   LMP 04/15/1990   SpO2 97%   Pertinent Findings  CN II-XII intact Right external auditory canal with cerumen impaction, left EAC clear with intact well pneumatized middle ear space Weber 512: equal Rinne 512: AC > BC b/l  Anterior rhinoscopy: Septum midline; bilateral inferior turbinates with no hypertrophy No lesions of oral cavity/oropharynx; dentition within normal limits No obviously palpable neck masses/lymphadenopathy/thyromegaly No respiratory distress or stridor  Seprately Identifiable Procedures:  Procedure: bilateral ear microscopy and cerumen removal using microscope (CPT 30789) - Mod 25 Pre-procedure diagnosis: unilateral cerumen impaction right external auditory canal Post-procedure diagnosis: same Indication: bilateral cerumen impaction; given patient's otologic complaints and history as well as for improved and comprehensive examination of external ear and tympanic membrane, bilateral otologic examination using microscope was performed and impacted cerumen removed  Procedure: Patient was placed semi-recumbent. Both ear canals were examined using the microscope with findings above. Cerumen removed from the right external auditory canal using suction and currette with improvement in EAC examination and patency. Left: EAC was patent. TM was intact . Middle ear was aerated. Drainage: none Right: EAC was patent. TM was intact . Middle ear was aerated . Drainage: none Patient tolerated the procedure well.   Impression & Plans:  Elizabeth Estrada is a 83 y.o. female with the following   Cerumen impaction-  Patient with very hard large amount of cerumen impaction, this was removed.  She did have some irritation of the ear canal after removal.  She will call our office if  develops any concerns from this.  Hearing seems to be relatively at baseline but she would like a audiological evaluation.  She will schedule this, I am happy to follow-up with the results.  I would like her to follow-up if she has any questions or concerns   - f/u PRN   Thank you for allowing me the opportunity to care for your patient. Please do not hesitate to contact me should you have any other questions.  Sincerely, Chyrl Cohen PA-C [facility] Phone: [phone] Fax: [phone]  10/27/2023, 1:43 PM

## 2023-11-11 DIAGNOSIS — M0579 Rheumatoid arthritis with rheumatoid factor of multiple sites without organ or systems involvement: Secondary | ICD-10-CM | POA: Diagnosis not present

## 2023-12-09 ENCOUNTER — Inpatient Hospital Stay (HOSPITAL_BASED_OUTPATIENT_CLINIC_OR_DEPARTMENT_OTHER): Payer: Medicare Other | Admitting: Internal Medicine

## 2023-12-09 ENCOUNTER — Telehealth: Payer: Self-pay | Admitting: Internal Medicine

## 2023-12-09 ENCOUNTER — Encounter: Payer: Self-pay | Admitting: Internal Medicine

## 2023-12-09 ENCOUNTER — Inpatient Hospital Stay: Payer: Medicare Other | Attending: Internal Medicine

## 2023-12-09 VITALS — BP 171/61 | HR 60 | Temp 97.6°F | Resp 17 | Ht 62.0 in | Wt 114.0 lb

## 2023-12-09 DIAGNOSIS — M069 Rheumatoid arthritis, unspecified: Secondary | ICD-10-CM | POA: Diagnosis not present

## 2023-12-09 DIAGNOSIS — D693 Immune thrombocytopenic purpura: Secondary | ICD-10-CM | POA: Insufficient documentation

## 2023-12-09 DIAGNOSIS — D696 Thrombocytopenia, unspecified: Secondary | ICD-10-CM

## 2023-12-09 DIAGNOSIS — M0579 Rheumatoid arthritis with rheumatoid factor of multiple sites without organ or systems involvement: Secondary | ICD-10-CM | POA: Diagnosis not present

## 2023-12-09 LAB — CBC WITH DIFFERENTIAL (CANCER CENTER ONLY)
Abs Immature Granulocytes: 0.01 K/uL (ref 0.00–0.07)
Basophils Absolute: 0 K/uL (ref 0.0–0.1)
Basophils Relative: 0 %
Eosinophils Absolute: 0.2 K/uL (ref 0.0–0.5)
Eosinophils Relative: 5 %
HCT: 42.5 % (ref 36.0–46.0)
Hemoglobin: 13.7 g/dL (ref 12.0–15.0)
Immature Granulocytes: 0 %
Lymphocytes Relative: 33 %
Lymphs Abs: 1.7 K/uL (ref 0.7–4.0)
MCH: 28.1 pg (ref 26.0–34.0)
MCHC: 32.2 g/dL (ref 30.0–36.0)
MCV: 87.3 fL (ref 80.0–100.0)
Monocytes Absolute: 0.4 K/uL (ref 0.1–1.0)
Monocytes Relative: 8 %
Neutro Abs: 2.8 K/uL (ref 1.7–7.7)
Neutrophils Relative %: 54 %
Platelet Count: 132 K/uL — ABNORMAL LOW (ref 150–400)
RBC: 4.87 MIL/uL (ref 3.87–5.11)
RDW: 14.6 % (ref 11.5–15.5)
WBC Count: 5.2 K/uL (ref 4.0–10.5)
nRBC: 0 % (ref 0.0–0.2)

## 2023-12-09 LAB — LACTATE DEHYDROGENASE: LDH: 191 U/L (ref 98–192)

## 2023-12-09 NOTE — Progress Notes (Signed)
 [facility] [phone]   [phone]  OFFICE PROGRESS NOTE  Leonel Cole, MD 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  PRIOR THERAPY: None  CURRENT THERAPY: Observation  INTERVAL HISTORY: Elizabeth Estrada 83 y.o. female returns to the clinic today for follow-up visit.  Discussed the use of AI scribe software for clinical note transcription with the patient, who gave verbal consent to proceed.  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  She has a history of chronic thrombocytopenia and a positive ANA test. No new complaints, bleeding, or bruising have been noted since her last visit six months ago. Her platelet count has decreased slightly from 141,000 to 132,000, but she has not experienced any significant symptoms related to this change.  She is concerned about her elevated blood pressure, with recent readings of 182/72 mmHg and 171/61 mmHg. She used to monitor her blood pressure at home but has stopped doing so. She is not currently taking any medication for hypertension, only for cholesterol.  She has a history of rheumatoid arthritis and is currently receiving Orencia  infusions for management.     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  MEDICATIONS:  Current Outpatient Medications  Medication Sig Dispense Refill   abatacept  (ORENCIA ) 250 MG injection Inject 250 mg into the vein every 30 (thirty) days.      aspirin  81 MG tablet Take 81 mg by mouth daily.     atorvastatin  (LIPITOR ) 40 MG tablet TAKE 1 TABLET BY MOUTH EVERY DAY 90 tablet 3   cetirizine (ZYRTEC) 10 MG tablet Take 10 mg by mouth daily as needed for allergies.     denosumab  (PROLIA ) 60 MG/ML SOSY injection Inject 60 mg into the skin every 6 (six) months.     escitalopram  (LEXAPRO ) 20 MG tablet Take 20 mg by mouth at bedtime.      fluticasone  (FLONASE ) 50 MCG/ACT nasal spray Place 1-2 sprays into both nostrils daily as needed for allergies or rhinitis.     latanoprost (XALATAN) 0.005 % ophthalmic solution SMARTSIG:1 Drop(s) In Eye(s)     No current facility-administered medications for this visit.    SURGICAL HISTORY:  Past Surgical History:  Procedure Laterality Date   BREAST BIOPSY Left 11/2003   benign - Dr. Merrilyn   CARDIAC CATHETERIZATION     CARDIOVASCULAR STRESS TEST  10/19/1999, 2013   EF 97%, NO EVIDENCE OF ISCHEMIA, 2013  NORMAL   CATARACT EXTRACTION W/ INTRAOCULAR LENS  IMPLANT, BILATERAL Bilateral    COLONOSCOPY     CORONARY STENT INTERVENTION N/A 08/08/2017   Procedure: CORONARY STENT INTERVENTION;  Surgeon: Claudene Victory ORN, MD;  Location: [facility];  Service: Cardiovascular;  Laterality: N/A;   DILATATION & CURRETTAGE/HYSTEROSCOPY WITH RESECTOCOPE N/A 07/06/2013   Procedure: DILATATION & CURETTAGE/HYSTEROSCOPY WITH RESECTOCOPE with removal of endometrial lesion;  Surgeon: Ronal Elvie Pinal, MD;  Location: [facility];  Service: Gynecology;   Laterality: N/A;   LEAD REVISION/REPAIR N/A 10/13/2017   Procedure: LEAD REVISION/REPAIR;  Surgeon: Inocencio Soyla Lunger, MD;  Location: [facility];  Service: Cardiovascular;  Laterality: N/A;   MELANOMA EXCISION  1990s?   from back   PACEMAKER IMPLANT N/A 08/11/2017   Procedure: PACEMAKER IMPLANT;  Surgeon: Inocencio Soyla Lunger, MD;  Location: [facility];  Service: Cardiovascular;  Laterality: N/A;   RIGHT/LEFT HEART CATH AND CORONARY ANGIOGRAPHY N/A 08/08/2017   Procedure: RIGHT/LEFT HEART CATH AND CORONARY ANGIOGRAPHY;  Surgeon: Claudene Victory ORN, MD;  Location: [facility];  Service: Cardiovascular;  Laterality: N/A;    REVIEW OF SYSTEMS:  A comprehensive review of systems was negative.   PHYSICAL EXAMINATION: General appearance: alert, cooperative, and no distress Head: Normocephalic, without obvious abnormality, atraumatic Neck: no adenopathy, no JVD, supple, symmetrical, trachea midline, and thyroid  not enlarged, symmetric, no tenderness/mass/nodules Lymph nodes: Cervical, supraclavicular, and axillary nodes normal. Resp: clear to auscultation bilaterally Back: symmetric, no curvature. ROM normal. No CVA tenderness. Cardio: regular rate and rhythm, S1, S2 normal, no murmur, click, rub or gallop GI: soft, non-tender; bowel sounds normal; no masses,  no organomegaly Extremities: extremities normal, atraumatic, no cyanosis or edema  ECOG PERFORMANCE STATUS: 1 - Symptomatic but completely ambulatory  Blood pressure (!) 171/61, pulse 60, temperature 97.6 F (36.4 C), temperature source Temporal, resp. rate 17, height 5' 2 (1.575 m), weight 114 lb (51.7 kg), last menstrual period 04/15/1990, SpO2 100%.  LABORATORY DATA: Lab Results  Component Value Date   WBC 5.2 12/09/2023   HGB 13.7 12/09/2023   HCT 42.5 12/09/2023   MCV 87.3 12/09/2023   PLT 132 (L) 12/09/2023      Chemistry      Component Value Date/Time   NA 140 06/09/2023 1125   NA 142 02/28/2023 1238    K 4.1 06/09/2023 1125   CL 108 06/09/2023 1125   CO2 28 06/09/2023 1125   BUN 19 06/09/2023 1125   BUN 15 02/28/2023 1238   CREATININE 0.84 06/09/2023 1125      Component Value Date/Time   CALCIUM  9.0 06/09/2023 1125   ALKPHOS 62 06/09/2023 1125   AST 27 06/09/2023 1125   ALT 14 06/09/2023 1125   BILITOT 1.2 06/09/2023 1125       RADIOGRAPHIC STUDIES: No results found.  ASSESSMENT AND PLAN:  Assessment and Plan Assessment & Plan Chronic immune thrombocytopenia (ITP) Chronic immune thrombocytopenia likely secondary to a rheumatologic disorder, possibly rheumatoid arthritis, given positive ANA and rheumatoid arthritis. Platelet count decreased from 141,000 to 132,000 over six months, but remains above the threshold for intervention. No significant bleeding or bruising reported. Current platelet count is acceptable, and no immediate treatment is necessary unless platelet count drops below 30,000 or significant bleeding occurs. - Monitor platelet count every six months. - Consider treatment with steroids if platelet count drops below 30,000 or if significant bleeding occurs. The patient was advised to call immediately if she has any concerning symptoms in  the interval. The patient voices understanding of current disease status and treatment options and is in agreement with the current care plan.  All questions were answered. The patient knows to call the clinic with any problems, questions or concerns. We can certainly see the patient much sooner if necessary. The total time spent in the appointment was 20 minutes including review of chart and various tests results, discussions about plan of care and coordination of care plan .   Disclaimer: This note was dictated with voice recognition software. Similar sounding words can inadvertently be transcribed and may not be corrected upon review.

## 2023-12-09 NOTE — Telephone Encounter (Signed)
 Scheduled appointments and spoke with the patient she is aware of day and times.

## 2023-12-11 ENCOUNTER — Encounter: Payer: Self-pay | Admitting: Internal Medicine

## 2023-12-18 DIAGNOSIS — N200 Calculus of kidney: Secondary | ICD-10-CM | POA: Diagnosis not present

## 2023-12-18 DIAGNOSIS — M069 Rheumatoid arthritis, unspecified: Secondary | ICD-10-CM | POA: Diagnosis not present

## 2023-12-18 DIAGNOSIS — R300 Dysuria: Secondary | ICD-10-CM | POA: Diagnosis not present

## 2023-12-19 ENCOUNTER — Telehealth: Payer: Self-pay

## 2023-12-19 NOTE — Telephone Encounter (Signed)
 Outreach made to Pt.    Per Pt she saw her PCP yesterday for blood in her urine.  She took her first dose of antibiotic last night.  Per alert Pt went in to AF last night at 12:24 AM.  She is rate controlled. Requested Pt send repeat transmission to determine if Pt was currently in AFib.  Transmission received and Pt is still in rate controlled Afib.   At this time current Afib episode is 9 hours.  Discussed with Dr. Swaziland.  Will have Pt send repeat transmission on Monday.  Will review length of episode or if current episode persists.  Will determine on Monday if Pt should begin OAC per Dr. Swaziland.  Pt advised of plan.  She will send a manual transmission on Monday morning and wait for further instruction.   Alert remote transmission:  AT/AF Daily Burden > Threshold AF in progress from 9/5 @ 00:24, controlled rates, hx of PAF with low burden, ASA only - route to triage high alert per protocol

## 2023-12-22 NOTE — Telephone Encounter (Signed)
 Follow up transmission received.   Presenting rhythm: AP/VP AT/AF burden: 10.2 % (since 12/19/23)> episode on 12/19/23 lasted ~ 16 hours.   Dr. Swaziland is currently out of the office all week.   Will forward to Charlies Arthur, [place] whom the patient has previously seen to review and advise further at this time.   ___________________________________________________________________________

## 2023-12-23 NOTE — Telephone Encounter (Signed)
 Spoke to patient to advise per Charlies, PA recommends AF clinic referral to discuss AF. Patient voiced understanding and agreeable to plan.

## 2023-12-27 DIAGNOSIS — Z23 Encounter for immunization: Secondary | ICD-10-CM | POA: Diagnosis not present

## 2024-01-01 ENCOUNTER — Ambulatory Visit (HOSPITAL_COMMUNITY)
Admission: RE | Admit: 2024-01-01 | Discharge: 2024-01-01 | Disposition: A | Discharge status: Home / Self Care | Source: Ambulatory Visit | Attending: Internal Medicine | Admitting: Internal Medicine

## 2024-01-01 ENCOUNTER — Encounter (HOSPITAL_COMMUNITY): Payer: Self-pay | Admitting: Internal Medicine

## 2024-01-01 VITALS — BP 148/80 | HR 60 | Ht 62.0 in | Wt 113.0 lb

## 2024-01-01 DIAGNOSIS — I480 Paroxysmal atrial fibrillation: Secondary | ICD-10-CM | POA: Insufficient documentation

## 2024-01-01 DIAGNOSIS — D6869 Other thrombophilia: Secondary | ICD-10-CM | POA: Insufficient documentation

## 2024-01-01 MED ORDER — WARFARIN SODIUM 5 MG PO TABS: 5.0000 mg | ORAL_TABLET | Freq: Every day | ORAL | 0 refills | Status: DC

## 2024-01-01 NOTE — Progress Notes (Addendum)
 Primary Care Physician: Leonel Cole, MD Primary Cardiologist: Peter Swaziland, MD Electrophysiologist: Will Gladis Norton, MD     Referring Physician: Device clinic      Elizabeth Estrada is a 83 y.o. female with a history of MVP/MS, HTN, CAD, PSVT, CHB s/p PPM, HLD, and atrial fibrillation who presents for consultation in the [facility]. Device clinic alert on 9/8 for episode on 9/5 which lasted for 16 hours. Patient has a CHADS2VASC score of 5.  On evaluation today, patient is currently in AV paced rhythm. Patient notes she may have been passing a kidney stone around the time of episode. She did not have cardiac awareness. She takes ASA daily.   Today, she denies symptoms of palpitations, chest pain, shortness of breath, orthopnea, PND, lower extremity edema, dizziness, presyncope, syncope, snoring, daytime somnolence, bleeding, or neurologic sequela. The patient is tolerating medications without difficulties and is otherwise without complaint today.    she has a BMI of Body mass index is 20.67 kg/m.SABRA Filed Weights   01/01/24 1109  Weight: 51.3 kg    Current Outpatient Medications  Medication Sig Dispense Refill   abatacept  (ORENCIA ) 250 MG injection Inject 250 mg into the vein every 30 (thirty) days.      atorvastatin  (LIPITOR ) 40 MG tablet TAKE 1 TABLET BY MOUTH EVERY DAY 90 tablet 3   cetirizine (ZYRTEC) 10 MG tablet Take 10 mg by mouth daily as needed for allergies.     denosumab  (PROLIA ) 60 MG/ML SOSY injection Inject 60 mg into the skin every 6 (six) months.     escitalopram  (LEXAPRO ) 20 MG tablet Take 20 mg by mouth at bedtime.      fluticasone  (FLONASE ) 50 MCG/ACT nasal spray Place 1-2 sprays into both nostrils daily as needed for allergies or rhinitis.     latanoprost (XALATAN) 0.005 % ophthalmic solution SMARTSIG:1 Drop(s) In Eye(s)     warfarin (COUMADIN ) 5 MG tablet Take 1 tablet (5 mg total) by mouth daily. 30 tablet 0   No current  facility-administered medications for this encounter.    Atrial Fibrillation Management history:  Previous antiarrhythmic drugs: none Previous cardioversions: none Previous ablations: none Anticoagulation history: none   ROS- All systems are reviewed and negative except as per the HPI above.  Physical Exam: BP (!) 148/80   Pulse 60   Ht 5' 2 (1.575 m)   Wt 51.3 kg   LMP 04/15/1990   BMI 20.67 kg/m   GEN: Well nourished, well developed in no acute distress NECK: No JVD; No carotid bruits CARDIAC: Regular rate and rhythm, no murmurs, rubs, gallops RESPIRATORY:  Clear to auscultation without rales, wheezing or rhonchi  ABDOMEN: Soft, non-tender, non-distended EXTREMITIES:  No edema; No deformity   EKG today demonstrates  Vent. rate 60 BPM PR interval 178 ms QRS duration 114 ms QT/QTcB 474/474 ms P-R-T axes 34 -60 83 AV dual-paced rhythm Abnormal ECG When compared with ECG of 14-Oct-2017 07:06, Electronic ventricular pacemaker has replaced Sinus rhythm  Echo 09/12/21 demonstrated  1. Left ventricular ejection fraction, by estimation, is 60 to 65%. The  left ventricle has normal function. The left ventricle has no regional  wall motion abnormalities. Left ventricular diastolic parameters are  consistent with Grade II diastolic  dysfunction (pseudonormalization). Elevated left atrial pressure.   2. Right ventricular systolic function is normal. The right ventricular  size is normal. There is mildly elevated pulmonary artery systolic  pressure. The estimated right ventricular systolic pressure is 38.5  mmHg.   3. Moderate pericardial effusion. There is no evidence of cardiac  tamponade.   4. The mitral valve is abnormal. Moderate leaflet thickening, No evidence  of mitral valve regurgitation. Moderate mitral stenosis. The mean mitral  valve gradient is 7.0 mmHg with average heart rate of 60 bpm. MVA 1.4 cm^2  by continuity equation.   5. Tricuspid valve regurgitation is  moderate.   6. The aortic valve is tricuspid. Aortic valve regurgitation is mild.  Aortic valve sclerosis is present, with no evidence of aortic valve  stenosis.   7. The inferior vena cava is normal in size with greater than 50%  respiratory variability, suggesting right atrial pressure of 3 mmHg.    ASSESSMENT & PLAN CHA2DS2-VASc Score = 5  The patient's score is based upon: CHF History: 0 HTN History: 1 Diabetes History: 0 Stroke History: 0 Vascular Disease History: 1 Age Score: 2 Gender Score: 1       ASSESSMENT AND PLAN: Paroxysmal Atrial Fibrillation (ICD10:  I48.0) The patient's CHA2DS2-VASc score is 5, indicating a 7.2% annual risk of stroke.    Patient is currently in AV paced rhythm. Education provided about Afib. Discussion about triggers for Afib. After discussion, we will proceed with conservative observation at this time via subsequent device checks.    Secondary Hypercoagulable State (ICD10:  D68.69) The patient is at significant risk for stroke/thromboembolism based upon her CHA2DS2-VASc Score of 5.  Start Warfarin (Coumadin ).  We discussed the reasoning behind anticoagulation in the setting of stroke prevention related to Afib. We discussed the benefits vs risks of anticoagulation. After discussion, patient would like to begin anticoagulation and understands the potential risks. After discussion with primary cardiologist, agree due to new onset Afib recommendation for patient to start coumadin  due to moderate MS. Stop ASA. Will refer to coumadin  clinic. Begin coumadin  5 mg daily and defer titration to coumadin  clinic going forward. There is no interaction noted between Orencia  and coumadin .    Follow up per recall with Dr. Swaziland and Dr. Inocencio. Follow up 6 months from then Afib clinic.    [name], [facility]  Afib Clinic [address] [place], [place] [address] [phone]

## 2024-01-01 NOTE — Patient Instructions (Addendum)
 Start coumadin  5 mg once a day   Stop aspirin 

## 2024-01-06 DIAGNOSIS — M0579 Rheumatoid arthritis with rheumatoid factor of multiple sites without organ or systems involvement: Secondary | ICD-10-CM | POA: Diagnosis not present

## 2024-01-13 ENCOUNTER — Ambulatory Visit: Payer: Medicare Other

## 2024-01-13 ENCOUNTER — Ambulatory Visit: Attending: Cardiology | Admitting: Pharmacist

## 2024-01-13 DIAGNOSIS — I480 Paroxysmal atrial fibrillation: Secondary | ICD-10-CM

## 2024-01-13 DIAGNOSIS — D6869 Other thrombophilia: Secondary | ICD-10-CM | POA: Diagnosis not present

## 2024-01-13 DIAGNOSIS — Z7901 Long term (current) use of anticoagulants: Secondary | ICD-10-CM | POA: Insufficient documentation

## 2024-01-13 DIAGNOSIS — I4891 Unspecified atrial fibrillation: Secondary | ICD-10-CM | POA: Insufficient documentation

## 2024-01-13 LAB — POCT INR: INR: 8 — AB (ref 2.0–3.0)

## 2024-01-13 LAB — PROTIME-INR
INR: >10 (ref 0.9–1.2)
Prothrombin Time: >120 s — ABNORMAL HIGH (ref 9.1–12.0)

## 2024-01-13 NOTE — Patient Instructions (Signed)
 Description   INR >10. Called and spoke with patient. No signs of bleeding or bruising. Ate greens last night and advised her to eat more today. Will continue to hold warfarin until Monday and then switch dose to 1/2 tablet daily (2.5mg ) and recheck INR 01/22/24  Coumadin  Clinic - [phone]

## 2024-01-13 NOTE — Progress Notes (Signed)
A full discussion of the nature of anticoagulants has been carried out.  A benefit risk analysis has been presented to the patient, so that they understand the justification for choosing anticoagulation at this time. The need for frequent and regular monitoring, precise dosage adjustment and compliance is stressed.  Side effects of potential bleeding are discussed.  The patient should avoid any OTC items containing aspirin or ibuprofen, and should avoid great swings in general diet.  Avoid alcohol consumption.  Call if any signs of abnormal bleeding.  Next PT/INR test in 1 week; telephone follow up thereafter.

## 2024-01-14 ENCOUNTER — Ambulatory Visit: Payer: Self-pay | Admitting: Cardiology

## 2024-01-14 LAB — CUP PACEART REMOTE DEVICE CHECK
Battery Remaining Longevity: 38 mo
Battery Voltage: 2.94 V
Brady Statistic AP VP Percent: 47.48 %
Brady Statistic AP VS Percent: 0 %
Brady Statistic AS VP Percent: 52.51 %
Brady Statistic AS VS Percent: 0 %
Brady Statistic RA Percent Paced: 47.43 %
Brady Statistic RV Percent Paced: 100 %
Date Time Interrogation Session: 20250929212729
Implantable Lead Connection Status: 753985
Implantable Lead Connection Status: 753985
Implantable Lead Implant Date: 20190429
Implantable Lead Implant Date: 20190429
Implantable Lead Location: 753859
Implantable Lead Location: 753860
Implantable Lead Model: 5076
Implantable Lead Model: 5076
Implantable Pulse Generator Implant Date: 20190429
Lead Channel Impedance Value: 323 Ohm
Lead Channel Impedance Value: 418 Ohm
Lead Channel Impedance Value: 475 Ohm
Lead Channel Impedance Value: 513 Ohm
Lead Channel Pacing Threshold Amplitude: 0.375 V
Lead Channel Pacing Threshold Amplitude: 0.5 V
Lead Channel Pacing Threshold Pulse Width: 0.4 ms
Lead Channel Pacing Threshold Pulse Width: 0.4 ms
Lead Channel Sensing Intrinsic Amplitude: 2.625 mV
Lead Channel Sensing Intrinsic Amplitude: 2.625 mV
Lead Channel Sensing Intrinsic Amplitude: 24.5 mV
Lead Channel Sensing Intrinsic Amplitude: 24.5 mV
Lead Channel Setting Pacing Amplitude: 1.5 V
Lead Channel Setting Pacing Amplitude: 2.5 V
Lead Channel Setting Pacing Pulse Width: 0.4 ms
Lead Channel Setting Sensing Sensitivity: 1.2 mV
Zone Setting Status: 755011
Zone Setting Status: 755011

## 2024-01-14 NOTE — Progress Notes (Signed)
 Remote PPM Transmission

## 2024-01-19 NOTE — Progress Notes (Signed)
 Remote PPM Transmission

## 2024-01-20 DIAGNOSIS — Z23 Encounter for immunization: Secondary | ICD-10-CM | POA: Diagnosis not present

## 2024-01-22 ENCOUNTER — Ambulatory Visit: Attending: Cardiology

## 2024-01-23 ENCOUNTER — Other Ambulatory Visit (HOSPITAL_COMMUNITY): Payer: Self-pay | Admitting: Internal Medicine

## 2024-01-23 DIAGNOSIS — I4891 Unspecified atrial fibrillation: Secondary | ICD-10-CM

## 2024-01-23 NOTE — Telephone Encounter (Signed)
 Warfarin 5mg  refill Afib Last INR 01/13/24 and has an appt pending 01/26/24 Last OV 01/01/24  NO 90 DAY SUPPLY SENT, THIS IS NEW PATIENT AND WORKING ON PERMANENT DOSEAGE

## 2024-01-26 ENCOUNTER — Ambulatory Visit: Attending: Cardiology | Admitting: *Deleted

## 2024-01-26 DIAGNOSIS — I4891 Unspecified atrial fibrillation: Secondary | ICD-10-CM | POA: Diagnosis not present

## 2024-01-26 DIAGNOSIS — Z1231 Encounter for screening mammogram for malignant neoplasm of breast: Secondary | ICD-10-CM | POA: Diagnosis not present

## 2024-01-26 DIAGNOSIS — Z7901 Long term (current) use of anticoagulants: Secondary | ICD-10-CM | POA: Insufficient documentation

## 2024-01-26 LAB — POCT INR: INR: 2 (ref 2.0–3.0)

## 2024-01-26 NOTE — Progress Notes (Signed)
 Description   INR-2.0; Continue taking 1/2 tablet daily (2.5mg ). Continue eating leafy green veggies 2 times per week. Coumadin  [phone]

## 2024-01-26 NOTE — Patient Instructions (Signed)
 Description   INR-2.0; Continue taking 1/2 tablet daily (2.5mg ). Continue eating leafy green veggies 2 times per week. Coumadin  [phone]

## 2024-02-03 ENCOUNTER — Ambulatory Visit

## 2024-02-03 DIAGNOSIS — M0579 Rheumatoid arthritis with rheumatoid factor of multiple sites without organ or systems involvement: Secondary | ICD-10-CM | POA: Diagnosis not present

## 2024-02-03 DIAGNOSIS — R5383 Other fatigue: Secondary | ICD-10-CM | POA: Diagnosis not present

## 2024-02-03 DIAGNOSIS — Z79899 Other long term (current) drug therapy: Secondary | ICD-10-CM | POA: Diagnosis not present

## 2024-02-04 DIAGNOSIS — R928 Other abnormal and inconclusive findings on diagnostic imaging of breast: Secondary | ICD-10-CM | POA: Diagnosis not present

## 2024-02-09 ENCOUNTER — Ambulatory Visit: Attending: Cardiology | Admitting: *Deleted

## 2024-02-09 DIAGNOSIS — Z7901 Long term (current) use of anticoagulants: Secondary | ICD-10-CM | POA: Insufficient documentation

## 2024-02-09 DIAGNOSIS — I4891 Unspecified atrial fibrillation: Secondary | ICD-10-CM | POA: Diagnosis not present

## 2024-02-09 LAB — POCT INR: INR: 3.1 — AB (ref 2.0–3.0)

## 2024-02-09 NOTE — Patient Instructions (Signed)
 Description   INR-3.1; Do not take any warfarin tonight then continue taking 1/2 tablet daily (2.5mg ). Continue eating leafy green veggies 2 times per week. Coumadin  Clinic-[phone]

## 2024-02-09 NOTE — Progress Notes (Signed)
  Description   INR-3.1; Do not take any warfarin tonight then continue taking 1/2 tablet daily (2.5mg ). Continue eating leafy green veggies 2 times per week. Coumadin  Clinic-[phone]

## 2024-02-16 ENCOUNTER — Ambulatory Visit (HOSPITAL_COMMUNITY)
Admission: RE | Admit: 2024-02-16 | Discharge: 2024-02-16 | Disposition: A | Discharge status: Home / Self Care | Payer: Medicare Other | Source: Ambulatory Visit | Attending: Cardiology | Admitting: Cardiology

## 2024-02-16 ENCOUNTER — Ambulatory Visit (INDEPENDENT_AMBULATORY_CARE_PROVIDER_SITE_OTHER)

## 2024-02-16 DIAGNOSIS — I442 Atrioventricular block, complete: Secondary | ICD-10-CM | POA: Diagnosis not present

## 2024-02-16 DIAGNOSIS — I3139 Other pericardial effusion (noninflammatory): Secondary | ICD-10-CM | POA: Diagnosis not present

## 2024-02-16 DIAGNOSIS — I2510 Atherosclerotic heart disease of native coronary artery without angina pectoris: Secondary | ICD-10-CM | POA: Insufficient documentation

## 2024-02-16 DIAGNOSIS — Z7901 Long term (current) use of anticoagulants: Secondary | ICD-10-CM

## 2024-02-16 DIAGNOSIS — I4891 Unspecified atrial fibrillation: Secondary | ICD-10-CM

## 2024-02-16 DIAGNOSIS — I342 Nonrheumatic mitral (valve) stenosis: Secondary | ICD-10-CM

## 2024-02-16 DIAGNOSIS — Z950 Presence of cardiac pacemaker: Secondary | ICD-10-CM | POA: Insufficient documentation

## 2024-02-16 DIAGNOSIS — I050 Rheumatic mitral stenosis: Secondary | ICD-10-CM | POA: Diagnosis not present

## 2024-02-16 LAB — POCT INR: INR: 2.1 (ref 2.0–3.0)

## 2024-02-16 LAB — ECHOCARDIOGRAM COMPLETE
Area-P 1/2: 2.32 cm2
MV VTI: 1.45 cm2
P 1/2 time: 407 ms
S' Lateral: 2 cm

## 2024-02-16 NOTE — Patient Instructions (Signed)
 continue taking 1/2 tablet daily (2.5mg ). Continue eating leafy green veggies 2 times per week. Coumadin  Clinic-[phone] INR in 2 weeks

## 2024-02-16 NOTE — Progress Notes (Signed)
 INR 2.1 Please see anticoagulation encounter continue taking 1/2 tablet daily (2.5mg ). Continue eating leafy green veggies 2 times per week. Coumadin  Clinic-[phone] INR in 2 weeks

## 2024-02-17 ENCOUNTER — Ambulatory Visit: Payer: Self-pay | Admitting: Cardiology

## 2024-02-27 ENCOUNTER — Other Ambulatory Visit (HOSPITAL_COMMUNITY): Payer: Self-pay | Admitting: Internal Medicine

## 2024-02-27 DIAGNOSIS — I4891 Unspecified atrial fibrillation: Secondary | ICD-10-CM

## 2024-03-01 ENCOUNTER — Ambulatory Visit: Attending: Cardiology

## 2024-03-01 DIAGNOSIS — Z7901 Long term (current) use of anticoagulants: Secondary | ICD-10-CM | POA: Diagnosis not present

## 2024-03-01 DIAGNOSIS — I4891 Unspecified atrial fibrillation: Secondary | ICD-10-CM | POA: Diagnosis not present

## 2024-03-01 LAB — POCT INR: INR: 3 (ref 2.0–3.0)

## 2024-03-01 NOTE — Patient Instructions (Signed)
 continue taking 1/2 tablet daily (2.5mg ). Continue eating leafy green veggies 2 times per week. Coumadin  [phone] INR in 4 weeks

## 2024-03-01 NOTE — Progress Notes (Signed)
 INR 3.0 Please see anticoagulation encounter continue taking 1/2 tablet daily (2.5mg ). Continue eating leafy green veggies 2 times per week. Coumadin  Clinic-[phone] INR in 4 weeks

## 2024-03-02 DIAGNOSIS — M0579 Rheumatoid arthritis with rheumatoid factor of multiple sites without organ or systems involvement: Secondary | ICD-10-CM | POA: Diagnosis not present

## 2024-03-29 ENCOUNTER — Ambulatory Visit: Attending: Cardiology

## 2024-03-29 DIAGNOSIS — Z7901 Long term (current) use of anticoagulants: Secondary | ICD-10-CM | POA: Insufficient documentation

## 2024-03-29 DIAGNOSIS — I4891 Unspecified atrial fibrillation: Secondary | ICD-10-CM | POA: Insufficient documentation

## 2024-03-29 LAB — POCT INR: INR: 2.6 (ref 2.0–3.0)

## 2024-03-29 NOTE — Progress Notes (Signed)
 INR 2.6 Please see anticoagulation encounter continue taking 1/2 tablet daily (2.5mg ). Continue eating leafy green veggies 2 times per week. Coumadin  Clinic-[phone] INR in 6 weeks

## 2024-03-29 NOTE — Patient Instructions (Signed)
 continue taking 1/2 tablet daily (2.5mg ). Continue eating leafy green veggies 2 times per week. Coumadin  Clinic-[phone] INR in 6 weeks

## 2024-03-30 DIAGNOSIS — M0579 Rheumatoid arthritis with rheumatoid factor of multiple sites without organ or systems involvement: Secondary | ICD-10-CM | POA: Diagnosis not present

## 2024-04-02 ENCOUNTER — Ambulatory Visit

## 2024-04-02 MED ORDER — DENOSUMAB 60 MG/ML ~~LOC~~ SOSY: 60.0000 mg | PREFILLED_SYRINGE | Freq: Once | SUBCUTANEOUS | Status: DC

## 2024-04-13 ENCOUNTER — Ambulatory Visit (INDEPENDENT_AMBULATORY_CARE_PROVIDER_SITE_OTHER): Payer: Medicare Other

## 2024-04-13 DIAGNOSIS — I4891 Unspecified atrial fibrillation: Secondary | ICD-10-CM

## 2024-04-14 ENCOUNTER — Ambulatory Visit (INDEPENDENT_AMBULATORY_CARE_PROVIDER_SITE_OTHER): Payer: PRIVATE HEALTH INSURANCE

## 2024-04-14 VITALS — BP 164/73 | HR 61 | Temp 97.3°F | Resp 16 | Ht 62.5 in | Wt 117.0 lb

## 2024-04-14 DIAGNOSIS — M810 Age-related osteoporosis without current pathological fracture: Secondary | ICD-10-CM | POA: Diagnosis not present

## 2024-04-14 LAB — CUP PACEART REMOTE DEVICE CHECK
Battery Remaining Longevity: 37 mo
Battery Voltage: 2.93 V
Brady Statistic AP VP Percent: 41.28 %
Brady Statistic AP VS Percent: 0 %
Brady Statistic AS VP Percent: 58.72 %
Brady Statistic AS VS Percent: 0.01 %
Brady Statistic RA Percent Paced: 41.17 %
Brady Statistic RV Percent Paced: 99.99 %
Date Time Interrogation Session: 20251230131903
Implantable Lead Connection Status: 753985
Implantable Lead Connection Status: 753985
Implantable Lead Implant Date: 20190429
Implantable Lead Implant Date: 20190429
Implantable Lead Location: 753859
Implantable Lead Location: 753860
Implantable Lead Model: 5076
Implantable Lead Model: 5076
Implantable Pulse Generator Implant Date: 20190429
Lead Channel Impedance Value: 304 Ohm
Lead Channel Impedance Value: 399 Ohm
Lead Channel Impedance Value: 475 Ohm
Lead Channel Impedance Value: 513 Ohm
Lead Channel Pacing Threshold Amplitude: 0.375 V
Lead Channel Pacing Threshold Amplitude: 0.375 V
Lead Channel Pacing Threshold Pulse Width: 0.4 ms
Lead Channel Pacing Threshold Pulse Width: 0.4 ms
Lead Channel Sensing Intrinsic Amplitude: 2.625 mV
Lead Channel Sensing Intrinsic Amplitude: 2.625 mV
Lead Channel Sensing Intrinsic Amplitude: 28.5 mV
Lead Channel Sensing Intrinsic Amplitude: 28.5 mV
Lead Channel Setting Pacing Amplitude: 1.5 V
Lead Channel Setting Pacing Amplitude: 2.5 V
Lead Channel Setting Pacing Pulse Width: 0.4 ms
Lead Channel Setting Sensing Sensitivity: 1.2 mV
Zone Setting Status: 755011
Zone Setting Status: 755011

## 2024-04-14 MED ORDER — DENOSUMAB 60 MG/ML ~~LOC~~ SOSY
60.0000 mg | PREFILLED_SYRINGE | Freq: Once | SUBCUTANEOUS | Status: AC
  Administered 2024-04-14: 60 mg via SUBCUTANEOUS
  Filled 2024-04-14: qty 1

## 2024-04-14 NOTE — Progress Notes (Signed)
 Diagnosis:, Osteoporosis  Provider:  Mannam, Praveen MD  Procedure: Injection  Prolia  (Denosumab ), Dose: 60 mg, Site: subcutaneous, Number of injections: 1  Injection Site(s): Right arm  Post Care: Injection  Discharge: Condition: Good, Destination: Home . AVS Declined  Performed by:  Donny Childes, RN

## 2024-04-16 ENCOUNTER — Ambulatory Visit: Payer: Self-pay | Admitting: Cardiology

## 2024-04-21 NOTE — Progress Notes (Signed)
 Remote PPM Transmission

## 2024-04-28 ENCOUNTER — Ambulatory Visit (INDEPENDENT_AMBULATORY_CARE_PROVIDER_SITE_OTHER): Admitting: Physician Assistant

## 2024-04-28 ENCOUNTER — Encounter (INDEPENDENT_AMBULATORY_CARE_PROVIDER_SITE_OTHER): Payer: Self-pay | Admitting: Physician Assistant

## 2024-04-28 VITALS — BP 163/76 | HR 68 | Ht 62.5 in | Wt 114.0 lb

## 2024-04-28 DIAGNOSIS — R0982 Postnasal drip: Secondary | ICD-10-CM | POA: Diagnosis not present

## 2024-04-28 DIAGNOSIS — R070 Pain in throat: Secondary | ICD-10-CM | POA: Diagnosis not present

## 2024-04-28 DIAGNOSIS — H6121 Impacted cerumen, right ear: Secondary | ICD-10-CM

## 2024-04-28 NOTE — Patient Instructions (Addendum)
 Please use Neil Med bottle or Netti pot as directed.

## 2024-04-28 NOTE — Progress Notes (Signed)
 Dear Dr. Leonel, Here is my assessment for our mutual patient, Elizabeth Estrada. Thank you for allowing me the opportunity to care for your patient. Please do not hesitate to contact me should you have any other questions. Sincerely, Chyrl Cohen PA-C  Otolaryngology Clinic Note Referring provider: Dr. Leonel HPI:  Elizabeth Estrada is a 84 y.o. female kindly referred by Dr. Leonel   Discussed the use of AI scribe software for clinical note transcription with the patient, who gave verbal consent to proceed.  History of Present Illness    84 year old female seen in our office for follow-up evaluation of cerumen impaction.  She was last seen in the office on 10/27/2023.  Below is recap of that encounter.  The patient is a 84 year old female seen in our office for evaluation of cerumen impaction.  The patient notes that she has had decreased hearing out of the right ear, she notes this is similar to previous episodes of cerumen impaction.  She notes some discomfort when laying on the right side, no significant pain at rest, no drainage, no history recurrent ear infections.   Update 04/28/2024-  Since her last office visit she denies any significant ear related complaints.  Hearing is at baseline.  She does note that she had recent diagnosis of sinusitis and was treated with oral antibiotics.  She notes some ongoing persistent postnasal drainage.   Elizabeth Estrada is an 84 year old female with recurrent sinusitis who presents with persistent postnasal drainage.  She is considering over-the-counter therapies such as cetirizine and nasal saline irrigation, as suggested by her sister, but has not yet initiated these treatments. She has not previously used intranasal corticosteroids or nasal saline irrigation.           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 Merrilyn   CARDIAC CATHETERIZATION     CARDIOVASCULAR STRESS TEST  10/19/1999, 2013   EF 97%, NO EVIDENCE OF ISCHEMIA, 2013 NORMAL   CATARACT EXTRACTION W/ INTRAOCULAR LENS  IMPLANT, BILATERAL Bilateral    COLONOSCOPY     CORONARY STENT INTERVENTION N/A 08/08/2017   Procedure: CORONARY STENT INTERVENTION;  Surgeon: Claudene Victory LELON, MD;  Location: [facility];  Service: Cardiovascular;  Laterality: N/A;   DILATATION & CURRETTAGE/HYSTEROSCOPY WITH RESECTOCOPE N/A 07/06/2013   Procedure: DILATATION & CURETTAGE/HYSTEROSCOPY WITH RESECTOCOPE with removal of endometrial lesion;  Surgeon: Ronal Elvie Pinal, MD;  Location: [facility];  Service: Gynecology;  Laterality: N/A;   LEAD REVISION/REPAIR N/A 10/13/2017   Procedure: LEAD REVISION/REPAIR;  Surgeon: Inocencio, Will  Gladis, MD;  Location: [facility];  Service: Cardiovascular;  Laterality: N/A;   MELANOMA EXCISION  1990s?   from back   PACEMAKER IMPLANT N/A 08/11/2017   Procedure: PACEMAKER IMPLANT;  Surgeon: Inocencio Soyla Gladis, MD;  Location: [facility];  Service: Cardiovascular;  Laterality: N/A;   RIGHT/LEFT HEART CATH AND CORONARY ANGIOGRAPHY N/A 08/08/2017   Procedure: RIGHT/LEFT HEART CATH AND CORONARY ANGIOGRAPHY;  Surgeon: Claudene Victory ORN, MD;  Location: [facility];   Service: Cardiovascular;  Laterality: N/A;    Family History  Problem Relation Age of Onset   Coronary artery disease Mother    Pancreatic cancer Father    Heart disease Father        bypass   Hypertension Father    CAD Brother 29       CABG and AVR     Social Connections: Not on file     Current Medications[1]   Physical Exam:   BP (!) 163/76 (BP Location: Left Arm, Patient Position: Sitting, Cuff Size: Normal)   Pulse 68   Ht 5' 2.5 (1.588 m)   Wt 114 lb (51.7 kg)   LMP 04/15/1990   SpO2 96%   BMI 20.52 kg/m   Pertinent Findings  CN II-XII grossly intact Bilateral EAC clear and TM intact with well pneumatized middle ear spaces Anterior rhinoscopy: Septum midline; bilateral inferior turbinates with no hypertrophy No lesions of oral cavity/oropharynx No obviously palpable neck masses/lymphadenopathy/thyromegaly No respiratory distress or stridor    Seprately Identifiable Procedures:  None  Impression & Plans:  Elizabeth Estrada is a 84 y.o. female with the following   Assessment and Plan  Cerumen-  No impaction today    Acute sinusitis Recovering from acute sinusitis with mild postnasal drainage and throat irritation. No active infections. - Recommended cetirizine for allergic contribution to postnasal drainage. - Recommended fluticasone  nasal spray as needed for nasal symptoms. - Recommended regular nasal saline irrigation. - Advised rest and adequate hydration. - Advised follow-up in 6-12 months or sooner if symptoms worsen.           - f/u PRN   Thank you for allowing me the opportunity to care for your patient. Please do not hesitate to contact me should you have any other questions.  Sincerely, Chyrl Cohen PA-C [facility] Phone: [phone] Fax: [phone]  04/28/2024, 3:13 PM        [1]  Current Outpatient Medications:    abatacept  (ORENCIA ) 250 MG injection, Inject 250 mg into the vein every 30 (thirty) days. ,  Disp: , Rfl:    atorvastatin  (LIPITOR ) 40 MG tablet, TAKE 1 TABLET BY MOUTH EVERY DAY, Disp: 90 tablet, Rfl: 3   cetirizine (ZYRTEC) 10 MG tablet, Take 10 mg by mouth daily as needed for allergies., Disp: , Rfl:    denosumab  (PROLIA ) 60 MG/ML SOSY injection, Inject 60 mg into the skin every 6 (six) months., Disp: , Rfl:    escitalopram  (LEXAPRO ) 20 MG tablet, Take 20 mg by mouth at bedtime. , Disp: , Rfl:    fluticasone  (FLONASE ) 50 MCG/ACT nasal spray, Place 1-2 sprays into both nostrils daily as needed for allergies or rhinitis., Disp: , Rfl:    latanoprost (XALATAN) 0.005 % ophthalmic solution, SMARTSIG:1 Drop(s) In Eye(s), Disp: , Rfl:    warfarin (COUMADIN ) 5 MG tablet, TAKE 1/2 TABLET BY MOUTH DAILY OR AS DIRECTED BY ANTICOAGULATION CLINIC., Disp: 45 tablet, Rfl: 1

## 2024-05-09 ENCOUNTER — Encounter: Payer: Self-pay | Admitting: Pharmacist

## 2024-05-10 ENCOUNTER — Ambulatory Visit

## 2024-05-11 ENCOUNTER — Telehealth: Payer: Self-pay | Admitting: Pharmacist

## 2024-05-11 NOTE — Telephone Encounter (Signed)
 Called pt and lmom to reschedule INR appt

## 2024-05-12 ENCOUNTER — Telehealth: Payer: Self-pay

## 2024-05-12 NOTE — Telephone Encounter (Signed)
 Auth Submission: NO AUTH NEEDED Site of care: Site of care: [facility] Payer: Medicare A/B with A[employer] supplement Medication & CPT/J Code(s) submitted: Prolia  (Denosumab ) [id number] Diagnosis Code:  Route of submission (phone, fax, portal):  Phone # Fax # Auth type: Buy/Bill PB Units/visits requested: 60mg  x 2 doses Reference number:  Approval from: 05/12/24 to 05/15/25  Patient prefers to stay at [facility], it is closer to home.

## 2024-05-14 ENCOUNTER — Ambulatory Visit: Attending: Cardiology | Admitting: *Deleted

## 2024-05-14 DIAGNOSIS — Z7901 Long term (current) use of anticoagulants: Secondary | ICD-10-CM | POA: Diagnosis present

## 2024-05-14 DIAGNOSIS — I4891 Unspecified atrial fibrillation: Secondary | ICD-10-CM | POA: Diagnosis present

## 2024-05-14 LAB — POCT INR: INR: 3.5 — AB (ref 2.0–3.0)

## 2024-05-14 NOTE — Progress Notes (Signed)
 Description   INR-3.5; Do not take any warfarin today then continue taking 1/2 tablet daily (2.5mg ). Continue eating leafy green veggies 2 times per week. Coumadin  Clinic-[phone] INR in 5 weeks

## 2024-05-14 NOTE — Patient Instructions (Signed)
 Description   INR-3.5; Do not take any warfarin today then continue taking 1/2 tablet daily (2.5mg ). Continue eating leafy green veggies 2 times per week. Coumadin  Clinic-[phone] INR in 5 weeks

## 2024-06-10 ENCOUNTER — Ambulatory Visit: Admitting: Internal Medicine

## 2024-06-10 ENCOUNTER — Other Ambulatory Visit

## 2024-06-18 ENCOUNTER — Ambulatory Visit

## 2024-08-30 ENCOUNTER — Ambulatory Visit (HOSPITAL_COMMUNITY): Admitting: Internal Medicine

## 2024-10-12 ENCOUNTER — Ambulatory Visit

## 2024-10-27 ENCOUNTER — Ambulatory Visit (INDEPENDENT_AMBULATORY_CARE_PROVIDER_SITE_OTHER): Admitting: Physician Assistant
